# Patient Record
Sex: Female | Born: 1975 | Race: Black or African American | Hispanic: No | Marital: Married | Smoking: Former smoker
Health system: Southern US, Community
[De-identification: ages and names within clinical notes are randomized; demographics above are authoritative.]

## PROBLEM LIST (undated history)

## (undated) DIAGNOSIS — F419 Anxiety disorder, unspecified: Secondary | ICD-10-CM

## (undated) DIAGNOSIS — C50411 Malignant neoplasm of upper-outer quadrant of right female breast: Secondary | ICD-10-CM

## (undated) DIAGNOSIS — Z973 Presence of spectacles and contact lenses: Secondary | ICD-10-CM

## (undated) DIAGNOSIS — I10 Essential (primary) hypertension: Secondary | ICD-10-CM

## (undated) DIAGNOSIS — D649 Anemia, unspecified: Secondary | ICD-10-CM

## (undated) HISTORY — PX: UMBILICAL HERNIA REPAIR: SUR1181

## (undated) HISTORY — DX: Anxiety disorder, unspecified: F41.9

## (undated) HISTORY — PX: WISDOM TOOTH EXTRACTION: SHX21

## (undated) HISTORY — DX: Malignant neoplasm of upper-outer quadrant of right female breast: C50.411

---

## 2011-01-22 ENCOUNTER — Emergency Department (HOSPITAL_COMMUNITY)
Admission: EM | Admit: 2011-01-22 | Discharge: 2011-01-23 | Disposition: A | Discharge status: Home / Self Care | Payer: 59 | Attending: Emergency Medicine | Admitting: Emergency Medicine

## 2011-01-22 ENCOUNTER — Emergency Department (HOSPITAL_COMMUNITY): Payer: 59

## 2011-01-22 DIAGNOSIS — S3981XA Other specified injuries of abdomen, initial encounter: Secondary | ICD-10-CM | POA: Insufficient documentation

## 2011-01-22 DIAGNOSIS — R109 Unspecified abdominal pain: Secondary | ICD-10-CM | POA: Insufficient documentation

## 2011-01-22 DIAGNOSIS — X58XXXA Exposure to other specified factors, initial encounter: Secondary | ICD-10-CM | POA: Insufficient documentation

## 2011-01-22 DIAGNOSIS — Y9375 Activity, martial arts: Secondary | ICD-10-CM | POA: Insufficient documentation

## 2011-01-22 LAB — DIFFERENTIAL
Basophils Absolute: 0 10*3/uL (ref 0.0–0.1)
Eosinophils Relative: 2 % (ref 0–5)
Lymphocytes Relative: 27 % (ref 12–46)
Lymphs Abs: 3.1 10*3/uL (ref 0.7–4.0)
Monocytes Absolute: 0.5 10*3/uL (ref 0.1–1.0)
Monocytes Relative: 4 % (ref 3–12)
Neutro Abs: 7.6 10*3/uL (ref 1.7–7.7)

## 2011-01-22 LAB — CBC
HCT: 33.6 % — ABNORMAL LOW (ref 36.0–46.0)
Hemoglobin: 11.2 g/dL — ABNORMAL LOW (ref 12.0–15.0)
MCHC: 33.3 g/dL (ref 30.0–36.0)
MCV: 88.7 fL (ref 78.0–100.0)
RDW: 12.8 % (ref 11.5–15.5)

## 2011-01-23 ENCOUNTER — Encounter (HOSPITAL_COMMUNITY): Payer: Self-pay

## 2011-01-23 LAB — LIPASE, BLOOD: Lipase: 31 U/L (ref 11–59)

## 2011-01-23 LAB — COMPREHENSIVE METABOLIC PANEL
Alkaline Phosphatase: 55 U/L (ref 39–117)
BUN: 9 mg/dL (ref 6–23)
Creatinine, Ser: 0.72 mg/dL (ref 0.4–1.2)
GFR calc Af Amer: >60 mL/min (ref >60)
Glucose, Bld: 105 mg/dL — ABNORMAL HIGH (ref 70–99)
Potassium: 3.6 mEq/L (ref 3.5–5.1)
Total Bilirubin: 0.2 mg/dL — ABNORMAL LOW (ref 0.3–1.2)
Total Protein: 7 g/dL (ref 6.0–8.3)

## 2011-01-23 LAB — URINALYSIS, ROUTINE W REFLEX MICROSCOPIC
Bilirubin Urine: NEGATIVE
Glucose, UA: NEGATIVE mg/dL
Specific Gravity, Urine: 1.014 (ref 1.005–1.030)

## 2011-01-23 LAB — GC/CHLAMYDIA PROBE AMP, GENITAL
Chlamydia, DNA Probe: NEGATIVE
GC Probe Amp, Genital: NEGATIVE

## 2011-01-23 LAB — URINE MICROSCOPIC-ADD ON

## 2011-01-23 LAB — WET PREP, GENITAL: Yeast Wet Prep HPF POC: NONE SEEN

## 2011-01-23 MED ORDER — IOHEXOL 300 MG/ML  SOLN
100.0000 mL | Freq: Once | INTRAMUSCULAR | Status: AC | PRN
  Administered 2011-01-23: 100 mL via INTRAVENOUS

## 2011-01-24 LAB — URINE CULTURE

## 2012-07-02 IMAGING — CT CT ABD-PELV W/ CM
2 of 4 series · 17 of 46 positions shown, 19 images · IV contrast (agent unspecified)
Comparison: None.

CLINICAL DATA: Pelvic pain.  No injury.

CT ABDOMEN AND PELVIS WITH CONTRAST
TECHNIQUE: Multidetector CT imaging of the abdomen and pelvis was
performed following the standard protocol during bolus
administration of intravenous contrast.
Contrast: 100 ml 2mnipaque-GNN.

[Series 2: rtn ap with st · axial · 0.64mm/px · z∈[-456,-90]mm · 14 of 81 slices shown, 16 images]
[im 4/81  soft-tissue]
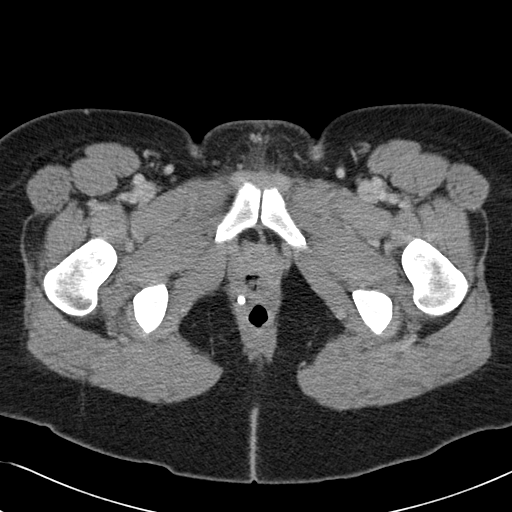
[im 4/81  bone]
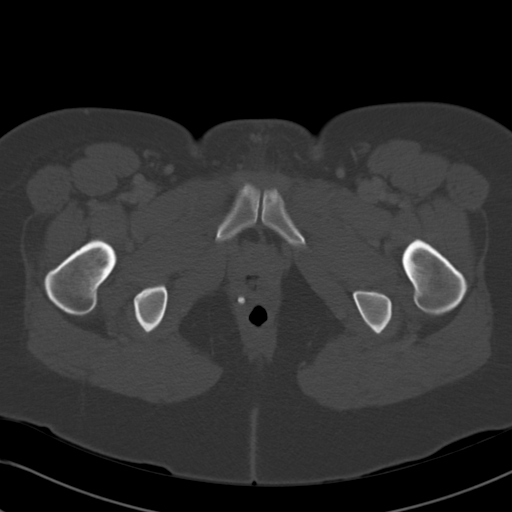
[im 10/81  soft-tissue]
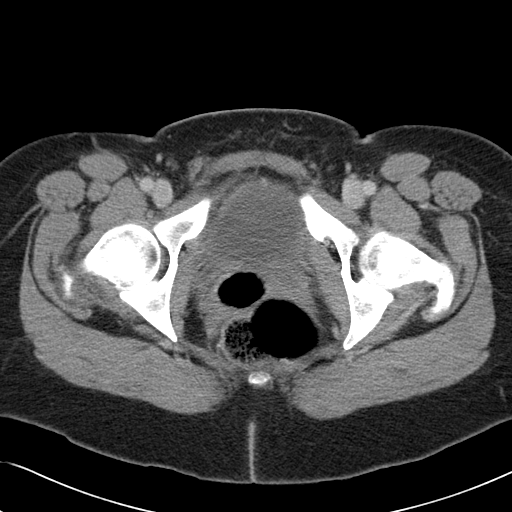
[im 17/81  soft-tissue]
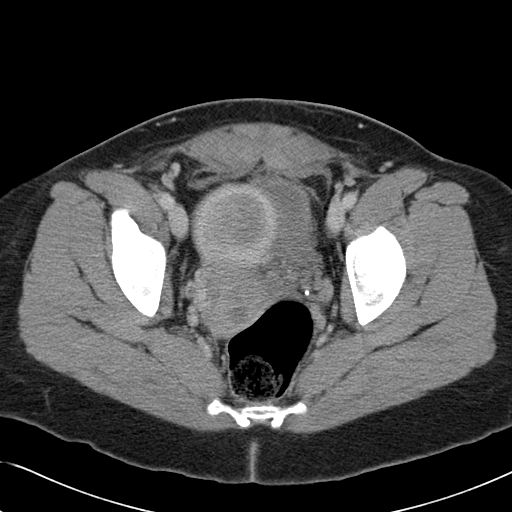
[im 23/81  soft-tissue]
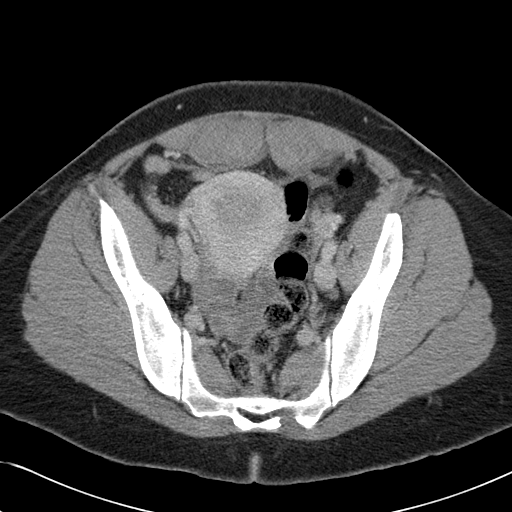
[im 26/81  soft-tissue]
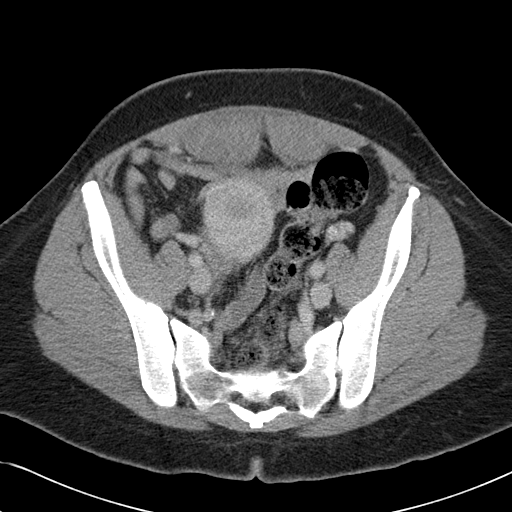
[im 33/81  soft-tissue]
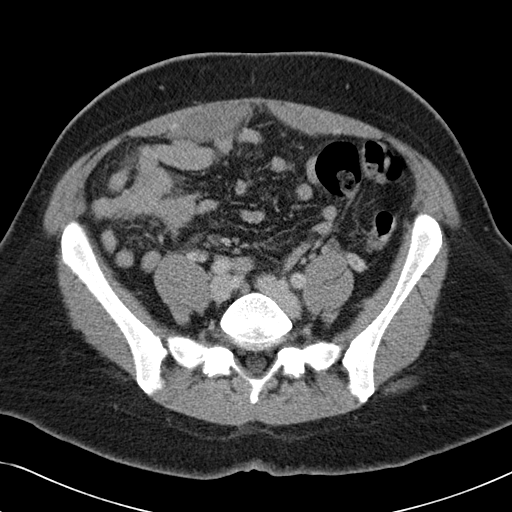
[im 39/81  soft-tissue]
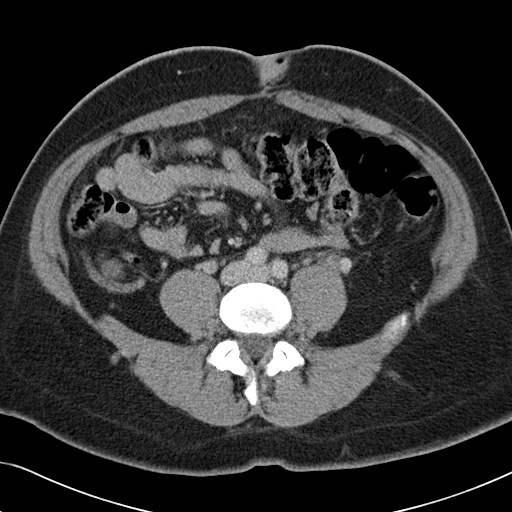
[im 42/81  soft-tissue]
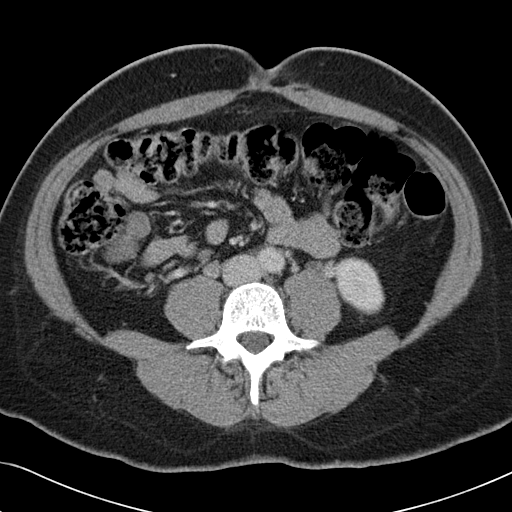
[im 49/81  soft-tissue]
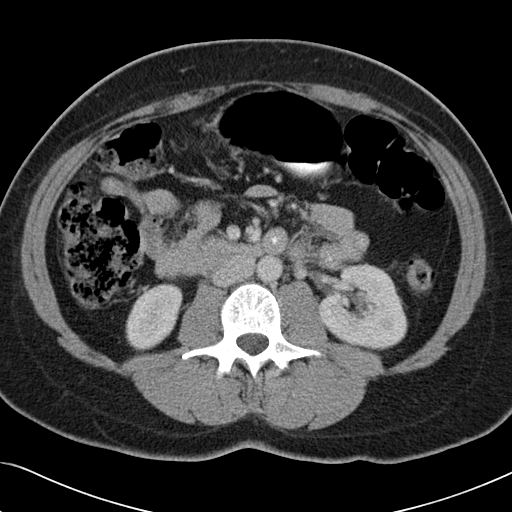
[im 49/81  bone]
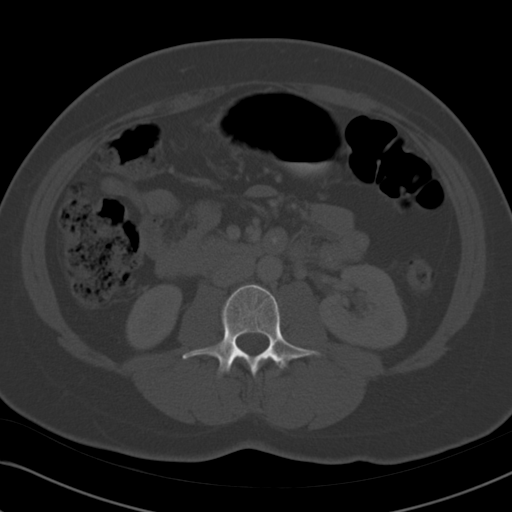
[im 55/81  soft-tissue]
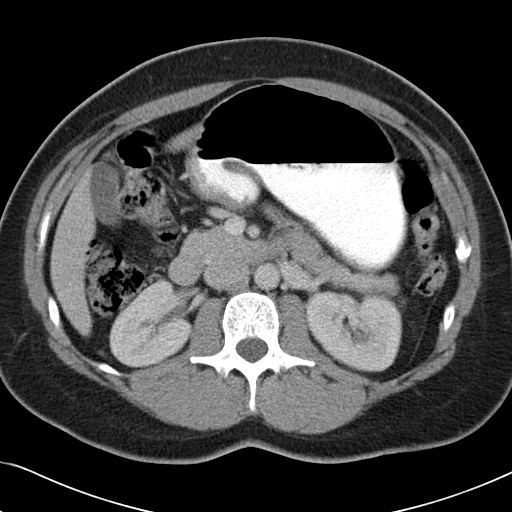
[im 61/81  soft-tissue]
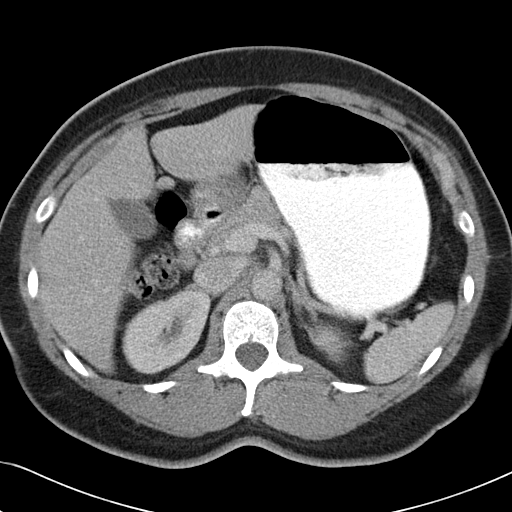
[im 65/81  soft-tissue]
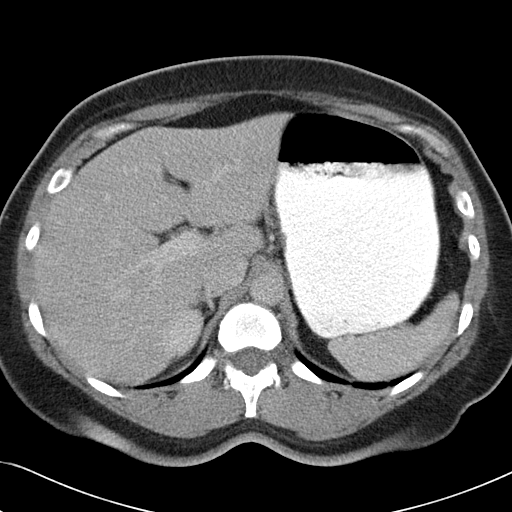
[im 71/81  soft-tissue]
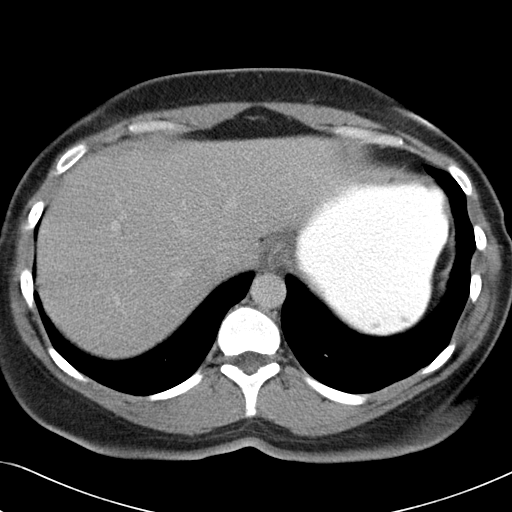
[im 77/81  soft-tissue]
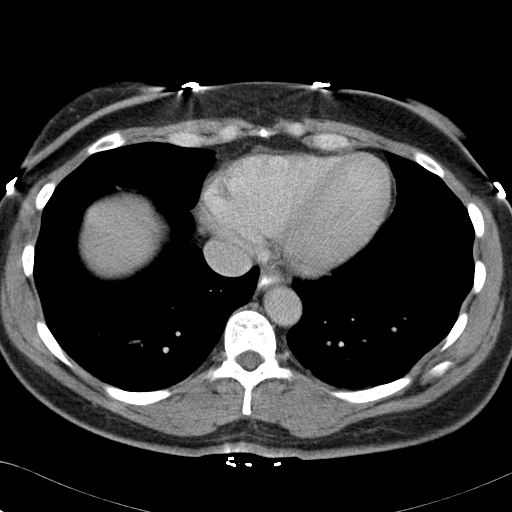

[Series 602: <mpr thick range> · coronal · 0.82mm/px · 3 of 81 slices shown]
[im 27/81  soft-tissue]
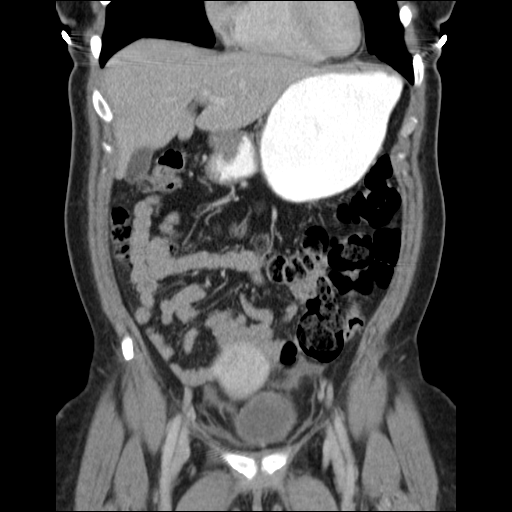
[im 36/81  soft-tissue]
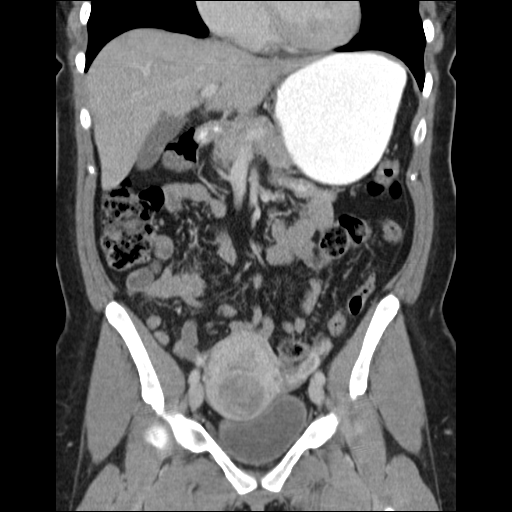
[im 45/81  soft-tissue]
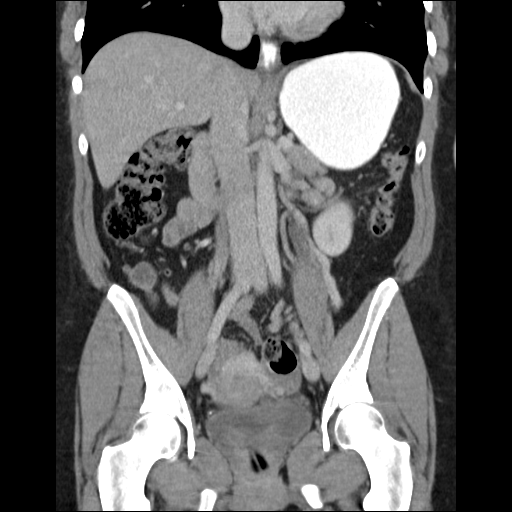

[17 of 46 positions shown; findings below may reference images not displayed]

FINDINGS: Dependent atelectasis at the lung bases.  Liver within
normal limits.  Gallbladder normal.  No calcified gallstones.
Pancreas and spleen unremarkable.  Normal adrenal glands.  Normal
renal enhancement.  The abdominal vasculature appears normal.
Proximal small bowel is normal.  Normal appendix in the right lower
quadrant.  Mild periumbilical fat stranding is present, possibly
representing scar.  Fibroid uterus.  Small amount of free fluid is
present in the anatomic pelvis, likely physiologic.  Sigmoid colon
is within normal limits.  Tampon is present within the vagina.

There is enlargement of the inferior rectus muscles bilaterally,
with stranding extending into the prevesical space.  Findings are
compatible with intramuscular hematoma with adjacent inflammatory
changes.  Bones appear within normal limits. Urinary bladder
normal.
IMPRESSION: 1.  Bilateral inferior rectus abdominus intramuscular hematoma is
with mild fat stranding extending into the prevesical space.
2.  Fibroid uterus.
3.  Thickening of the soft tissues in the region of the umbilicus,
likely representing scarring.

## 2014-03-27 ENCOUNTER — Encounter: Payer: Self-pay | Admitting: Internal Medicine

## 2014-03-27 ENCOUNTER — Ambulatory Visit (INDEPENDENT_AMBULATORY_CARE_PROVIDER_SITE_OTHER): Payer: Self-pay | Admitting: Internal Medicine

## 2014-03-27 VITALS — BP 152/90 | HR 95 | Temp 98.0°F | Resp 18 | Ht 65.0 in | Wt 199.0 lb

## 2014-03-27 DIAGNOSIS — R1013 Epigastric pain: Secondary | ICD-10-CM

## 2014-03-27 MED ORDER — RANITIDINE HCL 300 MG PO TABS
300.0000 mg | ORAL_TABLET | Freq: Every day | ORAL | Status: DC
Start: 2014-03-27 — End: 2015-02-06

## 2014-03-27 NOTE — Progress Notes (Signed)
   Subjective:    Patient ID: Barbara Myers, female    DOB: 10/24/1975, 38 y.o.   MRN: [id number]  HPI 38 year old female complaining of intermittent lower chest and upper abdominal pain for the past 48 hours. This woke her from sleep on Saturday morning and was a cramping discomfort that was not associated with shortness of breath, palpitations, diaphoresis, nausea, vomiting, diarrhea. She does not describe dyspepsia or reflux of acid. No history of heart disease or lung disease Family history of MI and mother She has been in good health recently although she works a stressful job and has been going to class III days a week in addition from 6 to 10 PM. It has been her habit recently to eating tending to directly to bed.   No illnesses/no medications    Review of Systems No fever chills or night sweats No weight loss No difficulty sleeping or except for not enough hours No change in activity level No dyspnea on exertion No GI or GU complaints No peripheral edema    Objective:   Physical Exam BP 152/90  Pulse 95  Temp(Src) 98 F (36.7 C) (Oral)  Resp 18  Ht 5\' 5"  (1.651 m)  Wt 199 lb (90.266 kg)  BMI 33.12 kg/m2  SpO2 99% She is anxious HEENT clear No thyromegaly or lymphadenopathy Chest is clear to auscultation Heart is regular with a 1/6 systolic murmur that disappears with deep inhalation The abdomen is soft nontender nondistended with no organomegaly or masses No peripheral edema Neurological intact Mood is good       Assessment & Plan:  Problem #1 epigastric cramping pain likely related to reflux because of eating at bedtime Problem #2 BMI 33 Problem #3 elevated blood pressure without diagnosis of hypertension  Plan-reassured No eating within 2-3 hours of bedtime Zantac 300 at bedtime for 3 weeks Recheck blood pressure when not anxious- outside world Long-term plan for weight loss needed

## 2014-07-18 ENCOUNTER — Encounter (HOSPITAL_COMMUNITY): Payer: Self-pay | Admitting: *Deleted

## 2014-07-19 ENCOUNTER — Ambulatory Visit (HOSPITAL_COMMUNITY)
Admission: RE | Admit: 2014-07-19 | Discharge: 2014-07-19 | Disposition: A | Discharge status: Home / Self Care | Payer: Medicaid Other | Source: Ambulatory Visit | Attending: Obstetrics and Gynecology | Admitting: Obstetrics and Gynecology

## 2014-07-19 ENCOUNTER — Encounter (HOSPITAL_COMMUNITY): Payer: Self-pay

## 2014-07-19 VITALS — BP 168/94 | Temp 98.9°F | Ht 64.0 in | Wt 198.0 lb

## 2014-07-19 DIAGNOSIS — Z1239 Encounter for other screening for malignant neoplasm of breast: Secondary | ICD-10-CM

## 2014-07-19 HISTORY — DX: Anemia, unspecified: D64.9

## 2014-07-19 NOTE — Progress Notes (Signed)
Patient referred to B[facility]CP by [facility] due to recommending a right breast biopsy. Diagnostic mammogram was completed 07/13/2014 at [facility]. Patient stated she noticed a right breast lump with dimpling around a month ago.  Pap Smear: Pap smear not completed today. Last Pap smear was 07/11/2014 at [facility] and normal with negative HPV. Per patient has no history of an abnormal Pap smear. No Pap smear results in EPIC.  Physical exam: Breasts Breasts symmetrical. No skin abnormalities left breast. Dimpling of right upper outer breast that is more noticeable when sitting up.. No nipple retraction bilateral breasts. No nipple discharge bilateral breasts. No lymphadenopathy. No lumps palpated left breast. Palpated a lump within the right breast at 11 o'clock 5 cm from the nipple. No complaints of pain or tenderness on exam. Referred patient to [facility] for a right breat biopsy per recommendation. Appointment scheduled for Thursday, July 20, 2014 at 1445.     Pelvic/Bimanual No Pap smear completed today since last Pap smear was 07/11/2014. Pap smear not indicated per BCCCP guidelines.

## 2014-07-19 NOTE — Patient Instructions (Signed)
Explained to Barbara Myers that she did not need a Pap smear today due to last Pap smear was 07/11/2014 per patient. Let her know that BCCCP will cover Pap smears and co-testing every 5 years unless has a history of abnormal Pap smears. Referred patient to [facility] for a right breat biopsy per recommendation. Appointment scheduled for Thursday, July 20, 2014 at 1445. Patient aware of appointment and will be there. Barbara Myers verbalized understanding.  Barbara Myers, Arvil Chaco, RN 3:43 PM

## 2014-07-20 ENCOUNTER — Other Ambulatory Visit: Payer: Self-pay | Admitting: Radiology

## 2014-07-21 ENCOUNTER — Other Ambulatory Visit: Payer: Self-pay | Admitting: Radiology

## 2014-07-21 DIAGNOSIS — C50911 Malignant neoplasm of unspecified site of right female breast: Secondary | ICD-10-CM

## 2014-07-24 ENCOUNTER — Telehealth: Payer: Self-pay | Admitting: *Deleted

## 2014-07-24 ENCOUNTER — Encounter: Payer: Self-pay | Admitting: *Deleted

## 2014-07-24 ENCOUNTER — Other Ambulatory Visit: Payer: Self-pay | Admitting: *Deleted

## 2014-07-24 DIAGNOSIS — C50411 Malignant neoplasm of upper-outer quadrant of right female breast: Secondary | ICD-10-CM | POA: Insufficient documentation

## 2014-07-24 HISTORY — DX: Malignant neoplasm of upper-outer quadrant of right female breast: C50.411

## 2014-07-24 NOTE — Telephone Encounter (Signed)
Left vm for pt to return call to discuss [facility] on 07/26/14. Contact information given.

## 2014-07-25 ENCOUNTER — Telehealth: Payer: Self-pay | Admitting: *Deleted

## 2014-07-25 NOTE — Telephone Encounter (Signed)
Barbara Myers from [place] confirmed with pt that [facility] is scheduled for 07/26/14 at 0830. I have left several messages for pt to return call to give further information concerning visit. Contact information given.

## 2014-07-25 NOTE — Telephone Encounter (Signed)
Confirmed [facility] for 07/26/14 at 0830.  Instructions and contact information given.

## 2014-07-26 ENCOUNTER — Ambulatory Visit (HOSPITAL_BASED_OUTPATIENT_CLINIC_OR_DEPARTMENT_OTHER): Payer: Medicaid Other | Admitting: Hematology and Oncology

## 2014-07-26 ENCOUNTER — Encounter (INDEPENDENT_AMBULATORY_CARE_PROVIDER_SITE_OTHER): Payer: Self-pay

## 2014-07-26 ENCOUNTER — Encounter: Payer: Self-pay | Admitting: Hematology and Oncology

## 2014-07-26 ENCOUNTER — Other Ambulatory Visit (INDEPENDENT_AMBULATORY_CARE_PROVIDER_SITE_OTHER): Payer: Self-pay | Admitting: General Surgery

## 2014-07-26 ENCOUNTER — Telehealth: Payer: Self-pay | Admitting: Hematology and Oncology

## 2014-07-26 ENCOUNTER — Encounter: Payer: Self-pay | Admitting: *Deleted

## 2014-07-26 ENCOUNTER — Other Ambulatory Visit (HOSPITAL_BASED_OUTPATIENT_CLINIC_OR_DEPARTMENT_OTHER): Payer: Medicaid Other

## 2014-07-26 ENCOUNTER — Ambulatory Visit
Admission: RE | Admit: 2014-07-26 | Discharge: 2014-07-26 | Disposition: A | Discharge status: Home / Self Care | Payer: Medicaid Other | Source: Ambulatory Visit | Attending: Radiation Oncology | Admitting: Radiation Oncology

## 2014-07-26 ENCOUNTER — Ambulatory Visit: Payer: 59

## 2014-07-26 VITALS — BP 164/91 | HR 59 | Temp 97.8°F | Resp 20 | Ht 65.0 in | Wt 192.7 lb

## 2014-07-26 DIAGNOSIS — C773 Secondary and unspecified malignant neoplasm of axilla and upper limb lymph nodes: Secondary | ICD-10-CM

## 2014-07-26 DIAGNOSIS — Z170 Estrogen receptor positive status [ER+]: Secondary | ICD-10-CM

## 2014-07-26 DIAGNOSIS — D500 Iron deficiency anemia secondary to blood loss (chronic): Secondary | ICD-10-CM

## 2014-07-26 DIAGNOSIS — C50411 Malignant neoplasm of upper-outer quadrant of right female breast: Secondary | ICD-10-CM

## 2014-07-26 DIAGNOSIS — N920 Excessive and frequent menstruation with regular cycle: Secondary | ICD-10-CM

## 2014-07-26 LAB — COMPREHENSIVE METABOLIC PANEL (CC13)
ALBUMIN: 3.9 g/dL (ref 3.5–5.0)
ALK PHOS: 46 U/L (ref 40–150)
ALT: 14 U/L (ref 0–55)
AST: 14 U/L (ref 5–34)
Anion Gap: 9 mEq/L (ref 3–11)
BUN: 7.2 mg/dL (ref 7.0–26.0)
CO2: 22 mEq/L (ref 22–29)
Calcium: 8.5 mg/dL (ref 8.4–10.4)
Chloride: 108 mEq/L (ref 98–109)
Creatinine: 0.8 mg/dL (ref 0.6–1.1)
GLUCOSE: 88 mg/dL (ref 70–140)
POTASSIUM: 3.8 meq/L (ref 3.5–5.1)
Sodium: 139 mEq/L (ref 136–145)
Total Bilirubin: 0.42 mg/dL (ref 0.20–1.20)
Total Protein: 6.8 g/dL (ref 6.4–8.3)

## 2014-07-26 LAB — CBC WITH DIFFERENTIAL/PLATELET
BASO%: 0.9 % (ref 0.0–2.0)
BASOS ABS: 0 10*3/uL (ref 0.0–0.1)
EOS ABS: 0.1 10*3/uL (ref 0.0–0.5)
EOS%: 1.3 % (ref 0.0–7.0)
HEMATOCRIT: 24.3 % — AB (ref 34.8–46.6)
HEMOGLOBIN: 6.9 g/dL — AB (ref 11.6–15.9)
LYMPH%: 28 % (ref 14.0–49.7)
MCH: 21.4 pg — ABNORMAL LOW (ref 25.1–34.0)
MCHC: 28.6 g/dL — ABNORMAL LOW (ref 31.5–36.0)
MCV: 74.9 fL — AB (ref 79.5–101.0)
MONO#: 0.3 10*3/uL (ref 0.1–0.9)
MONO%: 4.7 % (ref 0.0–14.0)
NEUT%: 65.1 % (ref 38.4–76.8)
NEUTROS ABS: 3.5 10*3/uL (ref 1.5–6.5)
PLATELETS: 598 10*3/uL — AB (ref 145–400)
RBC: 3.24 10*6/uL — ABNORMAL LOW (ref 3.70–5.45)
RDW: 33.4 % — AB (ref 11.2–14.5)
WBC: 5.4 10*3/uL (ref 3.9–10.3)
lymph#: 1.5 10*3/uL (ref 0.9–3.3)

## 2014-07-26 NOTE — Assessment & Plan Note (Signed)
Secondary to heavy menstrual bleeding. Patient received DepoProgesterone injection. I recommended doing ovarian suppression with Zoladex. Her hemoglobin today is 6.9 and hence I recommended giving IV iron so that her hemoglobin could improve by the time we get started with chemotherapy. She will stop oral iron treatments.

## 2014-07-26 NOTE — Progress Notes (Signed)
Barbara Myers is a very pleasant 38 y.o.Marland Kitchen female from [place], [place] with newly diagnosed grade 3 invasive ductal carcinoma of the right breast. Biopsy results revealed the tumor's hormone status as ER positive, PR positive, and HER2/neu equivocal. Ki67 is 40%.  A biopsy of a right axillary lymph node also revealed metastatic breast cancer.   She presents today with her husband to the [facility]) for treatment consideration and recommendations from the breast surgeon, radiation oncologist, and medical oncologist.     I briefly met with Barbara Myers and her husband during her [facility] visit today. We discussed the purpose of the Survivorship Clinic, which will include monitoring for recurrence, coordinating completion of age and gender-appropriate cancer screenings, promotion of overall wellness, as well as managing potential late/long-term side effects of anti-cancer treatments.     As of today, the treatment plan for Barbara Myers will likely include surgery, chemotherapy, and radiation therapy.  She will also meet with the Genetics Counselor due to her family history of breast cancer. The intent of treatment for Barbara Myers is cure, therefore she will be eligible for the Survivorship Clinic upon her completion of treatment.  Her survivorship care plan (SCP) document will be drafted and updated throughout the course of her treatment trajectory. She will receive the SCP in an office visit with myself in the Survivorship Clinic once she has completed treatment.   Barbara Myers was encouraged to ask questions and all questions were answered to her satisfaction.  She was given my business card and encouraged to contact me with any concerns regarding survivorship.  I look forward to  participating in her care.   Mike Craze, NP

## 2014-07-26 NOTE — Progress Notes (Signed)
Radiation Oncology         [phone]) [phone] ________________________________  Initial outpatient Consultation - Date: 07/26/2014   Name: Barbara Myers MRN: [id number]   DOB: 02/10/1976  REFERRING PHYSICIAN: Emelia Loron, MD  DIAGNOSIS:    ICD-9-CM ICD-10-CM   1. Breast cancer of upper-outer quadrant of right female breast 174.4 C50.411     STAGE: Breast cancer of upper-outer quadrant of right female breast   Staging form: Breast, AJCC 7th Edition     Clinical stage from 07/26/2014: Stage IIB (T2, N1, M0) - Unsigned       Staging comments: Staged at breast conference on 12.16.15  HISTORY OF PRESENT ILLNESS::Barbara Myers is a 38 y.o. female who felt a right breast mass. A 2.0 cm lesion was seen on ultrasound. A right axillary lymph node showed cortical thickening. MRI is pending. Biopsy showed Grade 3 invasive ductal cancer which was ER+PR+Her2 equivocal. Ki67 was 40%.  The right axillary lymph node was positive. She is doing well from her biopsy. She is accompanied by her husband. 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 She is GxP2 with her first birth at 80. She is still menstruating.   PREVIOUS RADIATION THERAPY: No  FAMILY HISTORY:  Family History  Problem Relation Age of Onset  . Hypertension Mother   . Breast cancer Maternal Grandmother   . Lung cancer Maternal Aunt     SOCIAL HISTORY:  History  Substance Use Topics  . Smoking status: Never Smoker   . Smokeless tobacco: Never Used  . Alcohol Use: Yes     Comment: ocassionally    REVIEW OF SYSTEMS:  A 15 point review of systems is documented in the electronic medical record. This was obtained by the nursing staff. However, I reviewed this with the patient to discuss relevant findings and make appropriate changes.  Pertinent positives are included in the chart.   PHYSICAL EXAM: There were no vitals filed for this visit.. . Pleasant female in no distress. No palpable cervical,  supraclavicular or axillary adenopathy. Palpable mass/biopsy change in the right breast (upper outer quadrant). No palpable masses in the left breast. She is alert and oriented.   IMPRESSION: T2N1 IDC of the right breast.  PLAN: I spoke to the patient today regarding her diagnosis and options for treatment. We discussed the equivalence in terms of survival and local failure between mastectomy and breast conservation. We discussed the role of radiation in decreasing local failures in patients who undergo lumpectomy. We discussed the process of simulation and the placement tattoos. We discussed 6 weeks of treatment as an outpatient. We discussed the possibility of asymptomatic lung damage. We discussed the low likelihood of secondary malignancies. We discussed the possible side effects including but not limited to skin redness, fatigue, permanent skin darkening, and breast swelling.    She is undergoing neoadjuvant treatment for breast cancer and will be a candidate for the lymph node studies looking at radiation for positive/negative sentinel nodes. I discussed this with her briefly and introduced the concept.   We are waiting on her HER2 results to be re-run on the breast specimen.  She met with our survivorship navigator, breast cancer navigators and patient/family support team.   I will see her back at the end of chemotherapy/pre surgery for discussion of clinical trials.   I spent 60 minutes  face to face with the patient and more than 50% of that time was spent in counseling and/or coordination of  care.   ------------------------------------------------  Thea Silversmith, MD

## 2014-07-26 NOTE — Progress Notes (Signed)
[facility] CONSULT NOTE  Patient Care Team: No Pcp Per Patient as PCP - General (General Practice) Rolm Bookbinder, MD as Consulting Physician (General Surgery) Rulon Eisenmenger, MD as Consulting Physician (Hematology and Oncology) Thea Silversmith, MD as Consulting Physician (Radiation Oncology) Trinda Pascal, NP as Nurse Practitioner (Nurse Practitioner)  CHIEF COMPLAINTS/PURPOSE OF CONSULTATION:  Newly diagnosed breast cancer  HISTORY OF PRESENTING ILLNESS:  Barbara Myers 38 y.o. female is here because of recent diagnosis of right breast cancer. Patient felt a palpable right breast mass in the upper outer quadrant which led to a mammogram and ultrasound that revealed her to Sinemet or mass effect 10:00 position. She also had a right enlarged axillary lymph node. Both a breast mass in the axilla were biopsied and both showed grade 3 invasive ductal carcinoma. The tumor was ER/PR positive HER-2 was equivocal with a Ki-67 of 40%. She has continued to underwent MRI on Friday. She was presented this morning in the multidisciplinary tumor board and she is here today to discuss the treatment options.  I reviewed her records extensively and collaborated the history with the patient.  SUMMARY OF ONCOLOGIC HISTORY:   Breast cancer of upper-outer quadrant of right female breast   07/13/2014 Mammogram indeterminate mass in the right breast by ultrasound measured 2 cm at 10:00 position   07/20/2014 Initial Diagnosis right breast biopsy: Invasive ductal carcinoma, 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, grade 3,ER/PR positive, HER-2 equivocal ratio 1.55, gene copy #4.35 (being repeated), Ki-67 40%    In terms of breast cancer risk profile:  She menarched at early age of 18  She had 2 pregnancy, her first child was born at age 27  She has received birth control pills for approximately few years.  She was never exposed to fertility medications  or hormone replacement therapy.  She has family history of Breast/GYN/GI cancer Maternal grandmother 76s with breast cancer in maternal aunt 65s with lung cancer  MEDICAL HISTORY:  Past Medical History  Diagnosis Date  . Anemia   . Breast cancer of upper-outer quadrant of right female breast 07/24/2014  . Anxiety     SURGICAL HISTORY: History reviewed. No pertinent past surgical history.  SOCIAL HISTORY: History   Social History  . Marital Status: Married    Spouse Name: N/A    Number of Children: N/A  . Years of Education: N/A   Occupational History  . Not on file.   Social History Main Topics  . Smoking status: Never Smoker   . Smokeless tobacco: Never Used  . Alcohol Use: Yes     Comment: ocassionally  . Drug Use: No  . Sexual Activity: Yes    Birth Control/ Protection: Injection   Other Topics Concern  . Not on file   Social History Narrative    FAMILY HISTORY: Family History  Problem Relation Age of Onset  . Hypertension Mother   . Breast cancer Maternal Grandmother   . Lung cancer Maternal Aunt     ALLERGIES:  has No Known Allergies.  MEDICATIONS:  Current Outpatient Prescriptions  Medication Sig Dispense Refill  . ferrous fumarate (HEMOCYTE - 106 MG FE) 325 (106 FE) MG TABS tablet Take 2 tablets by mouth daily.    . medroxyPROGESTERone (DEPO-PROVERA) 150 MG/ML injection Inject 150 mg into the muscle every 3 (three) months.    . ranitidine (ZANTAC) 300 MG tablet Take 1 tablet (300 mg total) by mouth at bedtime. 30 tablet 0  No current facility-administered medications for this visit.    REVIEW OF SYSTEMS:   Constitutional: Denies fevers, chills or abnormal night sweats Eyes: Denies blurriness of vision, double vision or watery eyes Ears, nose, mouth, throat, and face: Denies mucositis or sore throat Respiratory: Denies cough, dyspnea or wheezes Cardiovascular: Denies palpitation, chest discomfort or lower extremity swelling Gastrointestinal:   Denies nausea, heartburn or change in bowel habits Skin: Denies abnormal skin rashes Lymphatics: Denies new lymphadenopathy or easy bruising Neurological:Denies numbness, tingling or new weaknesses Behavioral/Psych: Mood is stable, no new changes  Breast: palpable lump All other systems were reviewed with the patient and are negative.  PHYSICAL EXAMINATION: ECOG PERFORMANCE STATUS: 0 - Asymptomatic  Filed Vitals:   07/26/14 0910  BP: 164/91  Pulse: 59  Temp: 97.8 F (36.6 C)  Resp: 20   Filed Weights   07/26/14 0910  Weight: 192 lb 11.2 oz (87.408 kg)    GENERAL:alert, no distress and comfortable SKIN: skin color, texture, turgor are normal, no rashes or significant lesions EYES: normal, conjunctiva are pink and non-injected, sclera clear OROPHARYNX:no exudate, no erythema and lips, buccal mucosa, and tongue normal  NECK: supple, thyroid normal size, non-tender, without nodularity LYMPH:  no palpable lymphadenopathy in the cervical, axillary or inguinal LUNGS: clear to auscultation and percussion with normal breathing effort HEART: regular rate & rhythm and no murmurs and no lower extremity edema ABDOMEN:abdomen soft, non-tender and normal bowel sounds Musculoskeletal:no cyanosis of digits and no clubbing  PSYCH: alert & oriented x 3 with fluent speech NEURO: no focal motor/sensory deficits BREAST:palpable lump in the right breast. No palpable axillary or supraclavicular lymphadenopathy  LABORATORY DATA:  I have reviewed the data as listed Lab Results  Component Value Date   WBC 5.4 07/26/2014   HGB 6.9* 07/26/2014   HCT 24.3* 07/26/2014   MCV 74.9* 07/26/2014   PLT 598* 07/26/2014   Lab Results  Component Value Date   NA 139 07/26/2014   K 3.8 07/26/2014   CL 104 01/22/2011   CO2 22 07/26/2014    RADIOGRAPHIC STUDIES: I have personally reviewed the radiological reports and agreed with the findings in the report. Results summarized as above  ASSESSMENT AND  PLAN:  Breast cancer of upper-outer quadrant of right female breast Right breast invasive ductal carcinoma 2 cm by ultrasound one axillary lymph node biopsy positive, ER positive, PR positive, HER-2 equivocal, grade 3, Ki-67 40%, T2 N1 M0 stage IIB clinical stage  Pathology and radiology counseling:Discussed with the patient, the details of pathology including the type of breast cancer,the clinical staging, the significance of ER, PR and HER-2/neu receptors and the implications for treatment. After reviewing the pathology in detail, we proceeded to discuss the different treatment options between surgery, radiation, chemotherapy, antiestrogen therapies.  Recommendation: 1. Neoadjuvant chemotherapy with dose dense Adriamycin and Cytoxan every 2 weeks for 4 treatments followed by Taxol weekly 12assuming that HER-2 is negative. 2. Patient will need echocardiogram, chemotherapy class, port placement ( to be done on 08/08/2014) 3. Plan to start chemotherapy 08/09/2014 4. Genetics counseling  Chemotherapy counseling:I have discussed the risks and benefits of chemotherapy including the risks of nausea/ vomiting, risk of infection from low WBC count, fatigue due to chemo or anemia, bruising or bleeding due to low platelets, mouth sores, loss/ change in taste and decreased appetite. Liver and kidney function will be monitored through out chemotherapy as abnormalities in liver and kidney function may be a side effect of treatment. Cardiac dysfunction due to Adriamycin  was discussed in detail. Risk of permanent bone marrow dysfunction and leukemia   I also offered her participation in PREVENT clinical trial PREVENT trial: [facility] [phone]  Newly diagnosed breast cancer Stages 1-3 scheduled to receive anthracycline randomized to atorvastatin 40 mg or placebo once daily for 24 months along with 3 cardiac MRIs or 2 years paid for by the trial. I discussed the risks and benefits of atorvastatin including the risk of  myopathy and elevation of liver function tests. I explained the randomization process and patient is interested in the trial. I provided her with literature to read and provided her with the contact with the clinical trials nurse to ask any questions regarding the trial.    Iron deficiency anemia due to chronic blood loss Secondary to heavy menstrual bleeding. Patient received DepoProgesterone injection. I recommended doing ovarian suppression with Zoladex. Her hemoglobin today is 6.9 and hence I recommended giving IV iron so that her hemoglobin could improve by the time we get started with chemotherapy. She will stop oral iron treatments.    Rulon Eisenmenger, MD 07/26/2014 12:19 PM

## 2014-07-26 NOTE — Assessment & Plan Note (Signed)
Right breast invasive ductal carcinoma 2 cm by ultrasound one axillary lymph node biopsy positive, ER positive, PR positive, HER-2 equivocal, grade 3, Ki-67 40%, T2 N1 M0 stage IIB clinical stage  Pathology and radiology counseling:Discussed with the patient, the details of pathology including the type of breast cancer,the clinical staging, the significance of ER, PR and HER-2/neu receptors and the implications for treatment. After reviewing the pathology in detail, we proceeded to discuss the different treatment options between surgery, radiation, chemotherapy, antiestrogen therapies.  Recommendation: 1. Neoadjuvant chemotherapy with dose dense Adriamycin and Cytoxan every 2 weeks for 4 treatments followed by Taxol weekly 12assuming that HER-2 is negative. 2. Patient will need echocardiogram, chemotherapy class, port placement ( to be done on 08/08/2014) 3. Plan to start chemotherapy 08/09/2014 4. Genetics counseling  Chemotherapy counseling:I have discussed the risks and benefits of chemotherapy including the risks of nausea/ vomiting, risk of infection from low WBC count, fatigue due to chemo or anemia, bruising or bleeding due to low platelets, mouth sores, loss/ change in taste and decreased appetite. Liver and kidney function will be monitored through out chemotherapy as abnormalities in liver and kidney function may be a side effect of treatment. Cardiac dysfunction due to Adriamycin was discussed in detail. Risk of permanent bone marrow dysfunction and leukemia   I also offered her participation in PREVENT clinical trial PREVENT trial: [id number]  Newly diagnosed breast cancer Stages 1-3 scheduled to receive anthracycline randomized to atorvastatin 40 mg or placebo once daily for 24 months along with 3 cardiac MRIs or 2 years paid for by the trial. I discussed the risks and benefits of atorvastatin including the risk of myopathy and elevation of liver function tests. I explained the  randomization process and patient is interested in the trial. I provided her with literature to read and provided her with the contact with the clinical trials nurse to ask any questions regarding the trial.   

## 2014-07-26 NOTE — Progress Notes (Signed)
Checked in new patient with no issues prior to seeing the dr. She will see Gabriel Cirri for BCCCP. She is also applying for comm insurance. I advised her of reg medicaid also. She will call Len[facility] if asst is needed. She has not traveled and has her appt card.

## 2014-07-26 NOTE — Telephone Encounter (Signed)
, °

## 2014-07-26 NOTE — Progress Notes (Signed)
[place] Psychosocial Distress Screening Clinical Social Work  Patient completed distress screening protocol and scored a 8 on the Psychosocial Distress Thermometer which indicates moderate distress. Clinical Social Worker met with patient and patients husband to assess for distress and other psychosocial needs.  Patient stated her distress level had decreased after meeting with the treatment team and getting more information on her treatment plan.  Patient identified financial stress and her children as her biggest concern.  Patient has 2 teenage children who are Autistic and patient has concerns for how they will respond to her diagnosis.  Patient expressed experiencing financial stress prior to her diagnosis and concerns for how treatment expenses will effect her.  CSW and patient discussed financial resources and the [employer].  Patient plans to contact the financial advocate to initiate the application process.  CSW encouraged patient to call once she receives her chemo class appointment to schedule a time to review additional resources with CSW.  CSW informed patient of the support team and support services at [facility], and patient was agreeable to an alight guide.  CSW encouraged patient to call with any additional questions or concerns.      ONCBCN DISTRESS SCREENING 07/26/2014  Screening Type Initial Screening  Distress experienced in past week (1-10) 8  Practical problem type Insurance;Work/school;Childcare  Family Problem type Children  Emotional problem type Nervousness/Anxiety;Adjusting to illness  Spiritual/Religous concerns type Loss of sense of purpose  Information Concerns Type Lack of info about diagnosis;Lack of info about treatment;Lack of info about complementary therapy choices  Physical Problem type Sleep/insomnia;Loss of appetitie  Physician notified of physical symptoms Yes  Referral to clinical social work Yes  Referral to financial advocate Yes  Referral to support  programs Yes

## 2014-07-27 ENCOUNTER — Telehealth: Payer: Self-pay | Admitting: *Deleted

## 2014-07-27 ENCOUNTER — Ambulatory Visit: Payer: Self-pay

## 2014-07-27 ENCOUNTER — Ambulatory Visit (HOSPITAL_BASED_OUTPATIENT_CLINIC_OR_DEPARTMENT_OTHER): Payer: Medicaid Other

## 2014-07-27 ENCOUNTER — Encounter: Payer: Self-pay | Admitting: *Deleted

## 2014-07-27 ENCOUNTER — Encounter: Payer: Self-pay | Admitting: Hematology and Oncology

## 2014-07-27 DIAGNOSIS — C773 Secondary and unspecified malignant neoplasm of axilla and upper limb lymph nodes: Secondary | ICD-10-CM

## 2014-07-27 DIAGNOSIS — C50411 Malignant neoplasm of upper-outer quadrant of right female breast: Secondary | ICD-10-CM

## 2014-07-27 DIAGNOSIS — D500 Iron deficiency anemia secondary to blood loss (chronic): Secondary | ICD-10-CM

## 2014-07-27 DIAGNOSIS — Z5111 Encounter for antineoplastic chemotherapy: Secondary | ICD-10-CM

## 2014-07-27 DIAGNOSIS — N920 Excessive and frequent menstruation with regular cycle: Secondary | ICD-10-CM

## 2014-07-27 MED ORDER — GOSERELIN ACETATE 3.6 MG ~~LOC~~ IMPL
3.6000 mg | DRUG_IMPLANT | Freq: Once | SUBCUTANEOUS | Status: AC
  Administered 2014-07-27: 3.6 mg via SUBCUTANEOUS
  Filled 2014-07-27: qty 3.6

## 2014-07-27 MED ORDER — FERUMOXYTOL INJECTION 510 MG/17 ML
510.0000 mg | Freq: Once | INTRAVENOUS | Status: AC
  Administered 2014-07-27: 510 mg via INTRAVENOUS
  Filled 2014-07-27: qty 17

## 2014-07-27 NOTE — Progress Notes (Signed)
Pt is approved for the $1000 Alight grant.  

## 2014-07-27 NOTE — Progress Notes (Signed)
Monitored patient 30 minutes post Feraheme infusion with no complications.

## 2014-07-27 NOTE — Telephone Encounter (Signed)
Spoke to pt concerning [place] from 07/26/14. Informed pt of negative her2neu result on breast specimen from core bx. Pt denies questions or concerns regarding dx or treatment care plan. Confirmed port and future appt. Encourage pt to call with needs. Received verbal understanding. Contact information given.

## 2014-07-27 NOTE — Patient Instructions (Addendum)
Ferumoxytol injection What is this medicine? FERUMOXYTOL is an iron complex. Iron is used to make healthy red blood cells, which carry oxygen and nutrients throughout the body. This medicine is used to treat iron deficiency anemia in people with chronic kidney disease. This medicine may be used for other purposes; ask your health care provider or pharmacist if you have questions. COMMON BRAND NAME(S): Feraheme What should I tell my health care provider before I take this medicine? They need to know if you have any of these conditions: -anemia not caused by low iron levels -high levels of iron in the blood -magnetic resonance imaging (MRI) test scheduled -an unusual or allergic reaction to iron, other medicines, foods, dyes, or preservatives -pregnant or trying to get pregnant -breast-feeding How should I use this medicine? This medicine is for injection into a vein. It is given by a health care professional in a hospital or clinic setting. Talk to your pediatrician regarding the use of this medicine in children. Special care may be needed. Overdosage: If you think you've taken too much of this medicine contact a poison control center or emergency room at once. Overdosage: If you think you have taken too much of this medicine contact a poison control center or emergency room at once. NOTE: This medicine is only for you. Do not share this medicine with others. What if I miss a dose? It is important not to miss your dose. Call your doctor or health care professional if you are unable to keep an appointment. What may interact with this medicine? This medicine may interact with the following medications: -other iron products This list may not describe all possible interactions. Give your health care provider a list of all the medicines, herbs, non-prescription drugs, or dietary supplements you use. Also tell them if you smoke, drink alcohol, or use illegal drugs. Some items may interact with your  medicine. What should I watch for while using this medicine? Visit your doctor or healthcare professional regularly. Tell your doctor or healthcare professional if your symptoms do not start to get better or if they get worse. You may need blood work done while you are taking this medicine. You may need to follow a special diet. Talk to your doctor. Foods that contain iron include: whole grains/cereals, dried fruits, beans, or peas, leafy green vegetables, and organ meats (liver, kidney). What side effects may I notice from receiving this medicine? Side effects that you should report to your doctor or health care professional as soon as possible: -allergic reactions like skin rash, itching or hives, swelling of the face, lips, or tongue -breathing problems -changes in blood pressure -feeling faint or lightheaded, falls -fever or chills -flushing, sweating, or hot feelings -swelling of the ankles or feet Side effects that usually do not require medical attention (Report these to your doctor or health care professional if they continue or are bothersome.): -diarrhea -headache -nausea, vomiting -stomach pain This list may not describe all possible side effects. Call your doctor for medical advice about side effects. You may report side effects to FDA at 1-800-FDA-1088. Where should I keep my medicine? This drug is given in a hospital or clinic and will not be stored at home. NOTE: This sheet is a summary. It may not cover all possible information. If you have questions about this medicine, talk to your doctor, pharmacist, or health care provider.  2015, Elsevier/Gold Standard. (2012-03-12 15:23:36) Goserelin injection What is this medicine? GOSERELIN (GOE se rel in) is similar to  a hormone found in the body. It lowers the amount of sex hormones that the body makes. Men will have lower testosterone levels and women will have lower estrogen levels while taking this medicine. In men, this  medicine is used to treat prostate cancer; the injection is either given once per month or once every 12 weeks. A once per month injection (only) is used to treat women with endometriosis, dysfunctional uterine bleeding, or advanced breast cancer. This medicine may be used for other purposes; ask your health care provider or pharmacist if you have questions. COMMON BRAND NAME(S): Zoladex What should I tell my health care provider before I take this medicine? They need to know if you have any of these conditions (some only apply to women): -diabetes -heart disease or previous heart attack -high blood pressure -high cholesterol -kidney disease -osteoporosis or low bone density -problems passing urine -spinal cord injury -stroke -tobacco smoker -an unusual or allergic reaction to goserelin, hormone therapy, other medicines, foods, dyes, or preservatives -pregnant or trying to get pregnant -breast-feeding How should I use this medicine? This medicine is for injection under the skin. It is given by a health care professional in a hospital or clinic setting. Men receive this injection once every 4 weeks or once every 12 weeks. Women will only receive the once every 4 weeks injection. Talk to your pediatrician regarding the use of this medicine in children. Special care may be needed. Overdosage: If you think you have taken too much of this medicine contact a poison control center or emergency room at once. NOTE: This medicine is only for you. Do not share this medicine with others. What if I miss a dose? It is important not to miss your dose. Call your doctor or health care professional if you are unable to keep an appointment. What may interact with this medicine? -female hormones like estrogen -herbal or dietary supplements like black cohosh, chasteberry, or DHEA -female hormones like testosterone -prasterone This list may not describe all possible interactions. Give your health care provider  a list of all the medicines, herbs, non-prescription drugs, or dietary supplements you use. Also tell them if you smoke, drink alcohol, or use illegal drugs. Some items may interact with your medicine. What should I watch for while using this medicine? Visit your doctor or health care professional for regular checks on your progress. Your symptoms may appear to get worse during the first weeks of this therapy. Tell your doctor or healthcare professional if your symptoms do not start to get better or if they get worse after this time. Your bones may get weaker if you take this medicine for a long time. If you smoke or frequently drink alcohol you may increase your risk of bone loss. A family history of osteoporosis, chronic use of drugs for seizures (convulsions), or corticosteroids can also increase your risk of bone loss. Talk to your doctor about how to keep your bones strong. This medicine should stop regular monthly menstration in women. Tell your doctor if you continue to [facility]. Women should not become pregnant while taking this medicine or for 12 weeks after stopping this medicine. Women should inform their doctor if they wish to become pregnant or think they might be pregnant. There is a potential for serious side effects to an unborn child. Talk to your health care professional or pharmacist for more information. Do not breast-feed an infant while taking this medicine. Men should inform their doctors if they wish to father a  child. This medicine may lower sperm counts. Talk to your health care professional or pharmacist for more information. What side effects may I notice from receiving this medicine? Side effects that you should report to your doctor or health care professional as soon as possible: -allergic reactions like skin rash, itching or hives, swelling of the face, lips, or tongue -bone pain -breathing problems -changes in vision -chest pain -feeling faint or lightheaded,  falls -fever, chills -pain, swelling, warmth in the leg -pain, tingling, numbness in the hands or feet -signs and symptoms of low blood pressure like dizziness; feeling faint or lightheaded, falls; unusually weak or tired -stomach pain -swelling of the ankles, feet, hands -trouble passing urine or change in the amount of urine -unusually high or low blood pressure -unusually weak or tired Side effects that usually do not require medical attention (report to your doctor or health care professional if they continue or are bothersome): -change in sex drive or performance -changes in breast size in both males and females -changes in emotions or moods -headache -hot flashes -irritation at site where injected -loss of appetite -skin problems like acne, dry skin -vaginal dryness This list may not describe all possible side effects. Call your doctor for medical advice about side effects. You may report side effects to FDA at 1-800-FDA-1088. Where should I keep my medicine? This drug is given in a hospital or clinic and will not be stored at home. NOTE: This sheet is a summary. It may not cover all possible information. If you have questions about this medicine, talk to your doctor, pharmacist, or health care provider.  2015, Elsevier/Gold Standard. (2013-10-04 11:10:35)

## 2014-07-27 NOTE — Progress Notes (Signed)
MD note create during office visit - copy to pt original to scan.

## 2014-07-28 ENCOUNTER — Other Ambulatory Visit: Payer: No Typology Code available for payment source

## 2014-07-28 ENCOUNTER — Other Ambulatory Visit: Payer: Self-pay | Admitting: *Deleted

## 2014-07-28 ENCOUNTER — Other Ambulatory Visit: Payer: Self-pay | Admitting: Hematology and Oncology

## 2014-07-28 ENCOUNTER — Ambulatory Visit (HOSPITAL_COMMUNITY)
Admission: RE | Admit: 2014-07-28 | Discharge: 2014-07-28 | Disposition: A | Discharge status: Home / Self Care | Payer: Medicaid Other | Source: Ambulatory Visit | Attending: Hematology and Oncology | Admitting: Hematology and Oncology

## 2014-07-28 ENCOUNTER — Other Ambulatory Visit: Payer: Self-pay | Admitting: Radiology

## 2014-07-28 ENCOUNTER — Encounter: Payer: No Typology Code available for payment source | Admitting: *Deleted

## 2014-07-28 ENCOUNTER — Ambulatory Visit
Admission: RE | Admit: 2014-07-28 | Discharge: 2014-07-28 | Disposition: A | Discharge status: Home / Self Care | Payer: No Typology Code available for payment source | Source: Ambulatory Visit | Attending: Radiology | Admitting: Radiology

## 2014-07-28 ENCOUNTER — Telehealth: Payer: Self-pay

## 2014-07-28 DIAGNOSIS — C50411 Malignant neoplasm of upper-outer quadrant of right female breast: Secondary | ICD-10-CM

## 2014-07-28 DIAGNOSIS — C50911 Malignant neoplasm of unspecified site of right female breast: Secondary | ICD-10-CM

## 2014-07-28 DIAGNOSIS — I517 Cardiomegaly: Secondary | ICD-10-CM

## 2014-07-28 DIAGNOSIS — C50919 Malignant neoplasm of unspecified site of unspecified female breast: Secondary | ICD-10-CM | POA: Diagnosis not present

## 2014-07-28 LAB — RESEARCH LABS

## 2014-07-28 MED ORDER — GADOBENATE DIMEGLUMINE 529 MG/ML IV SOLN: 18.0000 mL | Freq: Once | INTRAVENOUS | Status: AC | PRN

## 2014-07-28 NOTE — Telephone Encounter (Signed)
Office note dtd 07/26/14 rcd from [place].  Copy to Dr Lindi Adie.  Original to scan.  -

## 2014-07-28 NOTE — Telephone Encounter (Signed)
Office notes dtd 07/11/14 rcvd from [employer].  Reviewed by Dr Lindi Adie.  Sent to scan.

## 2014-07-28 NOTE — Progress Notes (Signed)
  Echocardiogram 2D Echocardiogram has been performed.  Barbara Myers FRANCES 07/28/2014, 12:51 PM

## 2014-07-31 ENCOUNTER — Encounter: Payer: Self-pay | Admitting: Genetic Counselor

## 2014-07-31 ENCOUNTER — Ambulatory Visit
Admission: RE | Admit: 2014-07-31 | Discharge: 2014-07-31 | Disposition: A | Discharge status: Home / Self Care | Payer: No Typology Code available for payment source | Source: Ambulatory Visit | Attending: Radiology | Admitting: Radiology

## 2014-07-31 ENCOUNTER — Encounter: Payer: Self-pay | Admitting: Hematology and Oncology

## 2014-07-31 ENCOUNTER — Other Ambulatory Visit: Payer: Self-pay

## 2014-07-31 ENCOUNTER — Encounter: Payer: No Typology Code available for payment source | Admitting: *Deleted

## 2014-07-31 ENCOUNTER — Ambulatory Visit (HOSPITAL_BASED_OUTPATIENT_CLINIC_OR_DEPARTMENT_OTHER): Payer: Medicaid Other | Admitting: Genetic Counselor

## 2014-07-31 DIAGNOSIS — C50411 Malignant neoplasm of upper-outer quadrant of right female breast: Secondary | ICD-10-CM

## 2014-07-31 DIAGNOSIS — Z803 Family history of malignant neoplasm of breast: Secondary | ICD-10-CM

## 2014-07-31 DIAGNOSIS — C50911 Malignant neoplasm of unspecified site of right female breast: Secondary | ICD-10-CM

## 2014-07-31 NOTE — Progress Notes (Signed)
REFERRING PROVIDER: Nicholas Lose, MD  PRIMARY PROVIDER:  No PCP Per Patient  PRIMARY REASON FOR VISIT:  1. Breast cancer of upper-outer quadrant of right female breast   2. Family history of breast cancer      HISTORY OF PRESENT ILLNESS:   Barbara Myers, a 38 y.o. female, was seen for a [place] cancer genetics consultation at the request of Dr. Nicholas Lose due to a personal and family history of cancer.  Barbara Myers presents to clinic today to discuss the possibility of a hereditary predisposition to cancer, genetic testing, and to further clarify her future cancer risks, as well as potential cancer risks for family members.   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 This will treated with chemotherapy, surgery and radiaiton.  She is unsure if she will be put on tamoxifen.   HORMONAL RISK FACTORS:  Menarche was at age 89.  First live birth at age 54.  OCP use for approximately 3 years.  Ovaries intact: yes.  Hysterectomy: no.  Menopausal status: premenopausal.  HRT use: 0 years. Colonoscopy: no; not examined. Mammogram within the last year: yes. Number of breast biopsies: 1. Up to date with pelvic exams:  yes. Any excessive radiation exposure in the past:  no  Past Medical History  Diagnosis Date  . Anemia   . Breast cancer of upper-outer quadrant of right female breast 07/24/2014  . Anxiety     History reviewed. No pertinent past  surgical history.  History   Social History  . Marital Status: Married    Spouse Name: N/A    Number of Children: N/A  . Years of Education: N/A   Social History Main Topics  . Smoking status: Former Smoker -- 0.01 packs/day    Types: Cigarettes    Quit date: 07/31/1994  . Smokeless tobacco: Never Used  . Alcohol Use: Yes     Comment: ocassionally  . Drug Use: No  . Sexual Activity: Yes    Birth Control/ Protection: Injection   Other Topics Concern  . None   Social History Narrative     FAMILY HISTORY:  We obtained a detailed, 4-generation family history.  Significant diagnoses are listed below: Family History  Problem Relation Age of Onset  . Hypertension Mother   . Breast cancer Maternal Grandmother     dx in her 48s  . Lung cancer Maternal Aunt     dx in her 15s; smoker  . Prostate cancer Paternal Grandfather     Patient's maternal ancestors are of African American descent, and paternal ancestors are of African American descent. There is no reported Ashkenazi Jewish ancestry. There is no known consanguinity.  GENETIC COUNSELING ASSESSMENT: Barbara Myers is a 38 y.o. female with a personal and family history of breast cancer which somewhat suggestive of a hereditary cancer syndrome and predisposition to cancer. We, therefore, discussed and recommended the following at today's visit.   DISCUSSION: We reviewed the characteristics, features and inheritance patterns of  hereditary cancer syndromes. We reviewed hereditary cancer syndromes caused by BRCA mutations as well as other hereditary breast cancer genes.  We also discussed genetic testing, including the appropriate family members to test, the process of testing, insurance coverage and turn-around-time for results. We discussed the implications of a negative, positive and/or variant of uncertain significant result. We recommended Barbara Myers pursue genetic testing for the OvaNExt gene panel. The genes on this panel  include: ATM, BARD1, BRCA1, BRCA2, BRIP1, CDH1, CHEK2, EPCAM, MLH1, MRE11A, MSH2, MSH6, MUTYH, NBN, NF1, PALB2, PMS2, PTEN, RAD50, RAD51C, RAD51D, SMARCA4, STK11, and TP53.  Barbara Myers is currently on the [facility] program but has applied for [facility] Medicaid.  We have put a hold on her testing until she receives her Medicaid card.  PLAN: After considering the risks, benefits, and limitations,Barbara Myers  provided informed consent to pursue genetic testing and the blood sample was sent to [facility] for analysis of the [place]. Results should be available within approximately 4 weeks' time, at which point they will be disclosed by telephone to Barbara Myers, as will any additional recommendations warranted by these results. Barbara Myers will receive a summary of her genetic counseling visit and a copy of her results once available. This information will also be available in Epic. We encouraged Barbara Myers to remain in contact with cancer genetics annually so that we can continuously update the family history and inform her of any changes in cancer genetics and testing that may be of benefit for her family. Barbara Myers questions were answered to her satisfaction today. Our contact information was provided should additional questions or concerns arise.  Lastly, we encouraged Barbara Myers to remain in contact with cancer genetics annually so that we can continuously update the family history and inform her of any changes in cancer genetics and testing that may be of benefit for this family.   [name] questions were answered to her satisfaction today. Our contact information was provided should additional questions or concerns arise. Thank you for the referral and allowing [place] to share in the care of your patient.   Karen P. Florene Glen, [place], [facility] Certified Genetic Counselor [email or web address] phone: [phone]  The patient was seen for a total of 60 minutes in face-to-face genetic counseling.   This patient was discussed with Drs. Magrinat, Lindi Adie and/or Burr Medico who agrees with the above.    _______________________________________________________________________ For Office Staff:  Number of people involved in session: 1 Was an Intern/ student involved with case: no

## 2014-08-01 ENCOUNTER — Encounter: Payer: Self-pay | Admitting: *Deleted

## 2014-08-01 ENCOUNTER — Encounter: Payer: No Typology Code available for payment source | Admitting: *Deleted

## 2014-08-01 ENCOUNTER — Other Ambulatory Visit: Payer: No Typology Code available for payment source

## 2014-08-01 NOTE — Progress Notes (Signed)
08/01/2014  1045 Patient was successfully registered onto the [place] PREVENT study. Met with patient after chemo education class.  Completed the waist measurement and TAS form.  Patient stated she is currently not taking any medication.  She denies taking any supplements, vitamins, herbs, or grapefruit juice.  She confirmed that she understands risk of treatment on unborn fetus and confirms she is going to be compliant with birth control method to include either abstinence or barrier method.   QOL questionnaires were given to patient per [facility].  Reviewed current schedule with patient and she will return on 08/03/14.  Study drug to be dispensed at that time by this RN.  MRI reimbursement form given to V. Armed forces training and education officer, Therapist, sports.  Spoke with V. Lovena Le at [facility] to confirm patient will not be billed for the reading of MRI.  MRI patient handout was given to the patient.  She verbalized understanding and was without questions.  Patient understands her cardiac MRI will be performed 08/07/14 after the baseline study labs, including Hct, are obtained. She denies any metal in her body, except breast metal clip, or having h/o claustrophobia.  08/02/2014  1415 Received 2 bottles of study drug from [employer].  Study drug was delivered to [facility], Pharmacist.  Treatment orders in [facility], [place] and pending MD signature.  Patient will come in on 08/03/14 and will be dispensed study drug and medication diaries to begin on 08/07/14.

## 2014-08-02 ENCOUNTER — Encounter: Payer: Self-pay | Admitting: *Deleted

## 2014-08-02 ENCOUNTER — Other Ambulatory Visit: Payer: Self-pay | Admitting: *Deleted

## 2014-08-02 ENCOUNTER — Other Ambulatory Visit: Payer: Self-pay | Admitting: Hematology and Oncology

## 2014-08-02 ENCOUNTER — Encounter (HOSPITAL_BASED_OUTPATIENT_CLINIC_OR_DEPARTMENT_OTHER): Payer: Self-pay | Admitting: *Deleted

## 2014-08-02 NOTE — Progress Notes (Signed)
Had labs done

## 2014-08-03 ENCOUNTER — Ambulatory Visit (HOSPITAL_BASED_OUTPATIENT_CLINIC_OR_DEPARTMENT_OTHER): Payer: Medicaid Other

## 2014-08-03 ENCOUNTER — Encounter: Payer: Self-pay | Admitting: *Deleted

## 2014-08-03 DIAGNOSIS — D500 Iron deficiency anemia secondary to blood loss (chronic): Secondary | ICD-10-CM

## 2014-08-03 DIAGNOSIS — C50411 Malignant neoplasm of upper-outer quadrant of right female breast: Secondary | ICD-10-CM

## 2014-08-03 DIAGNOSIS — N920 Excessive and frequent menstruation with regular cycle: Secondary | ICD-10-CM

## 2014-08-03 MED ORDER — SODIUM CHLORIDE 0.9 % IV SOLN
510.0000 mg | Freq: Once | INTRAVENOUS | Status: AC
  Administered 2014-08-03: 510 mg via INTRAVENOUS
  Filled 2014-08-03: qty 17

## 2014-08-03 MED ORDER — INV-ATORVASTATIN/PLACEBO 40 MG TABS WAKE FOREST WF 98213: 1.0000 | ORAL_TABLET | Freq: Every day | ORAL | Status: DC

## 2014-08-03 MED ORDER — SODIUM CHLORIDE 0.9 % IV SOLN
Freq: Once | INTRAVENOUS | Status: AC
  Administered 2014-08-03: 09:00:00 via INTRAVENOUS

## 2014-08-03 NOTE — Patient Instructions (Signed)

## 2014-08-03 NOTE — Progress Notes (Signed)
08/03/2014 0930  [facility] PREVENT Pharmacist reviewed, checked in, and assigned study pills to patient.  The pt was given her study drug bottle (6 months supply) for self administration by this RN. The pt was instructed to start taking 1 pill a day with or without food on Monday December 28th. The pt knows that she is only to take 1 pill from the bottle a day. The research nurse reviewed with the pt the most common adverse events related to atorvastatin, including risks to unborn fetus and the need to continue birth control regimen discussed with Dr. Lindi Adie. The pt knows that this is a blinded study, and we do not know if she is on atorvastatin or placebo. The pt was encouraged to call Dr. Lindi Adie  if she develops any new side effects after starting study drug. She verbalized understanding on how to take study drug and when to start it. She was given a December medication diary, and  was instructed to complete it daily starting on Monday, December 28th. The pt is aware that she needs to take 2 doses of the study drug before starting chemotherapy on Wednesday, December 30th. The pt stated that she would take 1 pill on Monday December 28th and another on Tuesday December 29th. The pt understands she should  take the medication at the same time each day. She was told that if she misses a day of study drug then she needs to mark her medication diary with a "0" for that date. The pt was told to keep all medication diaries until the research nurse requests them so that they can be forwarded to the study base. The pt had no questions about the study drug, side effects, or how to take it.Marlon Pel, Research  RN, aware of above and will meet with patient on 08/09/14 to confirm patient started taking the study drug.

## 2014-08-07 ENCOUNTER — Other Ambulatory Visit (HOSPITAL_COMMUNITY): Payer: Self-pay | Admitting: Radiology

## 2014-08-07 ENCOUNTER — Other Ambulatory Visit (HOSPITAL_COMMUNITY): Payer: Self-pay | Admitting: Hematology and Oncology

## 2014-08-07 ENCOUNTER — Ambulatory Visit: Payer: Self-pay

## 2014-08-07 ENCOUNTER — Other Ambulatory Visit: Payer: Self-pay

## 2014-08-07 ENCOUNTER — Ambulatory Visit (HOSPITAL_COMMUNITY)
Admission: RE | Admit: 2014-08-07 | Discharge: 2014-08-07 | Disposition: A | Discharge status: Home / Self Care | Payer: Medicaid Other | Source: Ambulatory Visit | Attending: Hematology and Oncology | Admitting: Hematology and Oncology

## 2014-08-07 DIAGNOSIS — Z87891 Personal history of nicotine dependence: Secondary | ICD-10-CM | POA: Diagnosis not present

## 2014-08-07 DIAGNOSIS — C50911 Malignant neoplasm of unspecified site of right female breast: Secondary | ICD-10-CM | POA: Diagnosis present

## 2014-08-07 DIAGNOSIS — C50411 Malignant neoplasm of upper-outer quadrant of right female breast: Secondary | ICD-10-CM | POA: Diagnosis not present

## 2014-08-08 ENCOUNTER — Ambulatory Visit (HOSPITAL_BASED_OUTPATIENT_CLINIC_OR_DEPARTMENT_OTHER): Payer: Medicaid Other | Admitting: Anesthesiology

## 2014-08-08 ENCOUNTER — Ambulatory Visit (HOSPITAL_COMMUNITY): Payer: Medicaid Other

## 2014-08-08 ENCOUNTER — Other Ambulatory Visit: Payer: Self-pay | Admitting: Hematology and Oncology

## 2014-08-08 ENCOUNTER — Ambulatory Visit (HOSPITAL_BASED_OUTPATIENT_CLINIC_OR_DEPARTMENT_OTHER)
Admission: RE | Admit: 2014-08-08 | Discharge: 2014-08-08 | Disposition: A | Discharge status: Home / Self Care | Payer: Medicaid Other | Source: Ambulatory Visit | Attending: General Surgery | Admitting: General Surgery

## 2014-08-08 ENCOUNTER — Encounter (HOSPITAL_BASED_OUTPATIENT_CLINIC_OR_DEPARTMENT_OTHER)
Admission: RE | Disposition: A | Discharge status: Home / Self Care | Payer: Self-pay | Source: Ambulatory Visit | Attending: General Surgery

## 2014-08-08 ENCOUNTER — Encounter (HOSPITAL_BASED_OUTPATIENT_CLINIC_OR_DEPARTMENT_OTHER): Payer: Self-pay | Admitting: *Deleted

## 2014-08-08 ENCOUNTER — Other Ambulatory Visit: Payer: Self-pay

## 2014-08-08 ENCOUNTER — Telehealth: Payer: Self-pay

## 2014-08-08 DIAGNOSIS — Z87891 Personal history of nicotine dependence: Secondary | ICD-10-CM | POA: Insufficient documentation

## 2014-08-08 DIAGNOSIS — C50411 Malignant neoplasm of upper-outer quadrant of right female breast: Secondary | ICD-10-CM | POA: Insufficient documentation

## 2014-08-08 DIAGNOSIS — Z95828 Presence of other vascular implants and grafts: Secondary | ICD-10-CM

## 2014-08-08 HISTORY — PX: PORTACATH PLACEMENT: SHX2246

## 2014-08-08 HISTORY — DX: Presence of spectacles and contact lenses: Z97.3

## 2014-08-08 LAB — POCT HEMOGLOBIN-HEMACUE: HEMOGLOBIN: 9.3 g/dL — AB (ref 12.0–15.0)

## 2014-08-08 SURGERY — INSERTION, TUNNELED CENTRAL VENOUS DEVICE, WITH PORT
Anesthesia: General | Site: Chest

## 2014-08-08 MED ORDER — BUPIVACAINE HCL (PF) 0.25 % IJ SOLN
INTRAMUSCULAR | Status: AC
  Filled 2014-08-08: qty 30

## 2014-08-08 MED ORDER — MIDAZOLAM HCL 2 MG/2ML IJ SOLN: 1.0000 mg | INTRAMUSCULAR | Status: DC | PRN

## 2014-08-08 MED ORDER — ONDANSETRON HCL 4 MG/2ML IJ SOLN: 4.0000 mg | Freq: Once | INTRAMUSCULAR | Status: DC | PRN

## 2014-08-08 MED ORDER — HYDROMORPHONE HCL 1 MG/ML IJ SOLN
INTRAMUSCULAR | Status: AC
  Filled 2014-08-08: qty 1

## 2014-08-08 MED ORDER — DEXAMETHASONE SODIUM PHOSPHATE 4 MG/ML IJ SOLN
INTRAMUSCULAR | Status: DC | PRN
  Administered 2014-08-08: 10 mg via INTRAVENOUS

## 2014-08-08 MED ORDER — HEPARIN (PORCINE) IN NACL 2-0.9 UNIT/ML-% IJ SOLN
INTRAMUSCULAR | Status: AC
  Filled 2014-08-08: qty 500

## 2014-08-08 MED ORDER — PROCHLORPERAZINE MALEATE 10 MG PO TABS: 10.0000 mg | ORAL_TABLET | Freq: Four times a day (QID) | ORAL | Status: DC | PRN

## 2014-08-08 MED ORDER — LACTATED RINGERS IV SOLN
INTRAVENOUS | Status: DC
  Administered 2014-08-08: 12:00:00 via INTRAVENOUS

## 2014-08-08 MED ORDER — FENTANYL CITRATE 0.05 MG/ML IJ SOLN
INTRAMUSCULAR | Status: DC | PRN
  Administered 2014-08-08: 50 ug via INTRAVENOUS
  Administered 2014-08-08: 100 ug via INTRAVENOUS

## 2014-08-08 MED ORDER — HYDROMORPHONE HCL 1 MG/ML IJ SOLN
0.2500 mg | INTRAMUSCULAR | Status: DC | PRN
  Administered 2014-08-08 (×2): 0.5 mg via INTRAVENOUS

## 2014-08-08 MED ORDER — DEXAMETHASONE 4 MG PO TABS: ORAL_TABLET | ORAL | Status: DC

## 2014-08-08 MED ORDER — ONDANSETRON HCL 8 MG PO TABS: 8.0000 mg | ORAL_TABLET | Freq: Two times a day (BID) | ORAL | Status: DC | PRN

## 2014-08-08 MED ORDER — HEPARIN SOD (PORK) LOCK FLUSH 100 UNIT/ML IV SOLN
INTRAVENOUS | Status: AC
  Filled 2014-08-08: qty 5

## 2014-08-08 MED ORDER — BUPIVACAINE HCL (PF) 0.25 % IJ SOLN
INTRAMUSCULAR | Status: DC | PRN
  Administered 2014-08-08: 10 mL

## 2014-08-08 MED ORDER — CEFAZOLIN SODIUM-DEXTROSE 2-3 GM-% IV SOLR
2.0000 g | INTRAVENOUS | Status: AC
  Administered 2014-08-08: 2 g via INTRAVENOUS

## 2014-08-08 MED ORDER — CEFAZOLIN SODIUM-DEXTROSE 2-3 GM-% IV SOLR
INTRAVENOUS | Status: AC
  Filled 2014-08-08: qty 50

## 2014-08-08 MED ORDER — FENTANYL CITRATE 0.05 MG/ML IJ SOLN: 50.0000 ug | INTRAMUSCULAR | Status: DC | PRN

## 2014-08-08 MED ORDER — HEPARIN (PORCINE) IN NACL 2-0.9 UNIT/ML-% IJ SOLN
INTRAMUSCULAR | Status: DC | PRN
  Administered 2014-08-08: 1 via INTRAVENOUS

## 2014-08-08 MED ORDER — HEPARIN SOD (PORK) LOCK FLUSH 100 UNIT/ML IV SOLN
INTRAVENOUS | Status: DC | PRN
  Administered 2014-08-08: 500 [IU] via INTRAVENOUS

## 2014-08-08 MED ORDER — OXYCODONE-ACETAMINOPHEN 10-325 MG PO TABS: 1.0000 | ORAL_TABLET | Freq: Four times a day (QID) | ORAL | Status: DC | PRN

## 2014-08-08 MED ORDER — LIDOCAINE-PRILOCAINE 2.5-2.5 % EX CREA: TOPICAL_CREAM | CUTANEOUS | Status: DC

## 2014-08-08 MED ORDER — LIDOCAINE HCL (CARDIAC) 20 MG/ML IV SOLN
INTRAVENOUS | Status: DC | PRN
  Administered 2014-08-08: 70 mg via INTRAVENOUS

## 2014-08-08 MED ORDER — FENTANYL CITRATE 0.05 MG/ML IJ SOLN
INTRAMUSCULAR | Status: AC
  Filled 2014-08-08: qty 4

## 2014-08-08 MED ORDER — LORAZEPAM 0.5 MG PO TABS: 0.5000 mg | ORAL_TABLET | Freq: Four times a day (QID) | ORAL | Status: DC | PRN

## 2014-08-08 MED ORDER — LIDOCAINE 4 % EX CREA
TOPICAL_CREAM | CUTANEOUS | Status: AC
  Filled 2014-08-08: qty 5

## 2014-08-08 MED ORDER — ONDANSETRON HCL 4 MG/2ML IJ SOLN
INTRAMUSCULAR | Status: DC | PRN
  Administered 2014-08-08: 4 mg via INTRAVENOUS

## 2014-08-08 MED ORDER — PROPOFOL 10 MG/ML IV BOLUS
INTRAVENOUS | Status: DC | PRN
  Administered 2014-08-08: 160 mg via INTRAVENOUS

## 2014-08-08 MED ORDER — MIDAZOLAM HCL 5 MG/5ML IJ SOLN
INTRAMUSCULAR | Status: DC | PRN
Start: 2014-08-08 — End: 2014-08-08
  Administered 2014-08-08: 2 mg via INTRAVENOUS

## 2014-08-08 MED ORDER — MIDAZOLAM HCL 2 MG/2ML IJ SOLN
INTRAMUSCULAR | Status: AC
  Filled 2014-08-08: qty 2

## 2014-08-08 SURGICAL SUPPLY — 49 items
BAG DECANTER FOR FLEXI CONT (MISCELLANEOUS) ×3 IMPLANT
BENZOIN TINCTURE PRP APPL 2/3 (GAUZE/BANDAGES/DRESSINGS) ×3 IMPLANT
BLADE SURG 11 STRL SS (BLADE) ×3 IMPLANT
BLADE SURG 15 STRL LF DISP TIS (BLADE) ×1 IMPLANT
BLADE SURG 15 STRL SS (BLADE) ×2
CANISTER SUCT 1200ML W/VALVE (MISCELLANEOUS) IMPLANT
CHLORAPREP W/TINT 26ML (MISCELLANEOUS) ×3 IMPLANT
CLOSURE WOUND 1/2 X4 (GAUZE/BANDAGES/DRESSINGS) ×1
COVER BACK TABLE 60X90IN (DRAPES) ×3 IMPLANT
COVER MAYO STAND STRL (DRAPES) ×3 IMPLANT
DECANTER SPIKE VIAL GLASS SM (MISCELLANEOUS) ×3 IMPLANT
DRAPE C-ARM 42X72 X-RAY (DRAPES) ×3 IMPLANT
DRAPE LAPAROSCOPIC ABDOMINAL (DRAPES) ×3 IMPLANT
DRSG TEGADERM 4X4.75 (GAUZE/BANDAGES/DRESSINGS) ×9 IMPLANT
ELECT COATED BLADE 2.86 ST (ELECTRODE) ×3 IMPLANT
ELECT REM PT RETURN 9FT ADLT (ELECTROSURGICAL) ×3
ELECTRODE REM PT RTRN 9FT ADLT (ELECTROSURGICAL) ×1 IMPLANT
GAUZE SPONGE 4X4 12PLY STRL (GAUZE/BANDAGES/DRESSINGS) ×3 IMPLANT
GLOVE BIO SURGEON STRL SZ7 (GLOVE) ×3 IMPLANT
GLOVE BIOGEL PI IND STRL 7.5 (GLOVE) ×1 IMPLANT
GLOVE BIOGEL PI INDICATOR 7.5 (GLOVE) ×2
GOWN STRL REUS W/ TWL LRG LVL3 (GOWN DISPOSABLE) ×4 IMPLANT
GOWN STRL REUS W/TWL LRG LVL3 (GOWN DISPOSABLE) ×8
IV KIT MINILOC 20X1 SAFETY (NEEDLE) IMPLANT
KIT PORT POWER 8FR ISP CVUE (Catheter) ×3 IMPLANT
LIQUID BAND (GAUZE/BANDAGES/DRESSINGS) ×3 IMPLANT
MARKER SKIN DUAL TIP RULER LAB (MISCELLANEOUS) ×3 IMPLANT
NDL SAFETY ECLIPSE 18X1.5 (NEEDLE) IMPLANT
NEEDLE HYPO 18GX1.5 SHARP (NEEDLE)
NEEDLE HYPO 25X1 1.5 SAFETY (NEEDLE) ×3 IMPLANT
PACK BASIN DAY SURGERY FS (CUSTOM PROCEDURE TRAY) ×3 IMPLANT
PENCIL BUTTON HOLSTER BLD 10FT (ELECTRODE) ×3 IMPLANT
SLEEVE SCD COMPRESS KNEE MED (MISCELLANEOUS) ×3 IMPLANT
SPONGE GAUZE 4X4 12PLY STER LF (GAUZE/BANDAGES/DRESSINGS) ×3 IMPLANT
STAPLER VISISTAT 35W (STAPLE) ×3 IMPLANT
STRIP CLOSURE SKIN 1/2X4 (GAUZE/BANDAGES/DRESSINGS) ×2 IMPLANT
SUT MON AB 4-0 PC3 18 (SUTURE) ×3 IMPLANT
SUT PROLENE 2 0 SH DA (SUTURE) ×3 IMPLANT
SUT SILK 2 0 TIES 17X18 (SUTURE)
SUT SILK 2-0 18XBRD TIE BLK (SUTURE) IMPLANT
SUT VIC AB 3-0 SH 27 (SUTURE) ×2
SUT VIC AB 3-0 SH 27X BRD (SUTURE) ×1 IMPLANT
SYR 5ML LUER SLIP (SYRINGE) ×3 IMPLANT
SYR CONTROL 10ML LL (SYRINGE) ×3 IMPLANT
TOWEL OR 17X24 6PK STRL BLUE (TOWEL DISPOSABLE) ×3 IMPLANT
TOWEL OR NON WOVEN STRL DISP B (DISPOSABLE) ×3 IMPLANT
TUBE CONNECTING 20'X1/4 (TUBING)
TUBE CONNECTING 20X1/4 (TUBING) IMPLANT
YANKAUER SUCT BULB TIP NO VENT (SUCTIONS) IMPLANT

## 2014-08-08 NOTE — Op Note (Signed)
Preoperative diagnosis: Stage II breast cancer Postoperative diagnosis: Same as above Procedure: Left subclavian port placement Surgeon: Dr. Serita Grammes Anesthesia: Gen. 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 She was seen in our multidisciplinary clinic and we have elected to begin her on primary chemotherapy. We discussed port placement prior to beginning.  Procedure: After informed consent was obtained the patient was taken the operating room. She was given cefazolin. Sequential compression devices were on her legs. She was placed under general anesthesia without complication. A roll was placed between her shoulder blades. She was then prepped and draped in the standard sterile surgical fashion. A surgical timeout was then performed.  I infiltrated her left chest with lidocaine. I then accessed her subclavian vein on the third pass. The wire went into the correct position. This was confirmed by fluoroscopy. I then proceeded to make an incision below this and a pocket overlying the pectoralis fascia. I then tunneled the line between the 2 sites. I dilated the tract and then placed the peel-away sheath. The peel-away sheath due to the fact that I was lateral just barely went into what appeared to be the superior vena cava. I passed the line and it appeared to travel to the other subclavian vein. I removed the peel-away sheath. I then placed the wire back in through the line and guided the line down into the superior vena cava. Once I done this I removed the wire. The end appeared to be in the superior vena cava. I then attached the port. This flushed easily and aspirated blood. I then sutured this in position with 2-0 Prolene suture. I then closed this  with 3-0 Vicryl and 4-0 Monocryl. Dermabond was placed over this. I then accessed the port. It aspirated blood and flushed easily. Heparin was placed in the port. I then placed a dressing and left this accessed as she is going to begin chemotherapy tomorrow. She was then extubated and transferred to recovery stable.

## 2014-08-08 NOTE — H&P (Signed)
55 yof who is otherwise healthy and presents after noting a right breast mas about a month ago. Has not noted change since then. No nipple discharge. She has a family history in a maternal grandmother. She works as Personal assistant. She lives with her husband and her two special needs sons ages 55 and 61. She underwent a mm with density of C. There is by [place] and mm a 2 cm irregular mass in the right upper outer quadrant. There was also an irregular node in the right axilla that has been biopsied. 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 The node was positive for carcinoma also. the her2 was done on the node. She has no other complaints today.   History Rolm Bookbinder, MD; 07/26/2014 2:34 PM) Note:PMH: none PSH: uh as child Meds iron NKDA SH: nonsmoker occ etoh  Review of Systems Rolm Bookbinder MD; 07/26/2014 2:23 PM) General Not Present- Appetite Loss, Chills, Fatigue, Fever, Night Sweats, Weight Gain and Weight Loss. Skin Not Present- Change in Wart/Mole, Dryness, Hives, Jaundice, New Lesions, Non-Healing Wounds, Rash and Ulcer. HEENT Not Present- Earache, Hearing Loss, Hoarseness, Nose Bleed, Oral Ulcers, Ringing in the Ears, Seasonal Allergies, Sinus Pain, Sore Throat, Visual Disturbances, Wears glasses/contact lenses and Yellow Eyes. Respiratory Not Present- Bloody sputum, Chronic Cough, Difficulty Breathing, Snoring and Wheezing. Breast Present- Breast Mass. Not Present- Breast Pain, Nipple Discharge and Skin Changes. Cardiovascular Not Present- Chest Pain, Difficulty Breathing Lying Down, Leg Cramps, Palpitations, Rapid Heart Rate, Shortness of Breath and Swelling of Extremities. Gastrointestinal Not Present- Abdominal Pain, Bloating, Bloody Stool, Change in Bowel Habits, Chronic diarrhea, Constipation, Difficulty Swallowing, Excessive gas, Gets full quickly at meals, Hemorrhoids, Indigestion,  Nausea, Rectal Pain and Vomiting. Female Genitourinary Not Present- Frequency, Nocturia, Painful Urination, Pelvic Pain and Urgency. Musculoskeletal Not Present- Back Pain, Joint Pain, Joint Stiffness, Muscle Pain, Muscle Weakness and Swelling of Extremities. Neurological Not Present- Decreased Memory, Fainting, Headaches, Numbness, Seizures, Tingling, Tremor, Trouble walking and Weakness. Psychiatric Not Present- Anxiety, Bipolar, Change in Sleep Pattern, Depression, Fearful and Frequent crying. Endocrine Not Present- Cold Intolerance, Excessive Hunger, Hair Changes, Heat Intolerance, Hot flashes and New Diabetes. Hematology Not Present- Easy Bruising, Excessive bleeding, Gland problems, HIV and Persistent Infections.   Physical Exam Rolm Bookbinder MD; 07/26/2014 2:26 PM) General Mental Status-Alert. Orientation-Oriented X3.  Chest and Lung Exam Chest and lung exam reveals -on auscultation, normal breath sounds, no adventitious sounds and normal vocal resonance.  Breast Nipples-No Discharge. Breast - Left-Normal. Breast - Right-Dimpling. Note: 2 cm upper outer quadrant breast mass, mobile nontender with some dimpling overyling this    Cardiovascular Cardiovascular examination reveals -normal heart sounds, regular rate and rhythm with no murmurs.  Lymphatic Head & Neck  General Head & Neck Lymphatics: Bilateral - Description - Normal. Axillary  General Axillary Region: Bilateral - Description - Normal. Note: no [place] adenopathy     Assessment & Plan Rolm Bookbinder MD; 07/26/2014 2:32 PM) CANCER OF UPPER-OUTER QUADRANT OF FEMALE BREAST (174.4  C50.419) Impression: Will await MRI and genetic testing results Plan for primary chemotherapy  We discussed at length staging and pathophysiology of breast cancer. She will get chemotherapy and I think doing this first has several advantages. It will likely reduce the size of tumor although she could get  lumpectomy now. We will also likely be able to enroll her in the alliance trial for sn biopsy after chemotherapy first  hopefully avoiding her an axillary dissection. This will also give time for genetic testing. I think she will have option of lumpectomy vs mastectomy at the time of surgery which will occur several weeks after chemotherapy. We discussed port placement for chemotherapy. risks and benefits were discussed. Will proceed with port placement soon. Would like to see her back halfway through chemotherapy. will do her 2 testing on the tumor and await that result. I will call with mri results when available also

## 2014-08-08 NOTE — Discharge Instructions (Signed)
PORT-A-CATH: POST OP INSTRUCTIONS  Always review your discharge instruction sheet given to you by the facility where your surgery was performed.  DO not remove dressing until you see the nurses at cancer center tomorrow. They will remove prior to chemotherapy.  1. A prescription for pain medication may be given to you upon discharge. Take your pain medication as prescribed, if needed. If narcotic pain medicine is not needed, then you make take acetaminophen (Tylenol) or ibuprofen (Advil) as needed.  2. Take your usually prescribed medications unless otherwise directed. 3. If you need a refill on your pain medication, please contact our office. All narcotic pain medicine now requires a paper prescription.  Phoned in and fax refills are no longer allowed by law.  Prescriptions will not be filled after 5 pm or on weekends.  4. You should follow a light diet for the remainder of the day after your procedure. 5. Most patients will experience some mild swelling and/or bruising in the area of the incision. It may take several days to resolve. 6. It is common to experience some constipation if taking pain medication after surgery. Increasing fluid intake and taking a stool softener (such as Colace) will usually help or prevent this problem from occurring. A mild laxative (Milk of Magnesia or Miralax) should be taken according to package directions if there are no bowel movements after 48 hours.  7. Unless discharge instructions indicate otherwise, you may remove your bandages 48 hours after surgery, and you may shower at that time. You may have steri-strips (small white skin tapes) in place directly over the incision.  These strips should be left on the skin for 7-10 days.  If your surgeon used Dermabond (skin glue) on the incision, you may shower in 24 hours.  The glue will flake off over the next 2-3 weeks.  8. If your port is left accessed at the end of surgery (needle left in port), the dressing cannot  get wet and should only by changed by a healthcare professional. When the port is no longer accessed (when the needle has been removed), follow step 7.   9. ACTIVITIES:  Limit activity involving your arms for the next 72 hours. Do no strenuous exercise or activity for 1 week. You may drive when you are no longer taking prescription pain medication, you can comfortably wear a seatbelt, and you can maneuver your car. 10.You may need to see your doctor in the office for a follow-up appointment.  Please       check with your doctor.  11.When you receive a new Port-a-Cath, you will get a product guide and        ID card.  Please keep them in case you need them.  WHEN TO CALL YOUR DOCTOR [phone]): 1. Fever over 101.0 2. Chills 3. Continued bleeding from incision 4. Increased redness and tenderness at the site 5. Shortness of breath, difficulty breathing   The clinic staff is available to answer your questions during regular business hours. Please dont hesitate to call and ask to speak to one of the nurses or medical assistants for clinical concerns. If you have a medical emergency, go to the nearest emergency room or call 911.  A surgeon from [facility] is always on call at the hospital.     For further information, please visit [email or web address]   Post Anesthesia Home Care Instructions  Activity: Get plenty of rest for the remainder of the day. A responsible adult should stay  with you for 24 hours following the procedure.  For the next 24 hours, DO NOT: -Drive a car -Paediatric nurse -Drink alcoholic beverages -Take any medication unless instructed by your physician -Make any legal decisions or sign important papers.  Meals: Start with liquid foods such as gelatin or soup. Progress to regular foods as tolerated. Avoid greasy, spicy, heavy foods. If nausea and/or vomiting occur, drink only clear liquids until the nausea and/or vomiting subsides. Call  your physician if vomiting continues.  Special Instructions/Symptoms: Your throat may feel dry or sore from the anesthesia or the breathing tube placed in your throat during surgery. If this causes discomfort, gargle with warm salt water. The discomfort should disappear within 24 hours.

## 2014-08-08 NOTE — Transfer of Care (Signed)
Immediate Anesthesia Transfer of Care Note  Patient: Barbara Myers  Procedure(s) Performed: Procedure(s): INSERTION PORT-A-CATH (N/A)  Patient Location: PACU  Anesthesia Type:General  Level of Consciousness: sedated  Airway & Oxygen Therapy: Patient Spontanous Breathing and Patient connected to face mask oxygen  Post-op Assessment: Report given to PACU RN and Post -op Vital signs reviewed and stable  Post vital signs: Reviewed and stable  Complications: No apparent anesthesia complications

## 2014-08-08 NOTE — Anesthesia Procedure Notes (Signed)
Procedure Name: LMA Insertion Date/Time: 08/08/2014 12:32 PM Performed by: Maryella Shivers Pre-anesthesia Checklist: Patient identified, Emergency Drugs available, Suction available and Patient being monitored Patient Re-evaluated:Patient Re-evaluated prior to inductionOxygen Delivery Method: Circle System Utilized Preoxygenation: Pre-oxygenation with 100% oxygen Intubation Type: IV induction Ventilation: Mask ventilation without difficulty LMA: LMA inserted LMA Size: 4.0 Number of attempts: 1 Airway Equipment and Method: bite block Placement Confirmation: positive ETCO2 Tube secured with: Tape Dental Injury: Teeth and Oropharynx as per pre-operative assessment

## 2014-08-08 NOTE — Anesthesia Preprocedure Evaluation (Signed)
Anesthesia Evaluation  Patient identified by MRN, date of birth, ID band Patient awake    Reviewed: Allergy & Precautions, H&P , NPO status , Patient's Chart, lab work & pertinent test results  Airway        Dental   Pulmonary former smoker,          Cardiovascular     Neuro/Psych    GI/Hepatic   Endo/Other    Renal/GU      Musculoskeletal   Abdominal   Peds  Hematology   Anesthesia Other Findings Breast CA  Reproductive/Obstetrics                             Anesthesia Physical Anesthesia Plan  ASA: I  Anesthesia Plan: General   Post-op Pain Management:    Induction: Intravenous  Airway Management Planned: LMA  Additional Equipment:   Intra-op Plan:   Post-operative Plan: Extubation in OR  Informed Consent: I have reviewed the patients History and Physical, chart, labs and discussed the procedure including the risks, benefits and alternatives for the proposed anesthesia with the patient or authorized representative who has indicated his/her understanding and acceptance.     Plan Discussed with: CRNA, Anesthesiologist and Surgeon  Anesthesia Plan Comments:         Anesthesia Quick Evaluation

## 2014-08-08 NOTE — Telephone Encounter (Signed)
LMOVM - meds called in to [facility].  Pt to call clinic with questions.

## 2014-08-08 NOTE — Anesthesia Postprocedure Evaluation (Signed)
  Anesthesia Post-op Note  Patient: Barbara Myers  Procedure(s) Performed: Procedure(s): INSERTION PORT-A-CATH left subclavian (N/A)  Patient Location: PACU  Anesthesia Type: General   Level of Consciousness: awake, alert  and oriented  Airway and Oxygen Therapy: Patient Spontanous Breathing  Post-op Pain: mild  Post-op Assessment: Post-op Vital signs reviewed  Post-op Vital Signs: Reviewed  Last Vitals:  Filed Vitals:   08/08/14 1459  BP: 162/92  Pulse: 60  Temp: 36.7 C  Resp:     Complications: No apparent anesthesia complications

## 2014-08-08 NOTE — Interval H&P Note (Signed)
History and Physical Interval Note:  08/08/2014 12:12 PM  Barbara Myers  has presented today for surgery, with the diagnosis of BREAST CANCER  The various methods of treatment have been discussed with the patient and family. After consideration of risks, benefits and other options for treatment, the patient has consented to  Procedure(s): INSERTION PORT-A-CATH (N/A) as a surgical intervention .  The patient's history has been reviewed, patient examined, no change in status, stable for surgery.  I have reviewed the patient's chart and labs.  Questions were answered to the patient's satisfaction.     Giorgia Wahler

## 2014-08-09 ENCOUNTER — Telehealth: Payer: Self-pay

## 2014-08-09 ENCOUNTER — Encounter: Payer: Self-pay | Admitting: *Deleted

## 2014-08-09 ENCOUNTER — Ambulatory Visit (HOSPITAL_BASED_OUTPATIENT_CLINIC_OR_DEPARTMENT_OTHER): Payer: Medicaid Other | Admitting: Hematology and Oncology

## 2014-08-09 ENCOUNTER — Telehealth: Payer: Self-pay | Admitting: *Deleted

## 2014-08-09 ENCOUNTER — Ambulatory Visit (HOSPITAL_BASED_OUTPATIENT_CLINIC_OR_DEPARTMENT_OTHER): Payer: Medicaid Other

## 2014-08-09 ENCOUNTER — Telehealth: Payer: Self-pay | Admitting: Hematology and Oncology

## 2014-08-09 ENCOUNTER — Ambulatory Visit: Payer: Medicaid Other

## 2014-08-09 ENCOUNTER — Encounter (HOSPITAL_BASED_OUTPATIENT_CLINIC_OR_DEPARTMENT_OTHER): Payer: Self-pay | Admitting: General Surgery

## 2014-08-09 ENCOUNTER — Other Ambulatory Visit (HOSPITAL_BASED_OUTPATIENT_CLINIC_OR_DEPARTMENT_OTHER): Payer: Medicaid Other

## 2014-08-09 VITALS — BP 121/70 | HR 62 | Temp 98.0°F | Resp 18 | Ht 65.0 in | Wt 192.2 lb

## 2014-08-09 DIAGNOSIS — C50411 Malignant neoplasm of upper-outer quadrant of right female breast: Secondary | ICD-10-CM

## 2014-08-09 DIAGNOSIS — Z5111 Encounter for antineoplastic chemotherapy: Secondary | ICD-10-CM

## 2014-08-09 DIAGNOSIS — Z95828 Presence of other vascular implants and grafts: Secondary | ICD-10-CM

## 2014-08-09 DIAGNOSIS — D500 Iron deficiency anemia secondary to blood loss (chronic): Secondary | ICD-10-CM

## 2014-08-09 DIAGNOSIS — C773 Secondary and unspecified malignant neoplasm of axilla and upper limb lymph nodes: Secondary | ICD-10-CM

## 2014-08-09 DIAGNOSIS — Z170 Estrogen receptor positive status [ER+]: Secondary | ICD-10-CM

## 2014-08-09 LAB — COMPREHENSIVE METABOLIC PANEL (CC13)
ALT: 13 U/L (ref 0–55)
AST: 14 U/L (ref 5–34)
Albumin: 3.7 g/dL (ref 3.5–5.0)
Alkaline Phosphatase: 44 U/L (ref 40–150)
Anion Gap: 6 mEq/L (ref 3–11)
BUN: 9.6 mg/dL (ref 7.0–26.0)
CO2: 23 meq/L (ref 22–29)
Calcium: 8.7 mg/dL (ref 8.4–10.4)
Chloride: 111 mEq/L — ABNORMAL HIGH (ref 98–109)
Creatinine: 0.8 mg/dL (ref 0.6–1.1)
EGFR: >90 mL/min/{1.73_m2} (ref >90)
GLUCOSE: 135 mg/dL (ref 70–140)
Potassium: 3.3 mEq/L — ABNORMAL LOW (ref 3.5–5.1)
SODIUM: 140 meq/L (ref 136–145)
TOTAL PROTEIN: 6.3 g/dL — AB (ref 6.4–8.3)
Total Bilirubin: 0.3 mg/dL (ref 0.20–1.20)

## 2014-08-09 LAB — CBC WITH DIFFERENTIAL/PLATELET
BASO%: 0.1 % (ref 0.0–2.0)
Basophils Absolute: 0 10*3/uL (ref 0.0–0.1)
EOS%: 0 % (ref 0.0–7.0)
Eosinophils Absolute: 0 10*3/uL (ref 0.0–0.5)
HCT: 27.1 % — ABNORMAL LOW (ref 34.8–46.6)
HGB: 8.1 g/dL — ABNORMAL LOW (ref 11.6–15.9)
LYMPH#: 1.5 10*3/uL (ref 0.9–3.3)
LYMPH%: 13.9 % — AB (ref 14.0–49.7)
MCH: 24.8 pg — AB (ref 25.1–34.0)
MCHC: 29.9 g/dL — ABNORMAL LOW (ref 31.5–36.0)
MCV: 82.9 fL (ref 79.5–101.0)
MONO#: 0.4 10*3/uL (ref 0.1–0.9)
MONO%: 3.9 % (ref 0.0–14.0)
NEUT%: 82.1 % — ABNORMAL HIGH (ref 38.4–76.8)
NEUTROS ABS: 9 10*3/uL — AB (ref 1.5–6.5)
NRBC: 0 % (ref 0–0)
Platelets: 151 10*3/uL (ref 145–400)
RBC: 3.27 10*6/uL — AB (ref 3.70–5.45)
RDW: 29.9 % — AB (ref 11.2–14.5)
WBC: 10.9 10*3/uL — ABNORMAL HIGH (ref 3.9–10.3)

## 2014-08-09 LAB — TECHNOLOGIST REVIEW

## 2014-08-09 MED ORDER — DEXAMETHASONE SODIUM PHOSPHATE 20 MG/5ML IJ SOLN
12.0000 mg | Freq: Once | INTRAMUSCULAR | Status: AC
  Administered 2014-08-09: 12 mg via INTRAVENOUS

## 2014-08-09 MED ORDER — HEPARIN SOD (PORK) LOCK FLUSH 100 UNIT/ML IV SOLN
500.0000 [IU] | Freq: Once | INTRAVENOUS | Status: AC | PRN
  Administered 2014-08-09: 500 [IU]
  Filled 2014-08-09: qty 5

## 2014-08-09 MED ORDER — PALONOSETRON HCL INJECTION 0.25 MG/5ML
INTRAVENOUS | Status: AC
  Filled 2014-08-09: qty 5

## 2014-08-09 MED ORDER — SODIUM CHLORIDE 0.9 % IJ SOLN
10.0000 mL | INTRAMUSCULAR | Status: DC | PRN
  Administered 2014-08-09: 10 mL via INTRAVENOUS
  Filled 2014-08-09: qty 10

## 2014-08-09 MED ORDER — SODIUM CHLORIDE 0.9 % IV SOLN
600.0000 mg/m2 | Freq: Once | INTRAVENOUS | Status: AC
  Administered 2014-08-09: 1200 mg via INTRAVENOUS
  Filled 2014-08-09: qty 60

## 2014-08-09 MED ORDER — FOSAPREPITANT DIMEGLUMINE INJECTION 150 MG
150.0000 mg | Freq: Once | INTRAVENOUS | Status: AC
  Administered 2014-08-09: 150 mg via INTRAVENOUS
  Filled 2014-08-09: qty 5

## 2014-08-09 MED ORDER — SODIUM CHLORIDE 0.9 % IV SOLN
Freq: Once | INTRAVENOUS | Status: AC
  Administered 2014-08-09: 10:00:00 via INTRAVENOUS

## 2014-08-09 MED ORDER — DOXORUBICIN HCL CHEMO IV INJECTION 2 MG/ML
60.0000 mg/m2 | Freq: Once | INTRAVENOUS | Status: AC
  Administered 2014-08-09: 120 mg via INTRAVENOUS
  Filled 2014-08-09: qty 60

## 2014-08-09 MED ORDER — SODIUM CHLORIDE 0.9 % IJ SOLN
10.0000 mL | INTRAMUSCULAR | Status: DC | PRN
  Administered 2014-08-09: 10 mL
  Filled 2014-08-09: qty 10

## 2014-08-09 MED ORDER — DEXAMETHASONE SODIUM PHOSPHATE 20 MG/5ML IJ SOLN
INTRAMUSCULAR | Status: AC
  Filled 2014-08-09: qty 5

## 2014-08-09 MED ORDER — PALONOSETRON HCL INJECTION 0.25 MG/5ML
0.2500 mg | Freq: Once | INTRAVENOUS | Status: AC
  Administered 2014-08-09: 0.25 mg via INTRAVENOUS

## 2014-08-09 NOTE — Progress Notes (Signed)
Patient Care Team: No Pcp Per Patient as PCP - General (General Practice) Rolm Bookbinder, MD as Consulting Physician (General Surgery) Rulon Eisenmenger, MD as Consulting Physician (Hematology and Oncology) Thea Silversmith, MD as Consulting Physician (Radiation Oncology) Trinda Pascal, NP as Nurse Practitioner (Nurse Practitioner)  DIAGNOSIS: Breast cancer of upper-outer quadrant of right female breast   Staging form: Breast, AJCC 7th Edition     Clinical stage from 07/26/2014: Stage IIB (T2, N1, M0) - Unsigned       Staging comments: Staged at breast conference on 12.16.15    SUMMARY OF ONCOLOGIC HISTORY:   Breast cancer of upper-outer quadrant of right female breast   07/13/2014 Mammogram indeterminate mass in the right breast by ultrasound measured 2 cm at 10:00 position   07/20/2014 Initial Diagnosis right breast biopsy: Invasive ductal carcinoma, 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 She underwent a port placement chemotherapy education class as well as echocardiogram. She is here today to sign consent and start chemotherapy.  REVIEW OF SYSTEMS:   Constitutional: Denies fevers, chills or abnormal weight loss Eyes: Denies blurriness of vision Ears, nose, mouth, throat, and face: Denies mucositis or sore throat Respiratory: Denies cough, dyspnea or wheezes Cardiovascular: Denies palpitation, chest discomfort or lower extremity swelling Gastrointestinal:  Denies nausea, heartburn or change in bowel habits Skin:  Denies abnormal skin rashes Lymphatics: Denies new lymphadenopathy or easy bruising Neurological:Denies numbness, tingling or new weaknesses Behavioral/Psych: Mood is stable, no new changes  All other systems were reviewed with the patient and are negative.  I have reviewed the past medical history, past surgical history, social history and family history with the patient and they are unchanged from previous note.  ALLERGIES:  has No Known Allergies.  MEDICATIONS:  Current Outpatient Prescriptions  Medication Sig Dispense Refill  . Atorvastatin Calcium (INVESTIGATIONAL ATORVASTATIN/PLACEBO) 40 MG tablet [facility] [address] Take 1 tablet by mouth daily. Take 2 doses (these doses must be 12 hours apart) prior to first chemotherapy treatment. Then take 1 tablet daily by mouth with or without food. 180 tablet 0  . dexamethasone (DECADRON) 4 MG tablet Take 2 tablets by mouth once a day on the day after chemotherapy and then take 2 tablets two times a day for 2 days. Take with food. 30 tablet 1  . lidocaine-prilocaine (EMLA) cream Apply to affected area once 30 g 3  . LORazepam (ATIVAN) 0.5 MG tablet Take 1 tablet (0.5 mg total) by mouth every 6 (six) hours as needed (Nausea or vomiting). 30 tablet 0  . medroxyPROGESTERone (DEPO-PROVERA) 150 MG/ML injection   0  . ondansetron (ZOFRAN) 8 MG tablet Take 1 tablet (8 mg total) by mouth 2 (two) times daily as needed. Start on the third day after chemotherapy. 30 tablet 1  . oxyCODONE-acetaminophen (PERCOCET) 10-325 MG per tablet Take 1 tablet by mouth every 6 (six) hours as needed for pain. 20 tablet 0  . prochlorperazine (COMPAZINE) 10 MG tablet Take 1 tablet (10 mg total) by mouth every 6 (six) hours as needed (Nausea or vomiting). 30 tablet 1  . ranitidine (  ZANTAC) 300 MG tablet Take 1 tablet (300 mg total) by mouth at bedtime. 30 tablet 0   No current facility-administered medications for this visit.   Facility-Administered Medications Ordered in  Other Visits  Medication Dose Route Frequency Provider Last Rate Last Dose  . sodium chloride 0.9 % injection 10 mL  10 mL Intravenous PRN Rulon Eisenmenger, MD   10 mL at 08/09/14 0912    PHYSICAL EXAMINATION: ECOG PERFORMANCE STATUS: 0 - Asymptomatic  Filed Vitals:   08/09/14 0933  BP: 121/70  Pulse: 62  Temp: 98 F (36.7 C)  Resp: 18   Filed Weights   08/09/14 0933  Weight: 192 lb 3.2 oz (87.181 kg)    GENERAL:alert, no distress and comfortable SKIN: skin color, texture, turgor are normal, no rashes or significant lesions EYES: normal, Conjunctiva are pink and non-injected, sclera clear OROPHARYNX:no exudate, no erythema and lips, buccal mucosa, and tongue normal  NECK: supple, thyroid normal size, non-tender, without nodularity LYMPH:  no palpable lymphadenopathy in the cervical, axillary or inguinal LUNGS: clear to auscultation and percussion with normal breathing effort HEART: regular rate & rhythm and no murmurs and no lower extremity edema ABDOMEN:abdomen soft, non-tender and normal bowel sounds Musculoskeletal:no cyanosis of digits and no clubbing  NEURO: alert & oriented x 3 with fluent speech, no focal motor/sensory deficits  LABORATORY DATA:  I have reviewed the data as listed   Chemistry      Component Value Date/Time   NA 140 08/09/2014 0906   NA 138 01/22/2011 2330   K 3.3* 08/09/2014 0906   K 3.6 01/22/2011 2330   CL 104 01/22/2011 2330   CO2 23 08/09/2014 0906   CO2 28 01/22/2011 2330   BUN 9.6 08/09/2014 0906   BUN 9 01/22/2011 2330   CREATININE 0.8 08/09/2014 0906   CREATININE 0.72 01/22/2011 2330      Component Value Date/Time   CALCIUM 8.7 08/09/2014 0906   CALCIUM 9.2 01/22/2011 2330   ALKPHOS 44 08/09/2014 0906   ALKPHOS 55 01/22/2011 2330   AST 14 08/09/2014 0906   AST 21 01/22/2011 2330   ALT 13 08/09/2014 0906   ALT 15 01/22/2011 2330   BILITOT 0.30 08/09/2014 0906   BILITOT 0.2* 01/22/2011 2330       Lab Results  Component  Value Date   WBC 5.4 07/26/2014   HGB 9.3* 08/08/2014   HCT 24.3* 07/26/2014   MCV 74.9* 07/26/2014   PLT 598* 07/26/2014   NEUTROABS 3.5 07/26/2014     RADIOGRAPHIC STUDIES: I have personally reviewed the radiology reports and agreed with their findings. Dg Chest Port 1 View  08/08/2014   CLINICAL DATA:  Port-A-Cath placement  EXAM: PORTABLE CHEST - 1 VIEW  COMPARISON:  None.  FINDINGS: Port-A-Cath tip is in the superior vena cava. No pneumothorax. Lungs are clear. Heart is borderline enlarged with pulmonary vascularity within normal limits. No adenopathy. No bone lesions.  IMPRESSION: Port-A-Cath tip in superior vena cava. No edema or consolidation. Heart borderline enlarged.   Electronically Signed   By: Lowella Grip M.D.   On: 08/08/2014 13:58   Dg Fluoro Guide Cv Line-no Report  08/08/2014   CLINICAL DATA:    FLOURO GUIDE CV LINE  Fluoroscopy was utilized by the requesting physician.  No radiographic  interpretation.      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 Return to clinic in 1 week for toxicity check.  Iron deficiency anemia due to chronic blood loss Secondary to heavy menstrual bleeding. Patient was given Zoladex injections for ovarian suppression. IV iron was given for a hemoglobin of 6.9. Today's hemoglobin is 9.2. She is currently on menstrual cycle. She has another infusion of IV iron set up. Patient should stop having  periods after chemotherapy get started.   Orders Placed This Encounter  Procedures  . CBC with Differential    Standing Status: Future     Number of Occurrences:      Standing Expiration Date: 08/09/2015  . Comprehensive metabolic panel (Cmet) - [facility]    Standing Status: Future     Number of Occurrences:      Standing Expiration Date: 08/09/2015   The patient has a good understanding of the overall plan. she agrees with it. She will call with any problems that may develop before her next visit here.   Rulon Eisenmenger, MD 08/09/2014 10:00 AM

## 2014-08-09 NOTE — Telephone Encounter (Signed)
, °

## 2014-08-09 NOTE — Assessment & Plan Note (Signed)
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 Return to clinic in 1 week for toxicity check.

## 2014-08-09 NOTE — Patient Instructions (Signed)
[facility] Discharge Instructions for Patients Receiving Chemotherapy  Today you received the following chemotherapy agents adriamycin and cytoxan  To help prevent nausea and vomiting after your treatment, we encourage you to take your nausea medication as directed by MD   If you develop nausea and vomiting that is not controlled by your nausea medication, call the clinic.   BELOW ARE SYMPTOMS THAT SHOULD BE REPORTED IMMEDIATELY:  *FEVER GREATER THAN 100.5 F  *CHILLS WITH OR WITHOUT FEVER  NAUSEA AND VOMITING THAT IS NOT CONTROLLED WITH YOUR NAUSEA MEDICATION  *UNUSUAL SHORTNESS OF BREATH  *UNUSUAL BRUISING OR BLEEDING  TENDERNESS IN MOUTH AND THROAT WITH OR WITHOUT PRESENCE OF ULCERS  *URINARY PROBLEMS  *BOWEL PROBLEMS  UNUSUAL RASH Items with * indicate a potential emergency and should be followed up as soon as possible.  Feel free to call the clinic you have any questions or concerns. The clinic phone number is [phone].

## 2014-08-09 NOTE — Telephone Encounter (Signed)
Per staff message and POF I have scheduled appts. Advised scheduler of appts. JMW  

## 2014-08-09 NOTE — Telephone Encounter (Signed)
Returned pt call re: Neulasta appt.  Made appt for injection with Flush at 230 pm.  Pt voiced understanding.

## 2014-08-09 NOTE — Assessment & Plan Note (Signed)
Secondary to heavy menstrual bleeding. Patient was given Zoladex injections for ovarian suppression. IV iron was given for a hemoglobin of 6.9. Patient should stop having periods after chemotherapy get started.

## 2014-08-09 NOTE — Progress Notes (Signed)
08/09/14 at 10:44am - Research follow up- The pt was into the cancer center this am for labs, MD visit, and for her cycle 1, day 1 treatment.  The research nurse met with the pt in the infusion room.  The pt said that she was ready to begin her chemotherapy today.  The pt confirmed that she began her study drug on 08/07/14.  She stated that she took her 2nd dose on 08/08/14.  The pt was instructed to continue taking her study drug daily preferably at the same time with or without food.  The pt confirmed that she is recording her daily doses on her medication calendar provided to her by Marcellus Scott, RN.  The pt had no questions or concerns about her study participation.  The pt was thanked for her continued support of this trial.

## 2014-08-09 NOTE — Patient Instructions (Signed)

## 2014-08-10 ENCOUNTER — Telehealth: Payer: Self-pay | Admitting: *Deleted

## 2014-08-10 ENCOUNTER — Ambulatory Visit (HOSPITAL_BASED_OUTPATIENT_CLINIC_OR_DEPARTMENT_OTHER): Payer: Medicaid Other

## 2014-08-10 DIAGNOSIS — Z5189 Encounter for other specified aftercare: Secondary | ICD-10-CM

## 2014-08-10 DIAGNOSIS — C50411 Malignant neoplasm of upper-outer quadrant of right female breast: Secondary | ICD-10-CM

## 2014-08-10 MED ORDER — PEGFILGRASTIM INJECTION 6 MG/0.6ML ~~LOC~~
6.0000 mg | PREFILLED_SYRINGE | Freq: Once | SUBCUTANEOUS | Status: AC
  Administered 2014-08-10: 6 mg via SUBCUTANEOUS
  Filled 2014-08-10: qty 0.6

## 2014-08-10 NOTE — Telephone Encounter (Signed)
Patient in for Neulasta injection today. Patient had her first chemotherapy treatment on 08/09/2014. She denies any pain or discomfort today. States, "I feel good. I'm not having any issues or complications." Patient also denies any nausea or vomiting, or tingling or numbness in fingers or toes. Neulasta information given to Patient with After Visit Summary. Encouraged Patient to call the [facility] if she has any questions or concerns or if condition changes. Patient verbalized understanding.

## 2014-08-10 NOTE — Patient Instructions (Signed)
Pegfilgrastim injection What is this medicine? PEGFILGRASTIM (peg fil GRA stim) is a long-acting granulocyte colony-stimulating factor that stimulates the growth of neutrophils, a type of white blood cell important in the body's fight against infection. It is used to reduce the incidence of fever and infection in patients with certain types of cancer who are receiving chemotherapy that affects the bone marrow. This medicine may be used for other purposes; ask your health care provider or pharmacist if you have questions. COMMON BRAND NAME(S): Neulasta What should I tell my health care provider before I take this medicine? They need to know if you have any of these conditions: -latex allergy -ongoing radiation therapy -sickle cell disease -skin reactions to acrylic adhesives (On-Body Injector only) -an unusual or allergic reaction to pegfilgrastim, filgrastim, other medicines, foods, dyes, or preservatives -pregnant or trying to get pregnant -breast-feeding How should I use this medicine? This medicine is for injection under the skin. If you get this medicine at home, you will be taught how to prepare and give the pre-filled syringe or how to use the On-body Injector. Refer to the patient Instructions for Use for detailed instructions. Use exactly as directed. Take your medicine at regular intervals. Do not take your medicine more often than directed. It is important that you put your used needles and syringes in a special sharps container. Do not put them in a trash can. If you do not have a sharps container, call your pharmacist or healthcare provider to get one. Talk to your pediatrician regarding the use of this medicine in children. Special care may be needed. Overdosage: If you think you have taken too much of this medicine contact a poison control center or emergency room at once. NOTE: This medicine is only for you. Do not share this medicine with others. What if I miss a dose? It is  important not to miss your dose. Call your doctor or health care professional if you miss your dose. If you miss a dose due to an On-body Injector failure or leakage, a new dose should be administered as soon as possible using a single prefilled syringe for manual use. What may interact with this medicine? Interactions have not been studied. Give your health care provider a list of all the medicines, herbs, non-prescription drugs, or dietary supplements you use. Also tell them if you smoke, drink alcohol, or use illegal drugs. Some items may interact with your medicine. This list may not describe all possible interactions. Give your health care provider a list of all the medicines, herbs, non-prescription drugs, or dietary supplements you use. Also tell them if you smoke, drink alcohol, or use illegal drugs. Some items may interact with your medicine. What should I watch for while using this medicine? You may need blood work done while you are taking this medicine. If you are going to need a MRI, CT scan, or other procedure, tell your doctor that you are using this medicine (On-Body Injector only). What side effects may I notice from receiving this medicine? Side effects that you should report to your doctor or health care professional as soon as possible: -allergic reactions like skin rash, itching or hives, swelling of the face, lips, or tongue -dizziness -fever -pain, redness, or irritation at site where injected -pinpoint red spots on the skin -shortness of breath or breathing problems -stomach or side pain, or pain at the shoulder -swelling -tiredness -trouble passing urine Side effects that usually do not require medical attention (report to your doctor   or health care professional if they continue or are bothersome): -bone pain -muscle pain This list may not describe all possible side effects. Call your doctor for medical advice about side effects. You may report side effects to FDA at  1-800-FDA-1088. Where should I keep my medicine? Keep out of the reach of children. Store pre-filled syringes in a refrigerator between 2 and 8 degrees C (36 and 46 degrees F). Do not freeze. Keep in carton to protect from light. Throw away this medicine if it is left out of the refrigerator for more than 48 hours. Throw away any unused medicine after the expiration date. NOTE: This sheet is a summary. It may not cover all possible information. If you have questions about this medicine, talk to your doctor, pharmacist, or health care provider.  2015, [employer]vier/Gold Standard. (2013-10-27 16:14:05)  

## 2014-08-17 ENCOUNTER — Ambulatory Visit (HOSPITAL_BASED_OUTPATIENT_CLINIC_OR_DEPARTMENT_OTHER): Payer: Medicaid Other | Admitting: Hematology and Oncology

## 2014-08-17 ENCOUNTER — Other Ambulatory Visit (HOSPITAL_BASED_OUTPATIENT_CLINIC_OR_DEPARTMENT_OTHER): Payer: Medicaid Other

## 2014-08-17 DIAGNOSIS — K219 Gastro-esophageal reflux disease without esophagitis: Secondary | ICD-10-CM

## 2014-08-17 DIAGNOSIS — D509 Iron deficiency anemia, unspecified: Secondary | ICD-10-CM

## 2014-08-17 DIAGNOSIS — C50411 Malignant neoplasm of upper-outer quadrant of right female breast: Secondary | ICD-10-CM

## 2014-08-17 LAB — COMPREHENSIVE METABOLIC PANEL (CC13)
ALT: 9 U/L (ref 0–55)
AST: 12 U/L (ref 5–34)
Albumin: 3.7 g/dL (ref 3.5–5.0)
Alkaline Phosphatase: 77 U/L (ref 40–150)
Anion Gap: 7 mEq/L (ref 3–11)
BILIRUBIN TOTAL: 0.24 mg/dL (ref 0.20–1.20)
BUN: 7.5 mg/dL (ref 7.0–26.0)
CHLORIDE: 108 meq/L (ref 98–109)
CO2: 26 mEq/L (ref 22–29)
CREATININE: 0.7 mg/dL (ref 0.6–1.1)
Calcium: 8.9 mg/dL (ref 8.4–10.4)
EGFR: >90 mL/min/{1.73_m2} (ref >90)
GLUCOSE: 89 mg/dL (ref 70–140)
POTASSIUM: 4 meq/L (ref 3.5–5.1)
Sodium: 142 mEq/L (ref 136–145)
TOTAL PROTEIN: 6.2 g/dL — AB (ref 6.4–8.3)

## 2014-08-17 LAB — CBC WITH DIFFERENTIAL/PLATELET
BASO%: 1.5 % (ref 0.0–2.0)
Basophils Absolute: 0.1 10*3/uL (ref 0.0–0.1)
EOS ABS: 0.2 10*3/uL (ref 0.0–0.5)
EOS%: 4.8 % (ref 0.0–7.0)
HCT: 27.1 % — ABNORMAL LOW (ref 34.8–46.6)
HGB: 8.2 g/dL — ABNORMAL LOW (ref 11.6–15.9)
LYMPH%: 34 % (ref 14.0–49.7)
MCH: 25.9 pg (ref 25.1–34.0)
MCHC: 30.3 g/dL — ABNORMAL LOW (ref 31.5–36.0)
MCV: 85.5 fL (ref 79.5–101.0)
MONO#: 0.3 10*3/uL (ref 0.1–0.9)
MONO%: 8.1 % (ref 0.0–14.0)
NEUT#: 2 10*3/uL (ref 1.5–6.5)
NEUT%: 51.6 % (ref 38.4–76.8)
PLATELETS: 208 10*3/uL (ref 145–400)
RBC: 3.17 10*6/uL — ABNORMAL LOW (ref 3.70–5.45)
RDW: 27.6 % — ABNORMAL HIGH (ref 11.2–14.5)
WBC: 3.9 10*3/uL (ref 3.9–10.3)
lymph#: 1.3 10*3/uL (ref 0.9–3.3)

## 2014-08-17 NOTE — Progress Notes (Signed)
Patient Care Team: No Pcp Per Patient as PCP - General (General Practice) Rolm Bookbinder, MD as Consulting Physician (General Surgery) Rulon Eisenmenger, MD as Consulting Physician (Hematology and Oncology) Thea Silversmith, MD as Consulting Physician (Radiation Oncology) Trinda Pascal, NP as Nurse Practitioner (Nurse Practitioner)  DIAGNOSIS: Breast cancer of upper-outer quadrant of right female breast   Staging form: Breast, AJCC 7th Edition     Clinical stage from 07/26/2014: Stage IIB (T2, N1, M0) - Unsigned       Staging comments: Staged at breast conference on 12.16.15    SUMMARY OF ONCOLOGIC HISTORY:   Breast cancer of upper-outer quadrant of right female breast   07/13/2014 Mammogram indeterminate mass in the right breast by ultrasound measured 2 cm at 10:00 position   07/20/2014 Initial Diagnosis right breast biopsy: Invasive ductal carcinoma, 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 She received first cycle of chemotherapy week ago and is here today for toxicity check. She reports that she tolerated chemotherapy extremely well without any major problems. She did not have any nausea vomiting. She did feel a lump in the throat like sensation and slight chest pressure. I suspect this may be related to acid reflux related to steroids.  REVIEW OF SYSTEMS:   Constitutional: Denies fevers, chills or abnormal weight loss Eyes: Denies blurriness of vision Ears, nose, mouth, throat, and face: Denies  mucositis or sore throat Respiratory: Denies cough, dyspnea or wheezes; slight chest pressure and lump in the throat sensation Cardiovascular: Denies palpitation, chest discomfort or lower extremity swelling Gastrointestinal:  Denies nausea, heartburn or change in bowel habits Skin: Denies abnormal skin rashes Lymphatics: Denies new lymphadenopathy or easy bruising Neurological:Denies numbness, tingling or new weaknesses Behavioral/Psych: Mood is stable, no new changes  All other systems were reviewed with the patient and are negative.  I have reviewed the past medical history, past surgical history, social history and family history with the patient and they are unchanged from previous note.  ALLERGIES:  has No Known Allergies.  MEDICATIONS:  Current Outpatient Prescriptions  Medication Sig Dispense Refill  . Atorvastatin Calcium (INVESTIGATIONAL ATORVASTATIN/PLACEBO) 40 MG tablet [facility] [id number] Take 1 tablet by mouth daily. Take 2 doses (these doses must be 12 hours apart) prior to first chemotherapy treatment. Then take 1 tablet daily by mouth with or without food. 180 tablet 0  . dexamethasone (DECADRON) 4 MG tablet Take 2 tablets by mouth once a day on the day after chemotherapy and then take 2 tablets two times a day for 2 days. Take with food. 30 tablet 1  . lidocaine-prilocaine (EMLA) cream Apply to affected area once 30 g 3  . LORazepam (ATIVAN) 0.5 MG tablet Take 1 tablet (0.5 mg total) by mouth every 6 (six) hours as needed (Nausea or vomiting). 30 tablet 0  . medroxyPROGESTERone (DEPO-PROVERA) 150 MG/ML injection   0  . ondansetron (ZOFRAN) 8 MG tablet Take 1 tablet (8 mg total) by mouth 2 (two) times daily as needed. Start on the third day after chemotherapy. 30 tablet 1  . prochlorperazine (  COMPAZINE) 10 MG tablet Take 1 tablet (10 mg total) by mouth every 6 (six) hours as needed (Nausea or vomiting). 30 tablet 1  . ranitidine (ZANTAC) 300 MG tablet Take 1 tablet (300 mg  total) by mouth at bedtime. 30 tablet 0   No current facility-administered medications for this visit.    PHYSICAL EXAMINATION: ECOG PERFORMANCE STATUS: 1 - Symptomatic but completely ambulatory  Filed Vitals:   08/17/14 1131  BP: 138/85  Pulse: 71  Temp: 98.7 F (37.1 C)  Resp: 18   Filed Weights   08/17/14 1131  Weight: 195 lb 3.2 oz (88.542 kg)    GENERAL:alert, no distress and comfortable SKIN: skin color, texture, turgor are normal, no rashes or significant lesions EYES: normal, Conjunctiva are pink and non-injected, sclera clear OROPHARYNX:no exudate, no erythema and lips, buccal mucosa, and tongue normal  NECK: supple, thyroid normal size, non-tender, without nodularity LYMPH:  no palpable lymphadenopathy in the cervical, axillary or inguinal LUNGS: clear to auscultation and percussion with normal breathing effort HEART: regular rate & rhythm and no murmurs and no lower extremity edema ABDOMEN:abdomen soft, non-tender and normal bowel sounds Musculoskeletal:no cyanosis of digits and no clubbing  NEURO: alert & oriented x 3 with fluent speech, no focal motor/sensory deficits   LABORATORY DATA:  I have reviewed the data as listed   Chemistry      Component Value Date/Time   NA 142 08/17/2014 1051   NA 138 01/22/2011 2330   K 4.0 08/17/2014 1051   K 3.6 01/22/2011 2330   CL 104 01/22/2011 2330   CO2 26 08/17/2014 1051   CO2 28 01/22/2011 2330   BUN 7.5 08/17/2014 1051   BUN 9 01/22/2011 2330   CREATININE 0.7 08/17/2014 1051   CREATININE 0.72 01/22/2011 2330      Component Value Date/Time   CALCIUM 8.9 08/17/2014 1051   CALCIUM 9.2 01/22/2011 2330   ALKPHOS 77 08/17/2014 1051   ALKPHOS 55 01/22/2011 2330   AST 12 08/17/2014 1051   AST 21 01/22/2011 2330   ALT 9 08/17/2014 1051   ALT 15 01/22/2011 2330   BILITOT 0.24 08/17/2014 1051   BILITOT 0.2* 01/22/2011 2330       Lab Results  Component Value Date   WBC 3.9 08/17/2014   HGB 8.2* 08/17/2014    HCT 27.1* 08/17/2014   MCV 85.5 08/17/2014   PLT 208 08/17/2014   NEUTROABS 2.0 08/17/2014   ASSESSMENT & PLAN:  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 Acid reflux: Patient has Zantac and she will start taking it. I instructed her to decrease her Decadron to 1 pill twice a day for the next cycle. 2. Chest pressure: Probably related to #1 Her blood counts were reviewed and she has adequate white count and hence we will remain on the same dosage level for her chemotherapy.  Iron deficiency anemia: We will recheck her hemoglobin along with iron studies with the next chemotherapy treatment. I instructed her to start oral iron therapy once a day. It might have increase oral iron supplementation. She is currently on Zoladex injections to stop  her menstrual cycles which have been very heavy previously.  Return to clinic in 1 week for cycle 2   Orders Placed This Encounter  Procedures  . CBC with Differential    Standing Status: Future     Number of Occurrences:      Standing Expiration Date: 08/17/2015  . Comprehensive metabolic panel (Cmet) - [facility]    Standing Status: Future     Number of Occurrences:      Standing Expiration Date: 08/17/2015  . Iron and TIBC [facility]    Standing Status: Future     Number of Occurrences:      Standing Expiration Date: 08/17/2015  . Ferritin    Standing Status: Future     Number of Occurrences:      Standing Expiration Date: 08/17/2015  . Hemoglobinopathy evaluation    Standing Status: Future     Number of Occurrences:      Standing Expiration Date:  08/17/2015   The patient has a good understanding of the overall plan. she agrees with it. She will call with any problems that may develop before her next visit here.   Rulon Eisenmenger, MD 08/17/2014 12:49 PM

## 2014-08-17 NOTE — Assessment & Plan Note (Signed)
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 Acid reflux 2. Chest pressure Her blood counts were reviewed and she has adequate white count and hence we will remain on the same dosage level for her chemotherapy.  Iron deficiency anemia: We will recheck her hemoglobin along with iron studies with the next chemotherapy treatment. I instructed her to start oral iron therapy once a day. It might have increase oral iron supplementation. She is currently on Zoladex injections to stop her menstrual cycles which have been very heavy previously.  Return to clinic in 1 week for cycle 2

## 2014-08-21 ENCOUNTER — Encounter: Payer: Self-pay | Admitting: Hematology and Oncology

## 2014-08-21 NOTE — Progress Notes (Signed)
Cisplatin and cytoxan are not replaceable drugs; called patient and lmovm about neulasta assistance asked that she returns my call if interested.

## 2014-08-22 ENCOUNTER — Encounter: Payer: Self-pay | Admitting: Hematology and Oncology

## 2014-08-22 NOTE — Progress Notes (Signed)
Returned pt's call.  Her phone is not accepting incoming calls.

## 2014-08-23 ENCOUNTER — Ambulatory Visit (HOSPITAL_BASED_OUTPATIENT_CLINIC_OR_DEPARTMENT_OTHER): Payer: Medicaid Other

## 2014-08-23 ENCOUNTER — Ambulatory Visit (HOSPITAL_BASED_OUTPATIENT_CLINIC_OR_DEPARTMENT_OTHER): Payer: Medicaid Other | Admitting: Hematology and Oncology

## 2014-08-23 ENCOUNTER — Telehealth: Payer: Self-pay | Admitting: Hematology and Oncology

## 2014-08-23 ENCOUNTER — Other Ambulatory Visit (HOSPITAL_BASED_OUTPATIENT_CLINIC_OR_DEPARTMENT_OTHER): Payer: Medicaid Other

## 2014-08-23 ENCOUNTER — Other Ambulatory Visit: Payer: No Typology Code available for payment source

## 2014-08-23 VITALS — BP 154/93 | HR 68 | Temp 98.3°F | Resp 18

## 2014-08-23 DIAGNOSIS — C50411 Malignant neoplasm of upper-outer quadrant of right female breast: Secondary | ICD-10-CM

## 2014-08-23 DIAGNOSIS — C773 Secondary and unspecified malignant neoplasm of axilla and upper limb lymph nodes: Secondary | ICD-10-CM

## 2014-08-23 DIAGNOSIS — Z5111 Encounter for antineoplastic chemotherapy: Secondary | ICD-10-CM

## 2014-08-23 DIAGNOSIS — D509 Iron deficiency anemia, unspecified: Secondary | ICD-10-CM

## 2014-08-23 DIAGNOSIS — Z170 Estrogen receptor positive status [ER+]: Secondary | ICD-10-CM

## 2014-08-23 LAB — COMPREHENSIVE METABOLIC PANEL (CC13)
ALBUMIN: 3.8 g/dL (ref 3.5–5.0)
ALT: 15 U/L (ref 0–55)
AST: 17 U/L (ref 5–34)
Alkaline Phosphatase: 84 U/L (ref 40–150)
Anion Gap: 8 mEq/L (ref 3–11)
BUN: 9 mg/dL (ref 7.0–26.0)
CALCIUM: 8.6 mg/dL (ref 8.4–10.4)
CHLORIDE: 107 meq/L (ref 98–109)
CO2: 27 mEq/L (ref 22–29)
Creatinine: 0.8 mg/dL (ref 0.6–1.1)
EGFR: >90 mL/min/{1.73_m2} (ref >90)
Glucose: 92 mg/dl (ref 70–140)
POTASSIUM: 3.7 meq/L (ref 3.5–5.1)
Sodium: 143 mEq/L (ref 136–145)
Total Bilirubin: <0.2 mg/dL (ref 0.20–1.20)
Total Protein: 6.4 g/dL (ref 6.4–8.3)

## 2014-08-23 LAB — CBC WITH DIFFERENTIAL/PLATELET
BASO%: 0.3 % (ref 0.0–2.0)
BASOS ABS: 0 10*3/uL (ref 0.0–0.1)
EOS%: 0.7 % (ref 0.0–7.0)
Eosinophils Absolute: 0.1 10*3/uL (ref 0.0–0.5)
HEMATOCRIT: 29.8 % — AB (ref 34.8–46.6)
HGB: 8.8 g/dL — ABNORMAL LOW (ref 11.6–15.9)
LYMPH#: 1.7 10*3/uL (ref 0.9–3.3)
LYMPH%: 18.4 % (ref 14.0–49.7)
MCH: 25.9 pg (ref 25.1–34.0)
MCHC: 29.5 g/dL — ABNORMAL LOW (ref 31.5–36.0)
MCV: 87.6 fL (ref 79.5–101.0)
MONO#: 0.5 10*3/uL (ref 0.1–0.9)
MONO%: 5.6 % (ref 0.0–14.0)
NEUT#: 6.9 10*3/uL — ABNORMAL HIGH (ref 1.5–6.5)
NEUT%: 75 % (ref 38.4–76.8)
Platelets: 339 10*3/uL (ref 145–400)
RBC: 3.4 10*6/uL — ABNORMAL LOW (ref 3.70–5.45)
RDW: 27.2 % — ABNORMAL HIGH (ref 11.2–14.5)
WBC: 9.1 10*3/uL (ref 3.9–10.3)

## 2014-08-23 LAB — FERRITIN CHCC: FERRITIN: 115 ng/mL (ref 9–269)

## 2014-08-23 LAB — IRON AND TIBC CHCC
%SAT: 27 % (ref 21–57)
Iron: 74 ug/dL (ref 41–142)
TIBC: 280 ug/dL (ref 236–444)
UIBC: 206 ug/dL (ref 120–384)

## 2014-08-23 MED ORDER — SODIUM CHLORIDE 0.9 % IV SOLN
Freq: Once | INTRAVENOUS | Status: AC
  Administered 2014-08-23: 10:00:00 via INTRAVENOUS

## 2014-08-23 MED ORDER — PALONOSETRON HCL INJECTION 0.25 MG/5ML
0.2500 mg | Freq: Once | INTRAVENOUS | Status: AC
  Administered 2014-08-23: 0.25 mg via INTRAVENOUS

## 2014-08-23 MED ORDER — PALONOSETRON HCL INJECTION 0.25 MG/5ML
INTRAVENOUS | Status: AC
  Filled 2014-08-23: qty 5

## 2014-08-23 MED ORDER — HEPARIN SOD (PORK) LOCK FLUSH 100 UNIT/ML IV SOLN
500.0000 [IU] | Freq: Once | INTRAVENOUS | Status: AC | PRN
  Administered 2014-08-23: 500 [IU]
  Filled 2014-08-23: qty 5

## 2014-08-23 MED ORDER — DOXORUBICIN HCL CHEMO IV INJECTION 2 MG/ML
60.0000 mg/m2 | Freq: Once | INTRAVENOUS | Status: AC
  Administered 2014-08-23: 120 mg via INTRAVENOUS
  Filled 2014-08-23: qty 60

## 2014-08-23 MED ORDER — SODIUM CHLORIDE 0.9 % IV SOLN
600.0000 mg/m2 | Freq: Once | INTRAVENOUS | Status: AC
  Administered 2014-08-23: 1200 mg via INTRAVENOUS
  Filled 2014-08-23: qty 60

## 2014-08-23 MED ORDER — DEXAMETHASONE SODIUM PHOSPHATE 20 MG/5ML IJ SOLN
INTRAMUSCULAR | Status: AC
  Filled 2014-08-23: qty 5

## 2014-08-23 MED ORDER — SODIUM CHLORIDE 0.9 % IV SOLN
150.0000 mg | Freq: Once | INTRAVENOUS | Status: AC
  Administered 2014-08-23: 150 mg via INTRAVENOUS
  Filled 2014-08-23: qty 5

## 2014-08-23 MED ORDER — SODIUM CHLORIDE 0.9 % IJ SOLN
10.0000 mL | INTRAMUSCULAR | Status: DC | PRN
  Administered 2014-08-23: 10 mL
  Filled 2014-08-23: qty 10

## 2014-08-23 MED ORDER — DEXAMETHASONE SODIUM PHOSPHATE 20 MG/5ML IJ SOLN
12.0000 mg | Freq: Once | INTRAMUSCULAR | Status: AC
  Administered 2014-08-23: 12 mg via INTRAVENOUS

## 2014-08-23 NOTE — Progress Notes (Signed)
Patient Care Team: No Pcp Per Patient as PCP - General (General Practice) Rolm Bookbinder, MD as Consulting Physician (General Surgery) Rulon Eisenmenger, MD as Consulting Physician (Hematology and Oncology) Thea Silversmith, MD as Consulting Physician (Radiation Oncology) Trinda Pascal, NP as Nurse Practitioner (Nurse Practitioner)  DIAGNOSIS: Breast cancer of upper-outer quadrant of right female breast   Staging form: Breast, AJCC 7th Edition     Clinical stage from 07/26/2014: Stage IIB (T2, N1, M0) - Unsigned       Staging comments: Staged at breast conference on 12.16.15    SUMMARY OF ONCOLOGIC HISTORY:   Breast cancer of upper-outer quadrant of right female breast   07/13/2014 Mammogram indeterminate mass in the right breast by ultrasound measured 2 cm at 10:00 position   07/20/2014 Initial Diagnosis right breast biopsy: Invasive ductal carcinoma, 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 Overall she tolerated cycle one extremely well. She had some chest discomfort which is unclear etiology. She denies any other new problems or concerns. She denies fevers or chills. Denies any nausea or vomiting.  REVIEW OF SYSTEMS:   Constitutional: Denies fevers, chills or abnormal weight loss Eyes: Denies blurriness of vision Ears, nose, mouth, throat, and face: Denies mucositis or sore throat Respiratory: Denies cough, dyspnea or wheezes Cardiovascular: Denies palpitation, chest discomfort or lower extremity  swelling Gastrointestinal:  Denies nausea, heartburn or change in bowel habits Skin: Denies abnormal skin rashes Lymphatics: Denies new lymphadenopathy or easy bruising Neurological:Denies numbness, tingling or new weaknesses Behavioral/Psych: Mood is stable, no new changes  All other systems were reviewed with the patient and are negative.  I have reviewed the past medical history, past surgical history, social history and family history with the patient and they are unchanged from previous note.  ALLERGIES:  has No Known Allergies.  MEDICATIONS:  Current Outpatient Prescriptions  Medication Sig Dispense Refill  . Atorvastatin Calcium (INVESTIGATIONAL ATORVASTATIN/PLACEBO) 40 MG tablet [facility] [id number] Take 1 tablet by mouth daily. Take 2 doses (these doses must be 12 hours apart) prior to first chemotherapy treatment. Then take 1 tablet daily by mouth with or without food. 180 tablet 0  . dexamethasone (DECADRON) 4 MG tablet Take 2 tablets by mouth once a day on the day after chemotherapy and then take 2 tablets two times a day for 2 days. Take with food. 30 tablet 1  . lidocaine-prilocaine (EMLA) cream Apply to affected area once 30 g 3  . LORazepam (ATIVAN) 0.5 MG tablet Take 1 tablet (0.5 mg total) by mouth every 6 (six) hours as needed (Nausea or vomiting). 30 tablet 0  . medroxyPROGESTERone (DEPO-PROVERA) 150 MG/ML injection   0  . ondansetron (ZOFRAN) 8 MG tablet Take 1 tablet (8 mg total) by mouth 2 (two) times daily as needed. Start on the third day after chemotherapy. 30 tablet 1  . prochlorperazine (COMPAZINE) 10 MG tablet Take 1 tablet (10 mg total) by mouth every 6 (six) hours as needed (Nausea or vomiting). 30 tablet 1  . ranitidine (ZANTAC) 300 MG tablet Take 1 tablet (300 mg total)  by mouth at bedtime. 30 tablet 0   No current facility-administered medications for this visit.   Facility-Administered Medications Ordered in Other Visits  Medication Dose Route Frequency  Provider Last Rate Last Dose  . cyclophosphamide (CYTOXAN) 1,200 mg in sodium chloride 0.9 % 250 mL chemo infusion  600 mg/m2 (Treatment Plan Actual) Intravenous Once Rulon Eisenmenger, MD      . DOXOrubicin (ADRIAMYCIN) chemo injection 120 mg  60 mg/m2 (Treatment Plan Actual) Intravenous Once Rulon Eisenmenger, MD      . fosaprepitant (EMEND) 150 mg in sodium chloride 0.9 % 145 mL IVPB  150 mg Intravenous Once Rulon Eisenmenger, MD 300 mL/hr at 08/23/14 1000 150 mg at 08/23/14 1000  . heparin lock flush 100 unit/mL  500 Units Intracatheter Once PRN Rulon Eisenmenger, MD      . palonosetron (ALOXI) injection 0.25 mg  0.25 mg Intravenous Once Rulon Eisenmenger, MD      . sodium chloride 0.9 % injection 10 mL  10 mL Intracatheter PRN Rulon Eisenmenger, MD        PHYSICAL EXAMINATION: ECOG PERFORMANCE STATUS: 0 - Asymptomatic  Filed Vitals:   08/23/14 0854  BP: 154/93  Pulse: 68  Temp: 98.3 F (36.8 C)  Resp: 18   There were no vitals filed for this visit.  GENERAL:alert, no distress and comfortable SKIN: skin color, texture, turgor are normal, no rashes or significant lesions EYES: normal, Conjunctiva are pink and non-injected, sclera clear OROPHARYNX:no exudate, no erythema and lips, buccal mucosa, and tongue normal  NECK: supple, thyroid normal size, non-tender, without nodularity LYMPH:  no palpable lymphadenopathy in the cervical, axillary or inguinal LUNGS: clear to auscultation and percussion with normal breathing effort HEART: regular rate & rhythm and no murmurs and no lower extremity edema ABDOMEN:abdomen soft, non-tender and normal bowel sounds Musculoskeletal:no cyanosis of digits and no clubbing  NEURO: alert & oriented x 3 with fluent speech, no focal motor/sensory deficits  LABORATORY DATA:  I have reviewed the data as listed   Chemistry      Component Value Date/Time   NA 143 08/23/2014 0822   NA 138 01/22/2011 2330   K 3.7 08/23/2014 0822   K 3.6 01/22/2011 2330   CL 104  01/22/2011 2330   CO2 27 08/23/2014 0822   CO2 28 01/22/2011 2330   BUN 9.0 08/23/2014 0822   BUN 9 01/22/2011 2330   CREATININE 0.8 08/23/2014 0822   CREATININE 0.72 01/22/2011 2330      Component Value Date/Time   CALCIUM 8.6 08/23/2014 0822   CALCIUM 9.2 01/22/2011 2330   ALKPHOS 84 08/23/2014 0822   ALKPHOS 55 01/22/2011 2330   AST 17 08/23/2014 0822   AST 21 01/22/2011 2330   ALT 15 08/23/2014 0822   ALT 15 01/22/2011 2330   BILITOT <0.20 08/23/2014 0822   BILITOT 0.2* 01/22/2011 2330       Lab Results  Component Value Date   WBC 9.1 08/23/2014   HGB 8.8* 08/23/2014   HCT 29.8* 08/23/2014   MCV 87.6 08/23/2014   PLT 339 08/23/2014   NEUTROABS 6.9* 08/23/2014   ASSESSMENT & PLAN:  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  Toxicities to chemotherapy 1. Acid reflux: Patient is taking Zantac and we decreased the dosage of Decadron. Patient is trying not to sleep after she eats food at dinner. 2. Chest pressure: Probably related to #1  Monitoring her closely for toxicities to chemotherapy Her blood counts were reviewed and she has adequate for her chemotherapy.  Iron deficiency anemia: Patient resumed oral iron therapy once a day.She is currently on Zoladex injections to stop her menstrual cycles which have been very heavy previously. We are awaiting today's results of iron studies. Hemoglobin today is 8.8.  Return to clinic in 2 weeks for cycle 3.     Orders Placed This Encounter  Procedures  . CBC with Differential    Standing Status: Future     Number of Occurrences:      Standing  Expiration Date: 08/23/2015  . Comprehensive metabolic panel (Cmet) - [facility]    Standing Status: Future     Number of Occurrences:      Standing Expiration Date: 08/23/2015   The patient has a good understanding of the overall plan. she agrees with it. She will call with any problems that may develop before her next visit here.   Rulon Eisenmenger, MD _0 (<PARAMETER> error)@ 10:28 AM

## 2014-08-23 NOTE — Assessment & Plan Note (Addendum)
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  Toxicities to chemotherapy 1. Acid reflux: Patient is taking Zantac and we decreased the dosage of Decadron. Patient is trying not to sleep after she eats food at dinner. 2. Chest pressure: Probably related to #1  Monitoring her closely for toxicities to chemotherapy Her blood counts were reviewed and she has adequate for her chemotherapy.  Iron deficiency anemia: Patient resumed oral iron therapy once a day.She is currently on Zoladex injections to stop her menstrual cycles which have been very heavy previously. We are awaiting today's results of iron studies. Hemoglobin today is 8.8.  Return to clinic in 2 weeks for cycle 3.

## 2014-08-23 NOTE — Patient Instructions (Signed)
[place] Discharge Instructions for Patients Receiving Chemotherapy  Today you received the following chemotherapy agents Adriamycin and Cytoxan.  To help prevent nausea and vomiting after your treatment, we encourage you to take your nausea medication.   If you develop nausea and vomiting that is not controlled by your nausea medication, call the clinic.   BELOW ARE SYMPTOMS THAT SHOULD BE REPORTED IMMEDIATELY:  *FEVER GREATER THAN 100.5 F  *CHILLS WITH OR WITHOUT FEVER  NAUSEA AND VOMITING THAT IS NOT CONTROLLED WITH YOUR NAUSEA MEDICATION  *UNUSUAL SHORTNESS OF BREATH  *UNUSUAL BRUISING OR BLEEDING  TENDERNESS IN MOUTH AND THROAT WITH OR WITHOUT PRESENCE OF ULCERS  *URINARY PROBLEMS  *BOWEL PROBLEMS  UNUSUAL RASH Items with * indicate a potential emergency and should be followed up as soon as possible.  Feel free to call the clinic you have any questions or concerns. The clinic phone number is [phone].

## 2014-08-23 NOTE — Telephone Encounter (Signed)
per pof to sch pt appt-gave pt copy of sch °

## 2014-08-24 ENCOUNTER — Ambulatory Visit (HOSPITAL_BASED_OUTPATIENT_CLINIC_OR_DEPARTMENT_OTHER): Payer: Medicaid Other

## 2014-08-24 ENCOUNTER — Ambulatory Visit: Payer: No Typology Code available for payment source | Admitting: Hematology and Oncology

## 2014-08-24 ENCOUNTER — Other Ambulatory Visit: Payer: No Typology Code available for payment source

## 2014-08-24 DIAGNOSIS — Z5189 Encounter for other specified aftercare: Secondary | ICD-10-CM

## 2014-08-24 DIAGNOSIS — C50411 Malignant neoplasm of upper-outer quadrant of right female breast: Secondary | ICD-10-CM

## 2014-08-24 DIAGNOSIS — C773 Secondary and unspecified malignant neoplasm of axilla and upper limb lymph nodes: Secondary | ICD-10-CM

## 2014-08-24 MED ORDER — PEGFILGRASTIM INJECTION 6 MG/0.6ML ~~LOC~~
6.0000 mg | PREFILLED_SYRINGE | Freq: Once | SUBCUTANEOUS | Status: AC
  Administered 2014-08-24: 6 mg via SUBCUTANEOUS
  Filled 2014-08-24: qty 0.6

## 2014-08-24 NOTE — Patient Instructions (Signed)
Pegfilgrastim injection What is this medicine? PEGFILGRASTIM (peg fil GRA stim) is a long-acting granulocyte colony-stimulating factor that stimulates the growth of neutrophils, a type of white blood cell important in the body's fight against infection. It is used to reduce the incidence of fever and infection in patients with certain types of cancer who are receiving chemotherapy that affects the bone marrow. This medicine may be used for other purposes; ask your health care provider or pharmacist if you have questions. COMMON BRAND NAME(S): Neulasta What should I tell my health care provider before I take this medicine? They need to know if you have any of these conditions: -latex allergy -ongoing radiation therapy -sickle cell disease -skin reactions to acrylic adhesives (On-Body Injector only) -an unusual or allergic reaction to pegfilgrastim, filgrastim, other medicines, foods, dyes, or preservatives -pregnant or trying to get pregnant -breast-feeding How should I use this medicine? This medicine is for injection under the skin. If you get this medicine at home, you will be taught how to prepare and give the pre-filled syringe or how to use the On-body Injector. Refer to the patient Instructions for Use for detailed instructions. Use exactly as directed. Take your medicine at regular intervals. Do not take your medicine more often than directed. It is important that you put your used needles and syringes in a special sharps container. Do not put them in a trash can. If you do not have a sharps container, call your pharmacist or healthcare provider to get one. Talk to your pediatrician regarding the use of this medicine in children. Special care may be needed. Overdosage: If you think you have taken too much of this medicine contact a poison control center or emergency room at once. NOTE: This medicine is only for you. Do not share this medicine with others. What if I miss a dose? It is  important not to miss your dose. Call your doctor or health care professional if you miss your dose. If you miss a dose due to an On-body Injector failure or leakage, a new dose should be administered as soon as possible using a single prefilled syringe for manual use. What may interact with this medicine? Interactions have not been studied. Give your health care provider a list of all the medicines, herbs, non-prescription drugs, or dietary supplements you use. Also tell them if you smoke, drink alcohol, or use illegal drugs. Some items may interact with your medicine. This list may not describe all possible interactions. Give your health care provider a list of all the medicines, herbs, non-prescription drugs, or dietary supplements you use. Also tell them if you smoke, drink alcohol, or use illegal drugs. Some items may interact with your medicine. What should I watch for while using this medicine? You may need blood work done while you are taking this medicine. If you are going to need a MRI, CT scan, or other procedure, tell your doctor that you are using this medicine (On-Body Injector only). What side effects may I notice from receiving this medicine? Side effects that you should report to your doctor or health care professional as soon as possible: -allergic reactions like skin rash, itching or hives, swelling of the face, lips, or tongue -dizziness -fever -pain, redness, or irritation at site where injected -pinpoint red spots on the skin -shortness of breath or breathing problems -stomach or side pain, or pain at the shoulder -swelling -tiredness -trouble passing urine Side effects that usually do not require medical attention (report to your doctor   or health care professional if they continue or are bothersome): -bone pain -muscle pain This list may not describe all possible side effects. Call your doctor for medical advice about side effects. You may report side effects to FDA at  1-800-FDA-1088. Where should I keep my medicine? Keep out of the reach of children. Store pre-filled syringes in a refrigerator between 2 and 8 degrees C (36 and 46 degrees F). Do not freeze. Keep in carton to protect from light. Throw away this medicine if it is left out of the refrigerator for more than 48 hours. Throw away any unused medicine after the expiration date. NOTE: This sheet is a summary. It may not cover all possible information. If you have questions about this medicine, talk to your doctor, pharmacist, or health care provider.  2015, [employer]vier/Gold Standard. (2013-10-27 16:14:05)  

## 2014-08-25 LAB — HEMOGLOBINOPATHY EVALUATION
HEMOGLOBIN OTHER: 0 %
HGB F QUANT: 3.9 % — AB (ref 0.0–2.0)
Hgb A2 Quant: 2.3 % (ref 2.2–3.2)
Hgb A: 93.8 % — ABNORMAL LOW (ref 96.8–97.8)
Hgb S Quant: 0 %

## 2014-09-05 ENCOUNTER — Telehealth: Payer: Self-pay | Admitting: Hematology and Oncology

## 2014-09-05 ENCOUNTER — Other Ambulatory Visit (HOSPITAL_BASED_OUTPATIENT_CLINIC_OR_DEPARTMENT_OTHER): Payer: Medicaid Other

## 2014-09-05 ENCOUNTER — Telehealth: Payer: Self-pay | Admitting: *Deleted

## 2014-09-05 ENCOUNTER — Ambulatory Visit (HOSPITAL_BASED_OUTPATIENT_CLINIC_OR_DEPARTMENT_OTHER): Payer: Medicaid Other | Admitting: Hematology and Oncology

## 2014-09-05 VITALS — BP 157/95 | HR 65 | Temp 98.1°F | Resp 18 | Ht 65.0 in | Wt 191.9 lb

## 2014-09-05 DIAGNOSIS — C50411 Malignant neoplasm of upper-outer quadrant of right female breast: Secondary | ICD-10-CM

## 2014-09-05 DIAGNOSIS — C773 Secondary and unspecified malignant neoplasm of axilla and upper limb lymph nodes: Secondary | ICD-10-CM

## 2014-09-05 DIAGNOSIS — R439 Unspecified disturbances of smell and taste: Secondary | ICD-10-CM

## 2014-09-05 DIAGNOSIS — D509 Iron deficiency anemia, unspecified: Secondary | ICD-10-CM

## 2014-09-05 DIAGNOSIS — R0789 Other chest pain: Secondary | ICD-10-CM

## 2014-09-05 DIAGNOSIS — Z170 Estrogen receptor positive status [ER+]: Secondary | ICD-10-CM

## 2014-09-05 DIAGNOSIS — L659 Nonscarring hair loss, unspecified: Secondary | ICD-10-CM

## 2014-09-05 DIAGNOSIS — K219 Gastro-esophageal reflux disease without esophagitis: Secondary | ICD-10-CM

## 2014-09-05 LAB — COMPREHENSIVE METABOLIC PANEL (CC13)
ALT: 17 U/L (ref 0–55)
AST: 15 U/L (ref 5–34)
Albumin: 3.9 g/dL (ref 3.5–5.0)
Alkaline Phosphatase: 92 U/L (ref 40–150)
Anion Gap: 8 mEq/L (ref 3–11)
BILIRUBIN TOTAL: 0.25 mg/dL (ref 0.20–1.20)
BUN: 9 mg/dL (ref 7.0–26.0)
CHLORIDE: 108 meq/L (ref 98–109)
CO2: 26 meq/L (ref 22–29)
Calcium: 8.8 mg/dL (ref 8.4–10.4)
Creatinine: 0.9 mg/dL (ref 0.6–1.1)
EGFR: >90 mL/min/{1.73_m2} (ref >90)
Glucose: 107 mg/dl (ref 70–140)
Potassium: 3.7 mEq/L (ref 3.5–5.1)
SODIUM: 142 meq/L (ref 136–145)
Total Protein: 6.5 g/dL (ref 6.4–8.3)

## 2014-09-05 LAB — CBC WITH DIFFERENTIAL/PLATELET
BASO%: 0.6 % (ref 0.0–2.0)
BASOS ABS: 0 10*3/uL (ref 0.0–0.1)
EOS%: 0.4 % (ref 0.0–7.0)
Eosinophils Absolute: 0 10*3/uL (ref 0.0–0.5)
HEMATOCRIT: 32 % — AB (ref 34.8–46.6)
HGB: 9.8 g/dL — ABNORMAL LOW (ref 11.6–15.9)
LYMPH%: 19.1 % (ref 14.0–49.7)
MCH: 26.8 pg (ref 25.1–34.0)
MCHC: 30.6 g/dL — AB (ref 31.5–36.0)
MCV: 87.7 fL (ref 79.5–101.0)
MONO#: 0.5 10*3/uL (ref 0.1–0.9)
MONO%: 6.5 % (ref 0.0–14.0)
NEUT%: 73.4 % (ref 38.4–76.8)
NEUTROS ABS: 5.3 10*3/uL (ref 1.5–6.5)
Platelets: 290 10*3/uL (ref 145–400)
RBC: 3.65 10*6/uL — AB (ref 3.70–5.45)
RDW: 25.1 % — ABNORMAL HIGH (ref 11.2–14.5)
WBC: 7.2 10*3/uL (ref 3.9–10.3)
lymph#: 1.4 10*3/uL (ref 0.9–3.3)

## 2014-09-05 NOTE — Progress Notes (Signed)
Patient Care Team: No Pcp Per Patient as PCP - General (General Practice) Rolm Bookbinder, MD as Consulting Physician (General Surgery) Rulon Eisenmenger, MD as Consulting Physician (Hematology and Oncology) Thea Silversmith, MD as Consulting Physician (Radiation Oncology) Trinda Pascal, NP as Nurse Practitioner (Nurse Practitioner) Carola Frost, RN as Registered Nurse (Oncology)  DIAGNOSIS: Breast cancer of upper-outer quadrant of right female breast   Staging form: Breast, AJCC 7th Edition     Clinical stage from 07/26/2014: Stage IIB (T2, N1, M0) - Unsigned       Staging comments: Staged at breast conference on 12.16.15    SUMMARY OF ONCOLOGIC HISTORY:   Breast cancer of upper-outer quadrant of right female breast   07/13/2014 Mammogram indeterminate mass in the right breast by ultrasound measured 2 cm at 10:00 position   07/20/2014 Initial Diagnosis right breast biopsy: Invasive ductal carcinoma, 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 She is tolerating Adriamycin Cytoxan 6 extremely well. She denies any major problems. She had heartburn-like symptoms which improved with Zantac. She had complete alopecia and she shaved her head. Other than that there is no new changes or problems. Denies any nausea vomiting denies any fevers or chills.  REVIEW OF SYSTEMS:   Constitutional: Denies fevers, chills or abnormal weight loss, alopecia Eyes: Denies blurriness of vision Ears, nose, mouth, throat, and face: Denies mucositis or sore throat Respiratory: Denies  cough, dyspnea or wheezes Cardiovascular: Denies palpitation, chest discomfort or lower extremity swelling Gastrointestinal:  Denies nausea, heartburn or change in bowel habits Skin: Denies abnormal skin rashes Lymphatics: Denies new lymphadenopathy or easy bruising Neurological:Denies numbness, tingling or new weaknesses Behavioral/Psych: Mood is stable, no new changes   All other systems were reviewed with the patient and are negative.  I have reviewed the past medical history, past surgical history, social history and family history with the patient and they are unchanged from previous note.  ALLERGIES:  has No Known Allergies.  MEDICATIONS:  Current Outpatient Prescriptions  Medication Sig Dispense Refill  . Atorvastatin Calcium (INVESTIGATIONAL ATORVASTATIN/PLACEBO) 40 MG tablet [facility] [id number] Take 1 tablet by mouth daily. Take 2 doses (these doses must be 12 hours apart) prior to first chemotherapy treatment. Then take 1 tablet daily by mouth with or without food. 180 tablet 0  . dexamethasone (DECADRON) 4 MG tablet Take 2 tablets by mouth once a day on the day after chemotherapy and then take 2 tablets two times a day for 2 days. Take with food. 30 tablet 1  . ferrous fumarate (HEMOCYTE - 106 MG FE) 325 (106 FE) MG TABS tablet Take 1 tablet by mouth daily.    [facility] lidocaine-prilocaine (EMLA) cream Apply to affected area once 30 g 3  . LORazepam (ATIVAN) 0.5 MG tablet Take 1 tablet (0.5 mg total) by mouth every 6 (six) hours as needed (Nausea or vomiting). 30 tablet 0  . ondansetron (ZOFRAN) 8 MG tablet Take 1 tablet (8 mg total) by mouth 2 (two) times daily as needed. Start on the third day after chemotherapy. 30 tablet 1  . prochlorperazine (COMPAZINE) 10 MG tablet Take 1  tablet (10 mg total) by mouth every 6 (six) hours as needed (Nausea or vomiting). 30 tablet 1  . ranitidine (ZANTAC) 300 MG tablet Take 1 tablet (300 mg total) by mouth at bedtime. 30 tablet 0   No current  facility-administered medications for this visit.    PHYSICAL EXAMINATION: ECOG PERFORMANCE STATUS: 0 - Asymptomatic  Filed Vitals:   09/05/14 0832  BP: 157/95  Pulse: 65  Temp: 98.1 F (36.7 C)  Resp: 18   Filed Weights   09/05/14 0832  Weight: 191 lb 14.4 oz (87.045 kg)    GENERAL:alert, no distress and comfortable SKIN: skin color, texture, turgor are normal, no rashes or significant lesions EYES: normal, Conjunctiva are pink and non-injected, sclera clear OROPHARYNX:no exudate, no erythema and lips, buccal mucosa, and tongue normal  NECK: supple, thyroid normal size, non-tender, without nodularity LYMPH:  no palpable lymphadenopathy in the cervical, axillary or inguinal LUNGS: clear to auscultation and percussion with normal breathing effort HEART: regular rate & rhythm and no murmurs and no lower extremity edema ABDOMEN:abdomen soft, non-tender and normal bowel sounds Musculoskeletal:no cyanosis of digits and no clubbing  NEURO: alert & oriented x 3 with fluent speech, no focal motor/sensory deficits  LABORATORY DATA:  I have reviewed the data as listed   Chemistry      Component Value Date/Time   NA 142 09/05/2014 0815   NA 138 01/22/2011 2330   K 3.7 09/05/2014 0815   K 3.6 01/22/2011 2330   CL 104 01/22/2011 2330   CO2 26 09/05/2014 0815   CO2 28 01/22/2011 2330   BUN 9.0 09/05/2014 0815   BUN 9 01/22/2011 2330   CREATININE 0.9 09/05/2014 0815   CREATININE 0.72 01/22/2011 2330      Component Value Date/Time   CALCIUM 8.8 09/05/2014 0815   CALCIUM 9.2 01/22/2011 2330   ALKPHOS 92 09/05/2014 0815   ALKPHOS 55 01/22/2011 2330   AST 15 09/05/2014 0815   AST 21 01/22/2011 2330   ALT 17 09/05/2014 0815   ALT 15 01/22/2011 2330   BILITOT 0.25 09/05/2014 0815   BILITOT 0.2* 01/22/2011 2330       Lab Results  Component Value Date   WBC 7.2 09/05/2014   HGB 9.8* 09/05/2014   HCT 32.0* 09/05/2014   MCV 87.7 09/05/2014   PLT 290 09/05/2014   NEUTROABS  5.3 09/05/2014   ASSESSMENT & PLAN:  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  Toxicities to chemotherapy 1. Acid reflux: Patient is taking Zantac and we decreased the dosage of Decadron. Patient is trying not to sleep after she eats food at dinner. 2. Chest pressure: Probably related to #1: Resolved with Zantac 3. Alopecia 4. Loss of taste  Monitoring her closely for toxicities to chemotherapy Her blood counts were reviewed and she has adequate for her chemotherapy.  Iron deficiency anemia: Patient stopped taking oral iron.She is also receiving Zoladex injections to stop her menstrual cycles which have been very heavy previously. Iron studies revealed 27% saturation with a ferritin of 115 Hemoglobin today is 9.8. Hemoglobin electrophoresis did not reveal any hemoglobinopathies.  Return to clinic in 2 weeks for cycle 4 of Adriamycin Cytoxan this will be followed by Taxol X 12    Orders Placed This Encounter  Procedures  . CBC with Differential    Standing Status: Future     Number of Occurrences:      Standing Expiration Date: 09/05/2015  . Comprehensive metabolic panel (Cmet) - [facility]    Standing Status: Future     Number of Occurrences:      Standing Expiration Date: 09/05/2015   The patient has a good understanding of the overall plan. she agrees with it. She will call with any problems that may develop before her next visit here.   Rulon Eisenmenger, MD

## 2014-09-05 NOTE — Telephone Encounter (Signed)
, °

## 2014-09-05 NOTE — Telephone Encounter (Signed)
Per staff message and POF I have scheduled appts. Advised scheduler of appts. JMW  

## 2014-09-05 NOTE — Assessment & Plan Note (Signed)
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  Toxicities to chemotherapy 1. Acid reflux: Patient is taking Zantac and we decreased the dosage of Decadron. Patient is trying not to sleep after she eats food at dinner. 2. Chest pressure: Probably related to #1  Monitoring her closely for toxicities to chemotherapy Her blood counts were reviewed and she has adequate for her chemotherapy.  Iron deficiency anemia: Patient is currently on oral iron therapy once a day.She is also receiving Zoladex injections to stop her menstrual cycles which have been very heavy previously. Iron studies revealed 27% saturation with a ferritin of 115 Hemoglobin today is .  Return to clinic in 2 weeks for cycle 4

## 2014-09-06 ENCOUNTER — Ambulatory Visit (HOSPITAL_BASED_OUTPATIENT_CLINIC_OR_DEPARTMENT_OTHER): Payer: Medicaid Other

## 2014-09-06 ENCOUNTER — Other Ambulatory Visit: Payer: Self-pay | Admitting: Hematology and Oncology

## 2014-09-06 ENCOUNTER — Encounter: Payer: Self-pay | Admitting: *Deleted

## 2014-09-06 ENCOUNTER — Other Ambulatory Visit: Payer: No Typology Code available for payment source

## 2014-09-06 DIAGNOSIS — C773 Secondary and unspecified malignant neoplasm of axilla and upper limb lymph nodes: Secondary | ICD-10-CM

## 2014-09-06 DIAGNOSIS — C50411 Malignant neoplasm of upper-outer quadrant of right female breast: Secondary | ICD-10-CM | POA: Diagnosis not present

## 2014-09-06 DIAGNOSIS — Z5111 Encounter for antineoplastic chemotherapy: Secondary | ICD-10-CM

## 2014-09-06 MED ORDER — SODIUM CHLORIDE 0.9 % IV SOLN
600.0000 mg/m2 | Freq: Once | INTRAVENOUS | Status: AC
  Administered 2014-09-06: 1200 mg via INTRAVENOUS
  Filled 2014-09-06: qty 60

## 2014-09-06 MED ORDER — DEXAMETHASONE SODIUM PHOSPHATE 20 MG/5ML IJ SOLN
INTRAMUSCULAR | Status: AC
  Filled 2014-09-06: qty 5

## 2014-09-06 MED ORDER — HEPARIN SOD (PORK) LOCK FLUSH 100 UNIT/ML IV SOLN
500.0000 [IU] | Freq: Once | INTRAVENOUS | Status: AC | PRN
  Administered 2014-09-06: 500 [IU]
  Filled 2014-09-06: qty 5

## 2014-09-06 MED ORDER — SODIUM CHLORIDE 0.9 % IV SOLN
Freq: Once | INTRAVENOUS | Status: AC
  Administered 2014-09-06: 12:00:00 via INTRAVENOUS

## 2014-09-06 MED ORDER — DOXORUBICIN HCL CHEMO IV INJECTION 2 MG/ML
60.0000 mg/m2 | Freq: Once | INTRAVENOUS | Status: AC
  Administered 2014-09-06: 120 mg via INTRAVENOUS
  Filled 2014-09-06: qty 60

## 2014-09-06 MED ORDER — SODIUM CHLORIDE 0.9 % IJ SOLN
10.0000 mL | INTRAMUSCULAR | Status: DC | PRN
  Administered 2014-09-06: 10 mL
  Filled 2014-09-06: qty 10

## 2014-09-06 MED ORDER — PALONOSETRON HCL INJECTION 0.25 MG/5ML
INTRAVENOUS | Status: AC
  Filled 2014-09-06: qty 5

## 2014-09-06 MED ORDER — SODIUM CHLORIDE 0.9 % IV SOLN
150.0000 mg | Freq: Once | INTRAVENOUS | Status: AC
  Administered 2014-09-06: 150 mg via INTRAVENOUS
  Filled 2014-09-06: qty 5

## 2014-09-06 MED ORDER — DEXAMETHASONE SODIUM PHOSPHATE 20 MG/5ML IJ SOLN
12.0000 mg | Freq: Once | INTRAMUSCULAR | Status: AC
  Administered 2014-09-06: 12 mg via INTRAVENOUS

## 2014-09-06 MED ORDER — PALONOSETRON HCL INJECTION 0.25 MG/5ML
0.2500 mg | Freq: Once | INTRAVENOUS | Status: AC
  Administered 2014-09-06: 0.25 mg via INTRAVENOUS

## 2014-09-06 NOTE — Patient Instructions (Signed)
[place] Discharge Instructions for Patients Receiving Chemotherapy  Today you received the following chemotherapy agents Adriamycin/Cytoxan  To help prevent nausea and vomiting after your treatment, we encourage you to take your nausea medication     If you develop nausea and vomiting that is not controlled by your nausea medication, call the clinic.   BELOW ARE SYMPTOMS THAT SHOULD BE REPORTED IMMEDIATELY:  *FEVER GREATER THAN 100.5 F  *CHILLS WITH OR WITHOUT FEVER  NAUSEA AND VOMITING THAT IS NOT CONTROLLED WITH YOUR NAUSEA MEDICATION  *UNUSUAL SHORTNESS OF BREATH  *UNUSUAL BRUISING OR BLEEDING  TENDERNESS IN MOUTH AND THROAT WITH OR WITHOUT PRESENCE OF ULCERS  *URINARY PROBLEMS  *BOWEL PROBLEMS  UNUSUAL RASH Items with * indicate a potential emergency and should be followed up as soon as possible.  Feel free to call the clinic you have any questions or concerns. The clinic phone number is [phone].

## 2014-09-06 NOTE — Progress Notes (Signed)
09/06/2014 [place] study Met with patient while in clinic for treatment.  Patient stated she has not experienced any toxicities related to study medication.  She denies abdominal pain, headache, muscle pain, constipation, etc.  She stated she has not missed any of study medication and that she is keeping her calendars up to date.Patient was without questions.  This RN thanked patient for her participation on the trial.

## 2014-09-07 ENCOUNTER — Ambulatory Visit (HOSPITAL_BASED_OUTPATIENT_CLINIC_OR_DEPARTMENT_OTHER): Payer: Medicaid Other

## 2014-09-07 DIAGNOSIS — Z5189 Encounter for other specified aftercare: Secondary | ICD-10-CM

## 2014-09-07 DIAGNOSIS — C773 Secondary and unspecified malignant neoplasm of axilla and upper limb lymph nodes: Secondary | ICD-10-CM

## 2014-09-07 DIAGNOSIS — C50411 Malignant neoplasm of upper-outer quadrant of right female breast: Secondary | ICD-10-CM

## 2014-09-07 MED ORDER — PEGFILGRASTIM INJECTION 6 MG/0.6ML ~~LOC~~
6.0000 mg | PREFILLED_SYRINGE | Freq: Once | SUBCUTANEOUS | Status: AC
  Administered 2014-09-07: 6 mg via SUBCUTANEOUS
  Filled 2014-09-07: qty 0.6

## 2014-09-18 ENCOUNTER — Telehealth: Payer: Self-pay | Admitting: Genetic Counselor

## 2014-09-18 ENCOUNTER — Encounter: Payer: Self-pay | Admitting: Genetic Counselor

## 2014-09-18 DIAGNOSIS — Z1379 Encounter for other screening for genetic and chromosomal anomalies: Secondary | ICD-10-CM | POA: Insufficient documentation

## 2014-09-18 NOTE — Telephone Encounter (Signed)
LM on VM that we had good news on genetic test results

## 2014-09-19 ENCOUNTER — Other Ambulatory Visit (HOSPITAL_BASED_OUTPATIENT_CLINIC_OR_DEPARTMENT_OTHER): Payer: Medicaid Other

## 2014-09-19 ENCOUNTER — Telehealth: Payer: Self-pay | Admitting: Hematology and Oncology

## 2014-09-19 ENCOUNTER — Ambulatory Visit (HOSPITAL_BASED_OUTPATIENT_CLINIC_OR_DEPARTMENT_OTHER): Payer: Medicaid Other

## 2014-09-19 ENCOUNTER — Telehealth: Payer: Self-pay | Admitting: *Deleted

## 2014-09-19 ENCOUNTER — Ambulatory Visit (HOSPITAL_BASED_OUTPATIENT_CLINIC_OR_DEPARTMENT_OTHER): Payer: Medicaid Other | Admitting: Hematology and Oncology

## 2014-09-19 VITALS — BP 158/99 | HR 68 | Temp 98.4°F | Resp 19 | Ht 65.0 in | Wt 191.3 lb

## 2014-09-19 DIAGNOSIS — Z5112 Encounter for antineoplastic immunotherapy: Secondary | ICD-10-CM

## 2014-09-19 DIAGNOSIS — C50411 Malignant neoplasm of upper-outer quadrant of right female breast: Secondary | ICD-10-CM

## 2014-09-19 DIAGNOSIS — Z006 Encounter for examination for normal comparison and control in clinical research program: Secondary | ICD-10-CM

## 2014-09-19 DIAGNOSIS — D509 Iron deficiency anemia, unspecified: Secondary | ICD-10-CM

## 2014-09-19 DIAGNOSIS — Z170 Estrogen receptor positive status [ER+]: Secondary | ICD-10-CM

## 2014-09-19 LAB — CBC WITH DIFFERENTIAL/PLATELET
BASO%: 0.5 % (ref 0.0–2.0)
BASOS ABS: 0.1 10*3/uL (ref 0.0–0.1)
EOS ABS: 0.1 10*3/uL (ref 0.0–0.5)
EOS%: 0.6 % (ref 0.0–7.0)
HCT: 32.5 % — ABNORMAL LOW (ref 34.8–46.6)
HGB: 10.1 g/dL — ABNORMAL LOW (ref 11.6–15.9)
LYMPH%: 12.8 % — AB (ref 14.0–49.7)
MCH: 27.7 pg (ref 25.1–34.0)
MCHC: 31.1 g/dL — ABNORMAL LOW (ref 31.5–36.0)
MCV: 89 fL (ref 79.5–101.0)
MONO#: 0.7 10*3/uL (ref 0.1–0.9)
MONO%: 6.7 % (ref 0.0–14.0)
NEUT#: 8.4 10*3/uL — ABNORMAL HIGH (ref 1.5–6.5)
NEUT%: 79.4 % — AB (ref 38.4–76.8)
PLATELETS: 282 10*3/uL (ref 145–400)
RBC: 3.65 10*6/uL — AB (ref 3.70–5.45)
RDW: 23.6 % — ABNORMAL HIGH (ref 11.2–14.5)
WBC: 10.6 10*3/uL — ABNORMAL HIGH (ref 3.9–10.3)
lymph#: 1.4 10*3/uL (ref 0.9–3.3)

## 2014-09-19 LAB — COMPREHENSIVE METABOLIC PANEL (CC13)
ALK PHOS: 101 U/L (ref 40–150)
ALT: 14 U/L (ref 0–55)
AST: 13 U/L (ref 5–34)
Albumin: 3.8 g/dL (ref 3.5–5.0)
Anion Gap: 9 mEq/L (ref 3–11)
BUN: 6 mg/dL — ABNORMAL LOW (ref 7.0–26.0)
CHLORIDE: 109 meq/L (ref 98–109)
CO2: 25 mEq/L (ref 22–29)
Calcium: 8.8 mg/dL (ref 8.4–10.4)
Creatinine: 0.8 mg/dL (ref 0.6–1.1)
Glucose: 95 mg/dl (ref 70–140)
Potassium: 3.9 mEq/L (ref 3.5–5.1)
Sodium: 142 mEq/L (ref 136–145)
Total Bilirubin: <0.2 mg/dL (ref 0.20–1.20)
Total Protein: 6.5 g/dL (ref 6.4–8.3)

## 2014-09-19 MED ORDER — SODIUM CHLORIDE 0.9 % IV SOLN
Freq: Once | INTRAVENOUS | Status: AC
  Administered 2014-09-19: 10:00:00 via INTRAVENOUS

## 2014-09-19 MED ORDER — PALONOSETRON HCL INJECTION 0.25 MG/5ML
INTRAVENOUS | Status: AC
  Filled 2014-09-19: qty 5

## 2014-09-19 MED ORDER — DEXAMETHASONE SODIUM PHOSPHATE 20 MG/5ML IJ SOLN
INTRAMUSCULAR | Status: AC
  Filled 2014-09-19: qty 5

## 2014-09-19 MED ORDER — DOXORUBICIN HCL CHEMO IV INJECTION 2 MG/ML
60.0000 mg/m2 | Freq: Once | INTRAVENOUS | Status: AC
  Administered 2014-09-19: 120 mg via INTRAVENOUS
  Filled 2014-09-19: qty 60

## 2014-09-19 MED ORDER — HEPARIN SOD (PORK) LOCK FLUSH 100 UNIT/ML IV SOLN
500.0000 [IU] | Freq: Once | INTRAVENOUS | Status: AC | PRN
  Administered 2014-09-19: 500 [IU]
  Filled 2014-09-19: qty 5

## 2014-09-19 MED ORDER — DEXAMETHASONE SODIUM PHOSPHATE 20 MG/5ML IJ SOLN
12.0000 mg | Freq: Once | INTRAMUSCULAR | Status: AC
  Administered 2014-09-19: 12 mg via INTRAVENOUS

## 2014-09-19 MED ORDER — SODIUM CHLORIDE 0.9 % IV SOLN
600.0000 mg/m2 | Freq: Once | INTRAVENOUS | Status: AC
  Administered 2014-09-19: 1200 mg via INTRAVENOUS
  Filled 2014-09-19: qty 60

## 2014-09-19 MED ORDER — PALONOSETRON HCL INJECTION 0.25 MG/5ML
0.2500 mg | Freq: Once | INTRAVENOUS | Status: AC
  Administered 2014-09-19: 0.25 mg via INTRAVENOUS

## 2014-09-19 MED ORDER — SODIUM CHLORIDE 0.9 % IV SOLN
150.0000 mg | Freq: Once | INTRAVENOUS | Status: AC
  Administered 2014-09-19: 150 mg via INTRAVENOUS
  Filled 2014-09-19: qty 5

## 2014-09-19 MED ORDER — SODIUM CHLORIDE 0.9 % IJ SOLN
10.0000 mL | INTRAMUSCULAR | Status: DC | PRN
  Administered 2014-09-19: 10 mL
  Filled 2014-09-19: qty 10

## 2014-09-19 MED ORDER — LORAZEPAM 0.5 MG PO TABS: 0.5000 mg | ORAL_TABLET | Freq: Four times a day (QID) | ORAL | Status: DC | PRN

## 2014-09-19 MED ORDER — GABAPENTIN 300 MG PO CAPS: 300.0000 mg | ORAL_CAPSULE | Freq: Every day | ORAL | Status: DC

## 2014-09-19 NOTE — Telephone Encounter (Signed)
, °

## 2014-09-19 NOTE — Patient Instructions (Signed)
[place] Discharge Instructions for Patients Receiving Chemotherapy  Today you received the following chemotherapy agents adriamycin, cytoxan  To help prevent nausea and vomiting after your treatment, we encourage you to take your nausea medication as prescribed by MD   If you develop nausea and vomiting that is not controlled by your nausea medication, call the clinic.   BELOW ARE SYMPTOMS THAT SHOULD BE REPORTED IMMEDIATELY:  *FEVER GREATER THAN 100.5 F  *CHILLS WITH OR WITHOUT FEVER  NAUSEA AND VOMITING THAT IS NOT CONTROLLED WITH YOUR NAUSEA MEDICATION  *UNUSUAL SHORTNESS OF BREATH  *UNUSUAL BRUISING OR BLEEDING  TENDERNESS IN MOUTH AND THROAT WITH OR WITHOUT PRESENCE OF ULCERS  *URINARY PROBLEMS  *BOWEL PROBLEMS  UNUSUAL RASH Items with * indicate a potential emergency and should be followed up as soon as possible.  Feel free to call the clinic you have any questions or concerns. The clinic phone number is [phone].

## 2014-09-19 NOTE — Telephone Encounter (Signed)
Per staff message and POF I have scheduled appts. Advised scheduler of appts. JMW  

## 2014-09-19 NOTE — Progress Notes (Signed)
Patient Care Team: No Pcp Per Patient as PCP - General (General Practice) Rolm Bookbinder, MD as Consulting Physician (General Surgery) Rulon Eisenmenger, MD as Consulting Physician (Hematology and Oncology) Thea Silversmith, MD as Consulting Physician (Radiation Oncology) Trinda Pascal, NP as Nurse Practitioner (Nurse Practitioner) Carola Frost, RN as Registered Nurse (Oncology)  DIAGNOSIS: Breast cancer of upper-outer quadrant of right female breast   Staging form: Breast, AJCC 7th Edition     Clinical stage from 07/26/2014: Stage IIB (T2, N1, M0) - Unsigned       Staging comments: Staged at breast conference on 12.16.15    SUMMARY OF ONCOLOGIC HISTORY:   Breast cancer of upper-outer quadrant of right female breast   07/13/2014 Mammogram indeterminate mass in the right breast by ultrasound measured 2 cm at 10:00 position   07/20/2014 Initial Diagnosis right breast biopsy: Invasive ductal carcinoma, 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 Today she is receiving cycle 4 of dose dense Adriamycin and Cytoxan. She has been tolerating chemotherapy fairly well without any major problems or concerns.apart from taste changes and alopecia, she has not had any trouble with treatment. Taste changes, alopecia, heartburn, dark spots in the tongue, hard flashes at night  REVIEW OF SYSTEMS:   Constitutional: Denies fevers, chills or abnormal weight loss, alopecia, loss of taste Eyes: Denies blurriness of vision Ears, nose, mouth, throat,  and face: Denies mucositis or sore throat Respiratory: Denies cough, dyspnea or wheezes Cardiovascular: Denies palpitation, chest discomfort or lower extremity swelling Gastrointestinal:  Denies nausea, heartburn or change in bowel habits Skin: Denies abnormal skin rashes Lymphatics: Denies new lymphadenopathy or easy bruising Neurological:Denies numbness, tingling or new weaknesses Behavioral/Psych: Mood is stable, no new changes  All other systems were reviewed with the patient and are negative.  I have reviewed the past medical history, past surgical history, social history and family history with the patient and they are unchanged from previous note.  ALLERGIES:  is allergic to tape.  MEDICATIONS:  Current Outpatient Prescriptions  Medication Sig Dispense Refill  . Atorvastatin Calcium (INVESTIGATIONAL ATORVASTATIN/PLACEBO) 40 MG tablet [facility] [id number] Take 1 tablet by mouth daily. Take 2 doses (these doses must be 12 hours apart) prior to first chemotherapy treatment. Then take 1 tablet daily by mouth with or without food. 180 tablet 0  . dexamethasone (DECADRON) 4 MG tablet Take 2 tablets by mouth once a day on the day after chemotherapy and then take 2 tablets two times a day for 2 days. Take with food. 30 tablet 1  . ferrous fumarate (HEMOCYTE - 106 MG FE) 325 (106 FE) MG TABS tablet Take 1 tablet by mouth daily.    Marland Kitchen lidocaine-prilocaine (EMLA) cream Apply to affected area once 30 g 3  . LORazepam (ATIVAN) 0.5 MG tablet Take 1 tablet (0.5 mg total) by mouth every 6 (six) hours as needed (Nausea or vomiting). 30 tablet 3  . ondansetron (ZOFRAN) 8 MG tablet Take 1 tablet (8 mg total) by mouth 2 (two) times daily as needed. Start on the third day after chemotherapy. 30 tablet 1  .  prochlorperazine (COMPAZINE) 10 MG tablet Take 1 tablet (10 mg total) by mouth every 6 (six) hours as needed (Nausea or vomiting). 30 tablet 1  . ranitidine (ZANTAC) 300 MG tablet Take 1 tablet (300 mg  total) by mouth at bedtime. 30 tablet 0  . gabapentin (NEURONTIN) 300 MG capsule Take 1 capsule (300 mg total) by mouth at bedtime. 30 capsule 3   No current facility-administered medications for this visit.    PHYSICAL EXAMINATION: ECOG PERFORMANCE STATUS: 1 - Symptomatic but completely ambulatory  Filed Vitals:   09/19/14 0835  BP: 158/99  Pulse: 68  Temp: 98.4 F (36.9 C)  Resp: 19   Filed Weights   09/19/14 0835  Weight: 191 lb 4.8 oz (86.773 kg)    GENERAL:alert, no distress and comfortable SKIN: skin color, texture, turgor are normal, no rashes or significant lesions EYES: normal, Conjunctiva are pink and non-injected, sclera clear OROPHARYNX:no exudate, no erythema and lips, buccal mucosa, and tongue normal  NECK: supple, thyroid normal size, non-tender, without nodularity LYMPH:  no palpable lymphadenopathy in the cervical, axillary or inguinal LUNGS: clear to auscultation and percussion with normal breathing effort HEART: regular rate & rhythm and no murmurs and no lower extremity edema ABDOMEN:abdomen soft, non-tender and normal bowel sounds Musculoskeletal:no cyanosis of digits and no clubbing  NEURO: alert & oriented x 3 with fluent speech, no focal motor/sensory deficits  LABORATORY DATA:  I have reviewed the data as listed   Chemistry      Component Value Date/Time   NA 142 09/19/2014 0822   NA 138 01/22/2011 2330   K 3.9 09/19/2014 0822   K 3.6 01/22/2011 2330   CL 104 01/22/2011 2330   CO2 25 09/19/2014 0822   CO2 28 01/22/2011 2330   BUN 6.0* 09/19/2014 0822   BUN 9 01/22/2011 2330   CREATININE 0.8 09/19/2014 0822   CREATININE 0.72 01/22/2011 2330      Component Value Date/Time   CALCIUM 8.8 09/19/2014 0822   CALCIUM 9.2 01/22/2011 2330   ALKPHOS 101 09/19/2014 0822   ALKPHOS 55 01/22/2011 2330   AST 13 09/19/2014 0822   AST 21 01/22/2011 2330   ALT 14 09/19/2014 0822   ALT 15 01/22/2011 2330   BILITOT <0.20 09/19/2014 0822   BILITOT 0.2*  01/22/2011 2330       Lab Results  Component Value Date   WBC 10.6* 09/19/2014   HGB 10.1* 09/19/2014   HCT 32.5* 09/19/2014   MCV 89.0 09/19/2014   PLT 282 09/19/2014   NEUTROABS 8.4* 09/19/2014   ASSESSMENT & PLAN:  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  Toxicities to chemotherapy 1. Acid reflux: Patient is taking Zantac and we decreased the dosage of Decadron. Patient is trying not to sleep after she eats food at dinner. 2. Chest pressure: Probably related to #1: Resolved with Zantac 3. Alopecia 4. Loss of taste 5. Hot flashes at night: Prescribed gabapentin 300 mg at bedtime I renewed her Ativan prescription today.  Monitoring her closely for toxicities to chemotherapy Her blood counts were reviewed and she has adequate for her chemotherapy.  Iron deficiency anemia: Patient stopped taking oral iron.She is also receiving Zoladex injections to stop her menstrual cycles which have been very heavy previously. Iron studies revealed 27% saturation with a ferritin of 115. Hemoglobin electrophoresis did not reveal any hemoglobinopathies.hemoglobin today is 10.1  Return to clinic in 2 weeks for Taxol X 12    Orders Placed This Encounter  Procedures  . CBC with Differential    Standing Status: Future     Number of Occurrences:      Standing Expiration Date: 09/19/2015  . Comprehensive metabolic panel (Cmet) - [facility]    Standing Status: Future     Number of Occurrences:      Standing Expiration Date: 09/19/2015   The patient has a good understanding of the overall plan. she agrees  with it. She will call with any problems that may develop before her next visit here.   Rulon Eisenmenger, MD

## 2014-09-19 NOTE — Assessment & Plan Note (Addendum)
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  Toxicities to chemotherapy 1. Acid reflux: Patient is taking Zantac and we decreased the dosage of Decadron. Patient is trying not to sleep after she eats food at dinner. 2. Chest pressure: Probably related to #1: Resolved with Zantac 3. Alopecia 4. Loss of taste 5. Hot flashes at night: Prescribed gabapentin 300 mg at bedtime I renewed her Ativan prescription today.  Monitoring her closely for toxicities to chemotherapy Her blood counts were reviewed and she has adequate for her chemotherapy.  Iron deficiency anemia: Patient stopped taking oral iron.She is also receiving Zoladex injections to stop her menstrual cycles which have been very heavy previously. Iron studies revealed 27% saturation with a ferritin of 115. Hemoglobin electrophoresis did not reveal any hemoglobinopathies.hemoglobin today is 10.1  Return to clinic in 2 weeks for Taxol X 12

## 2014-09-20 ENCOUNTER — Ambulatory Visit (HOSPITAL_BASED_OUTPATIENT_CLINIC_OR_DEPARTMENT_OTHER): Payer: Medicaid Other

## 2014-09-20 ENCOUNTER — Encounter: Payer: Self-pay | Admitting: Genetic Counselor

## 2014-09-20 ENCOUNTER — Telehealth: Payer: Self-pay | Admitting: Genetic Counselor

## 2014-09-20 DIAGNOSIS — Z5189 Encounter for other specified aftercare: Secondary | ICD-10-CM

## 2014-09-20 DIAGNOSIS — C50411 Malignant neoplasm of upper-outer quadrant of right female breast: Secondary | ICD-10-CM

## 2014-09-20 MED ORDER — PEGFILGRASTIM INJECTION 6 MG/0.6ML ~~LOC~~
6.0000 mg | PREFILLED_SYRINGE | Freq: Once | SUBCUTANEOUS | Status: AC
  Administered 2014-09-20: 6 mg via SUBCUTANEOUS
  Filled 2014-09-20: qty 0.6

## 2014-09-20 NOTE — Telephone Encounter (Signed)
Discussed that there were 3 VUS' found on her OvaNext panel testing, but it was an otherwise negative genetic test result.

## 2014-09-20 NOTE — Telephone Encounter (Signed)
Called to provide genetic test results but VM is full. Chose the option to send an SMS notification.  I provided my direct call back number on the notification.

## 2014-09-20 NOTE — Progress Notes (Addendum)
HPI: Ms. Pick was previously seen in the [place] clinic due to a personal and family history of cancer and concerns regarding a hereditary predisposition to cancer. Please refer to our prior cancer genetics clinic note for more information regarding Ms. Peddie's medical, social and family histories, and our assessment and recommendations, at the time. Ms. Hamill recent genetic test results were disclosed to her, as were recommendations warranted by these results. These results and recommendations are discussed in more detail below.  GENETIC TEST RESULTS: At the time of Ms. Reuss's visit, we recommended she pursue genetic testing of the OvaNext gene panel. The OvaNext gene panel offered by [facility] and includes sequencing and rearrangement analysis for the following 24 genes: ATM, BARD1, BRCA1, BRCA2, BRIP1, CDH1, CHEK2, EPCAM, MLH1, MRE11A, MSH2, MSH6, MUTYH, NBN, NF1, PALB2, PMS2, PTEN, RAD50, RAD51C, RAD51D, SMARCA4, STK11, and TP53.  The report date is September 15, 2014.  Testing was performed at [facility].  Genetic testing was delayed due to waiting for Ms. Spizzirri's [place] Medicaid card. Genetic testing revealed three variant's of unknown significance - MRE11A c.256G>C, PMS2 c.1004A>G, and RAD51C c.200A>G - but was otherwise normal, and did not reveal a deleterious mutation in these genes. The test report has been scanned into EPIC and is located under the Media tab.   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  The updated report date is 05/11/2018.  We discussed with Ms. Ennen that since the current genetic testing is not perfect, it is possible there may be a gene mutation in one of these genes that current testing cannot detect, but that chance is small. We also discussed, that it is possible that another gene that has not yet been discovered, or that we have not yet tested, is responsible for the cancer diagnoses in the family,  and it is, therefore, important to remain in touch with cancer genetics in the future so that we can continue to offer Ms. Tiedeman the most up to date genetic testing.   CANCER SCREENING RECOMMENDATIONS: This result is reassuring and suggests that Ms. Munn's personal and family history of cancer was most likely not due to an inherited predisposition associated with one of these genes. Most cancers happen by chance and this negative test, along with details of her family history, suggests that her cancer falls into this category. We, therefore, recommended she continue to follow the cancer management and screening guidelines provided by her oncology and primary providers.   RECOMMENDATIONS FOR FAMILY MEMBERS: Women in this family might be at some increased risk of developing cancer, over the general population risk, simply due to the family history of cancer. We recommended women in this family have a yearly mammogram beginning at age 57, or 57 years younger than the earliest onset of cancer, an an annual clinical breast exam, and perform monthly breast self-exams. Women in this family should also have a gynecological exam as recommended by their primary provider. All family members should have a colonoscopy by age 70.  FOLLOW-UP: Lastly, we discussed with Ms. Buttler that cancer genetics is a rapidly advancing field and it is possible that new genetic tests will be appropriate for her and/or her family members in the future. We encouraged her to remain in contact with cancer genetics on an annual basis so we can update her personal and family histories and let her know of advances in cancer genetics that may benefit this family.   Our contact number was  provided. Ms. Greb questions were answered to her satisfaction, and she knows she is welcome to call us at anytime with additional questions or concerns.   Roma Kayser, MS, [facility] Certified Genetic Counselor [email or web address]

## 2014-09-21 ENCOUNTER — Other Ambulatory Visit: Payer: Self-pay | Admitting: Hematology and Oncology

## 2014-10-03 ENCOUNTER — Other Ambulatory Visit (HOSPITAL_BASED_OUTPATIENT_CLINIC_OR_DEPARTMENT_OTHER): Payer: Medicaid Other

## 2014-10-03 ENCOUNTER — Ambulatory Visit (HOSPITAL_BASED_OUTPATIENT_CLINIC_OR_DEPARTMENT_OTHER): Payer: Medicaid Other | Admitting: Hematology and Oncology

## 2014-10-03 ENCOUNTER — Ambulatory Visit (HOSPITAL_BASED_OUTPATIENT_CLINIC_OR_DEPARTMENT_OTHER): Payer: Medicaid Other

## 2014-10-03 ENCOUNTER — Telehealth: Payer: Self-pay | Admitting: Hematology and Oncology

## 2014-10-03 VITALS — BP 160/99 | HR 70 | Temp 97.1°F | Resp 18 | Ht 65.0 in | Wt 194.2 lb

## 2014-10-03 DIAGNOSIS — Z170 Estrogen receptor positive status [ER+]: Secondary | ICD-10-CM

## 2014-10-03 DIAGNOSIS — L658 Other specified nonscarring hair loss: Secondary | ICD-10-CM

## 2014-10-03 DIAGNOSIS — K219 Gastro-esophageal reflux disease without esophagitis: Secondary | ICD-10-CM

## 2014-10-03 DIAGNOSIS — C50411 Malignant neoplasm of upper-outer quadrant of right female breast: Secondary | ICD-10-CM

## 2014-10-03 DIAGNOSIS — R0789 Other chest pain: Secondary | ICD-10-CM

## 2014-10-03 DIAGNOSIS — N959 Unspecified menopausal and perimenopausal disorder: Secondary | ICD-10-CM

## 2014-10-03 DIAGNOSIS — R439 Unspecified disturbances of smell and taste: Secondary | ICD-10-CM

## 2014-10-03 DIAGNOSIS — Z5111 Encounter for antineoplastic chemotherapy: Secondary | ICD-10-CM

## 2014-10-03 DIAGNOSIS — D509 Iron deficiency anemia, unspecified: Secondary | ICD-10-CM

## 2014-10-03 LAB — CBC WITH DIFFERENTIAL/PLATELET
BASO%: 0.8 % (ref 0.0–2.0)
Basophils Absolute: 0.1 10*3/uL (ref 0.0–0.1)
EOS%: 0.9 % (ref 0.0–7.0)
Eosinophils Absolute: 0.1 10*3/uL (ref 0.0–0.5)
HCT: 31.9 % — ABNORMAL LOW (ref 34.8–46.6)
HGB: 10.1 g/dL — ABNORMAL LOW (ref 11.6–15.9)
LYMPH#: 1 10*3/uL (ref 0.9–3.3)
LYMPH%: 11.4 % — ABNORMAL LOW (ref 14.0–49.7)
MCH: 28.4 pg (ref 25.1–34.0)
MCHC: 31.5 g/dL (ref 31.5–36.0)
MCV: 90.1 fL (ref 79.5–101.0)
MONO#: 0.8 10*3/uL (ref 0.1–0.9)
MONO%: 8.5 % (ref 0.0–14.0)
NEUT#: 7 10*3/uL — ABNORMAL HIGH (ref 1.5–6.5)
NEUT%: 78.4 % — AB (ref 38.4–76.8)
Platelets: 336 10*3/uL (ref 145–400)
RBC: 3.55 10*6/uL — ABNORMAL LOW (ref 3.70–5.45)
RDW: 25 % — ABNORMAL HIGH (ref 11.2–14.5)
WBC: 9 10*3/uL (ref 3.9–10.3)

## 2014-10-03 LAB — COMPREHENSIVE METABOLIC PANEL (CC13)
ALK PHOS: 103 U/L (ref 40–150)
ALT: 15 U/L (ref 0–55)
AST: 16 U/L (ref 5–34)
Albumin: 3.8 g/dL (ref 3.5–5.0)
Anion Gap: 8 mEq/L (ref 3–11)
BILIRUBIN TOTAL: 0.21 mg/dL (ref 0.20–1.20)
BUN: 7.2 mg/dL (ref 7.0–26.0)
CHLORIDE: 108 meq/L (ref 98–109)
CO2: 26 meq/L (ref 22–29)
CREATININE: 0.8 mg/dL (ref 0.6–1.1)
Calcium: 9 mg/dL (ref 8.4–10.4)
EGFR: >90 mL/min/{1.73_m2} (ref >90)
GLUCOSE: 93 mg/dL (ref 70–140)
Potassium: 4.2 mEq/L (ref 3.5–5.1)
SODIUM: 142 meq/L (ref 136–145)
Total Protein: 6.4 g/dL (ref 6.4–8.3)

## 2014-10-03 MED ORDER — FAMOTIDINE IN NACL 20-0.9 MG/50ML-% IV SOLN
20.0000 mg | Freq: Once | INTRAVENOUS | Status: AC
  Administered 2014-10-03: 20 mg via INTRAVENOUS

## 2014-10-03 MED ORDER — VENLAFAXINE HCL ER 37.5 MG PO CP24: 37.5000 mg | ORAL_CAPSULE | Freq: Every day | ORAL | Status: DC

## 2014-10-03 MED ORDER — ONDANSETRON 8 MG/50ML IVPB (CHCC)
8.0000 mg | Freq: Once | INTRAVENOUS | Status: AC
  Administered 2014-10-03: 8 mg via INTRAVENOUS

## 2014-10-03 MED ORDER — SODIUM CHLORIDE 0.9 % IV SOLN
Freq: Once | INTRAVENOUS | Status: AC
  Administered 2014-10-03: 12:00:00 via INTRAVENOUS

## 2014-10-03 MED ORDER — ONDANSETRON 8 MG/NS 50 ML IVPB
INTRAVENOUS | Status: AC
  Filled 2014-10-03: qty 8

## 2014-10-03 MED ORDER — DIPHENHYDRAMINE HCL 50 MG/ML IJ SOLN
50.0000 mg | Freq: Once | INTRAMUSCULAR | Status: AC
  Administered 2014-10-03: 50 mg via INTRAVENOUS

## 2014-10-03 MED ORDER — PACLITAXEL CHEMO INJECTION 300 MG/50ML
80.0000 mg/m2 | Freq: Once | INTRAVENOUS | Status: AC
  Administered 2014-10-03: 162 mg via INTRAVENOUS
  Filled 2014-10-03: qty 27

## 2014-10-03 MED ORDER — FAMOTIDINE IN NACL 20-0.9 MG/50ML-% IV SOLN
INTRAVENOUS | Status: AC
  Filled 2014-10-03: qty 50

## 2014-10-03 MED ORDER — HEPARIN SOD (PORK) LOCK FLUSH 100 UNIT/ML IV SOLN
500.0000 [IU] | Freq: Once | INTRAVENOUS | Status: AC | PRN
  Administered 2014-10-03: 500 [IU]
  Filled 2014-10-03: qty 5

## 2014-10-03 MED ORDER — DIPHENHYDRAMINE HCL 50 MG/ML IJ SOLN
INTRAMUSCULAR | Status: AC
  Filled 2014-10-03: qty 1

## 2014-10-03 MED ORDER — SODIUM CHLORIDE 0.9 % IJ SOLN
10.0000 mL | INTRAMUSCULAR | Status: DC | PRN
  Administered 2014-10-03 (×2): 10 mL
  Filled 2014-10-03: qty 10

## 2014-10-03 MED ORDER — LORAZEPAM 0.5 MG PO TABS: 0.5000 mg | ORAL_TABLET | Freq: Four times a day (QID) | ORAL | Status: DC | PRN

## 2014-10-03 NOTE — Assessment & Plan Note (Addendum)
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  Toxicities to chemotherapy 1. Acid reflux: Patient is taking Zantac and we decreased the dosage of Decadron. Patient is trying not to sleep after she eats food at dinner. 2. Chest pressure: Probably related to #1: Resolved with Zantac 3. Alopecia 4. Loss of taste 5. Hot flashes at night: Stop gabapentin and initiate Effexor XR I renewed her Ativan prescription today  Monitoring her closely for toxicities to chemotherapy Her blood counts were reviewed and she has adequate for her chemotherapy.  Iron deficiency anemia:Off oral iron.She is also receiving Zoladex injections to stop her menstrual cycles which have been very heavy previously. Iron studies revealed 27% saturation with a ferritin of 115. Hemoglobin electrophoresis did not reveal any hemoglobinopathies. hemoglobin today is 10.1  Return to clinic in 2 weeks and weekly for Taxol treatments

## 2014-10-03 NOTE — Progress Notes (Signed)
Patient Care Team: No Pcp Per Patient as PCP - General (General Practice) Rolm Bookbinder, MD as Consulting Physician (General Surgery) Rulon Eisenmenger, MD as Consulting Physician (Hematology and Oncology) Thea Silversmith, MD as Consulting Physician (Radiation Oncology) Trinda Pascal, NP as Nurse Practitioner (Nurse Practitioner) Carola Frost, RN as Registered Nurse (Oncology)  DIAGNOSIS: Breast cancer of upper-outer quadrant of right female breast   Staging form: Breast, AJCC 7th Edition     Clinical stage from 07/26/2014: Stage IIB (T2, N1, M0) - Unsigned       Staging comments: Staged at breast conference on 12.16.15    SUMMARY OF ONCOLOGIC HISTORY:   Breast cancer of upper-outer quadrant of right female breast   07/13/2014 Mammogram indeterminate mass in the right breast by ultrasound measured 2 cm at 10:00 position   07/20/2014 Initial Diagnosis right breast biopsy: Invasive ductal carcinoma, 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 She finished 4 cycles of 80 minutes and Cytoxan today she says Taxol. She reports that she is not able to sleep too well. She also has performed hot flashes. She tried gabapentin which hasn't worked for her.  REVIEW OF SYSTEMS:   Constitutional: Denies fevers, chills or abnormal weight loss Eyes: Denies blurriness of  vision Ears, nose, mouth, throat, and face: Denies mucositis or sore throat Respiratory: Denies cough, dyspnea or wheezes Cardiovascular: Denies palpitation, chest discomfort or lower extremity swelling Gastrointestinal:  Denies nausea, heartburn or change in bowel habits Skin: Denies abnormal skin rashes Lymphatics: Denies new lymphadenopathy or easy bruising Neurological:Denies numbness, tingling or new weaknesses Behavioral/Psych: Mood is stable, no new changes  Breast:  denies any pain or lumps or nodules in either breasts All other systems were reviewed with the patient and are negative.  I have reviewed the past medical history, past surgical history, social history and family history with the patient and they are unchanged from previous note.  ALLERGIES:  is allergic to tape.  MEDICATIONS:  Current Outpatient Prescriptions  Medication Sig Dispense Refill  . Atorvastatin Calcium (INVESTIGATIONAL ATORVASTATIN/PLACEBO) 40 MG tablet [facility] 250[address] Take 1 tablet by mouth daily. Take 2 doses (these doses must be 12 hours apart) prior to first chemotherapy treatment. Then take 1 tablet daily by mouth with or without food. 180 tablet 0  . dexamethasone (DECADRON) 4 MG tablet   1  . lidocaine-prilocaine (EMLA) cream   3  . LORazepam (ATIVAN) 0.5 MG tablet Take 1 tablet (0.5 mg total) by mouth every 6 (six) hours as needed (Nausea or vomiting). 30 tablet 3  . ranitidine (ZANTAC) 300 MG tablet Take 1 tablet (300 mg total) by mouth at bedtime. 30 tablet 0  . ferrous fumarate (HEMOCYTE - 106 MG FE) 325 (106 FE) MG TABS tablet Take 1 tablet by mouth daily.    Marland Kitchen venlafaxine XR (EFFEXOR-XR) 37.5 MG 24 hr capsule Take 1 capsule (37.5 mg total) by mouth daily with breakfast. 30  capsule 3   No current facility-administered medications for this visit.    PHYSICAL EXAMINATION: ECOG PERFORMANCE STATUS: 1 - Symptomatic but completely ambulatory  Filed Vitals:   10/03/14 1110  BP: 160/99   Pulse: 70  Temp: 97.1 F (36.2 C)  Resp: 18   Filed Weights   10/03/14 1110  Weight: 194 lb 3.2 oz (88.089 kg)    GENERAL:alert, no distress and comfortable SKIN: skin color, texture, turgor are normal, no rashes or significant lesions EYES: normal, Conjunctiva are pink and non-injected, sclera clear OROPHARYNX:no exudate, no erythema and lips, buccal mucosa, and tongue normal  NECK: supple, thyroid normal size, non-tender, without nodularity LYMPH:  no palpable lymphadenopathy in the cervical, axillary or inguinal LUNGS: clear to auscultation and percussion with normal breathing effort HEART: regular rate & rhythm and no murmurs and no lower extremity edema ABDOMEN:abdomen soft, non-tender and normal bowel sounds Musculoskeletal:no cyanosis of digits and no clubbing  NEURO: alert & oriented x 3 with fluent speech, no focal motor/sensory deficits  LABORATORY DATA:  I have reviewed the data as listed   Chemistry      Component Value Date/Time   NA 142 10/03/2014 1049   NA 138 01/22/2011 2330   K 4.2 10/03/2014 1049   K 3.6 01/22/2011 2330   CL 104 01/22/2011 2330   CO2 26 10/03/2014 1049   CO2 28 01/22/2011 2330   BUN 7.2 10/03/2014 1049   BUN 9 01/22/2011 2330   CREATININE 0.8 10/03/2014 1049   CREATININE 0.72 01/22/2011 2330      Component Value Date/Time   CALCIUM 9.0 10/03/2014 1049   CALCIUM 9.2 01/22/2011 2330   ALKPHOS 103 10/03/2014 1049   ALKPHOS 55 01/22/2011 2330   AST 16 10/03/2014 1049   AST 21 01/22/2011 2330   ALT 15 10/03/2014 1049   ALT 15 01/22/2011 2330   BILITOT 0.21 10/03/2014 1049   BILITOT 0.2* 01/22/2011 2330       Lab Results  Component Value Date   WBC 9.0 10/03/2014   HGB 10.1* 10/03/2014   HCT 31.9* 10/03/2014   MCV 90.1 10/03/2014   PLT 336 10/03/2014   NEUTROABS 7.0* 10/03/2014     ASSESSMENT & PLAN:  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  Toxicities to chemotherapy 1. Acid reflux: Patient is taking Zantac and we decreased the dosage of Decadron. Patient is trying not to sleep after she eats food at dinner. 2. Chest pressure: Probably related to #1: Resolved with Zantac 3. Alopecia 4. Loss of taste 5. Hot flashes at night: Stop gabapentin and initiate Effexor XR I renewed her Ativan prescription today  Monitoring her closely for toxicities to chemotherapy Her blood counts were reviewed and she has adequate for her chemotherapy.  Iron deficiency anemia:Off oral iron.She is also receiving Zoladex injections to stop her menstrual cycles which have been very heavy previously. Iron studies revealed 27% saturation with a ferritin of 115. Hemoglobin electrophoresis did not reveal any hemoglobinopathies. hemoglobin today is 10.1  Return to clinic in 2 weeks and weekly for Taxol treatments     No orders of  the defined types were placed in this encounter.   The patient has a good understanding of the overall plan. she agrees with it. She will call with any problems that may develop before her next visit here.   Rulon Eisenmenger, MD

## 2014-10-03 NOTE — Telephone Encounter (Signed)
, °

## 2014-10-03 NOTE — Patient Instructions (Signed)
[facility] Discharge Instructions for Patients Receiving Chemotherapy  Today you received the following chemotherapy agents Taxol  To help prevent nausea and vomiting after your treatment, we encourage you to take your nausea medication Zofran as directed   If you develop nausea and vomiting that is not controlled by your nausea medication, call the clinic.   BELOW ARE SYMPTOMS THAT SHOULD BE REPORTED IMMEDIATELY:  *FEVER GREATER THAN 100.5 F  *CHILLS WITH OR WITHOUT FEVER  NAUSEA AND VOMITING THAT IS NOT CONTROLLED WITH YOUR NAUSEA MEDICATION  *UNUSUAL SHORTNESS OF BREATH  *UNUSUAL BRUISING OR BLEEDING  TENDERNESS IN MOUTH AND THROAT WITH OR WITHOUT PRESENCE OF ULCERS  *URINARY PROBLEMS  *BOWEL PROBLEMS  UNUSUAL RASH Items with * indicate a potential emergency and should be followed up as soon as possible.  Feel free to call the clinic you have any questions or concerns. The clinic phone number is [phone].

## 2014-10-04 ENCOUNTER — Telehealth: Payer: Self-pay | Admitting: *Deleted

## 2014-10-04 NOTE — Telephone Encounter (Signed)
Patient had first Taxol treatment yesterday.  She reports she is doing well with no complaints.  She is eating and drinking well with no nausea or vomiting.  Is voiding well and denies diarrhea or constipation. Invited her to call with questions or concerns.

## 2014-10-06 ENCOUNTER — Other Ambulatory Visit: Payer: Self-pay | Admitting: *Deleted

## 2014-10-06 DIAGNOSIS — C50411 Malignant neoplasm of upper-outer quadrant of right female breast: Secondary | ICD-10-CM

## 2014-10-10 ENCOUNTER — Other Ambulatory Visit (HOSPITAL_BASED_OUTPATIENT_CLINIC_OR_DEPARTMENT_OTHER): Payer: Medicaid Other

## 2014-10-10 ENCOUNTER — Ambulatory Visit (HOSPITAL_BASED_OUTPATIENT_CLINIC_OR_DEPARTMENT_OTHER): Payer: Medicaid Other

## 2014-10-10 DIAGNOSIS — C50411 Malignant neoplasm of upper-outer quadrant of right female breast: Secondary | ICD-10-CM

## 2014-10-10 DIAGNOSIS — Z5111 Encounter for antineoplastic chemotherapy: Secondary | ICD-10-CM

## 2014-10-10 DIAGNOSIS — C773 Secondary and unspecified malignant neoplasm of axilla and upper limb lymph nodes: Secondary | ICD-10-CM

## 2014-10-10 LAB — CBC WITH DIFFERENTIAL/PLATELET
BASO%: 2.6 % — AB (ref 0.0–2.0)
Basophils Absolute: 0.2 10*3/uL — ABNORMAL HIGH (ref 0.0–0.1)
EOS%: 1.7 % (ref 0.0–7.0)
Eosinophils Absolute: 0.1 10*3/uL (ref 0.0–0.5)
HEMATOCRIT: 30.3 % — AB (ref 34.8–46.6)
HGB: 9.6 g/dL — ABNORMAL LOW (ref 11.6–15.9)
LYMPH%: 12.1 % — AB (ref 14.0–49.7)
MCH: 28.6 pg (ref 25.1–34.0)
MCHC: 31.8 g/dL (ref 31.5–36.0)
MCV: 89.9 fL (ref 79.5–101.0)
MONO#: 0.5 10*3/uL (ref 0.1–0.9)
MONO%: 8.1 % (ref 0.0–14.0)
NEUT#: 4.5 10*3/uL (ref 1.5–6.5)
NEUT%: 75.5 % (ref 38.4–76.8)
PLATELETS: 405 10*3/uL — AB (ref 145–400)
RBC: 3.37 10*6/uL — AB (ref 3.70–5.45)
RDW: 22.8 % — ABNORMAL HIGH (ref 11.2–14.5)
WBC: 5.9 10*3/uL (ref 3.9–10.3)
lymph#: 0.7 10*3/uL — ABNORMAL LOW (ref 0.9–3.3)

## 2014-10-10 LAB — COMPREHENSIVE METABOLIC PANEL (CC13)
ALBUMIN: 3.7 g/dL (ref 3.5–5.0)
ALK PHOS: 67 U/L (ref 40–150)
ALT: 20 U/L (ref 0–55)
AST: 18 U/L (ref 5–34)
Anion Gap: 12 mEq/L — ABNORMAL HIGH (ref 3–11)
BILIRUBIN TOTAL: 0.27 mg/dL (ref 0.20–1.20)
BUN: 10.4 mg/dL (ref 7.0–26.0)
CHLORIDE: 108 meq/L (ref 98–109)
CO2: 25 mEq/L (ref 22–29)
CREATININE: 0.8 mg/dL (ref 0.6–1.1)
Calcium: 9.2 mg/dL (ref 8.4–10.4)
EGFR: >90 mL/min/{1.73_m2} (ref >90)
Glucose: 93 mg/dl (ref 70–140)
Potassium: 4 mEq/L (ref 3.5–5.1)
Sodium: 145 mEq/L (ref 136–145)
Total Protein: 6.4 g/dL (ref 6.4–8.3)

## 2014-10-10 MED ORDER — SODIUM CHLORIDE 0.9 % IV SOLN
Freq: Once | INTRAVENOUS | Status: AC
  Administered 2014-10-10: 10:00:00 via INTRAVENOUS

## 2014-10-10 MED ORDER — FAMOTIDINE IN NACL 20-0.9 MG/50ML-% IV SOLN
20.0000 mg | Freq: Once | INTRAVENOUS | Status: AC
  Administered 2014-10-10: 20 mg via INTRAVENOUS

## 2014-10-10 MED ORDER — ONDANSETRON 8 MG/NS 50 ML IVPB
INTRAVENOUS | Status: AC
  Filled 2014-10-10: qty 8

## 2014-10-10 MED ORDER — ONDANSETRON 8 MG/50ML IVPB (CHCC)
8.0000 mg | Freq: Once | INTRAVENOUS | Status: AC
  Administered 2014-10-10: 8 mg via INTRAVENOUS

## 2014-10-10 MED ORDER — DIPHENHYDRAMINE HCL 50 MG/ML IJ SOLN
INTRAMUSCULAR | Status: AC
  Filled 2014-10-10: qty 1

## 2014-10-10 MED ORDER — PACLITAXEL CHEMO INJECTION 300 MG/50ML
80.0000 mg/m2 | Freq: Once | INTRAVENOUS | Status: AC
  Administered 2014-10-10: 162 mg via INTRAVENOUS
  Filled 2014-10-10: qty 27

## 2014-10-10 MED ORDER — DIPHENHYDRAMINE HCL 50 MG/ML IJ SOLN
50.0000 mg | Freq: Once | INTRAMUSCULAR | Status: AC
  Administered 2014-10-10: 50 mg via INTRAVENOUS

## 2014-10-10 MED ORDER — FAMOTIDINE IN NACL 20-0.9 MG/50ML-% IV SOLN
INTRAVENOUS | Status: AC
  Filled 2014-10-10: qty 50

## 2014-10-10 MED ORDER — HEPARIN SOD (PORK) LOCK FLUSH 100 UNIT/ML IV SOLN
500.0000 [IU] | Freq: Once | INTRAVENOUS | Status: AC | PRN
  Administered 2014-10-10: 500 [IU]
  Filled 2014-10-10: qty 5

## 2014-10-10 MED ORDER — SODIUM CHLORIDE 0.9 % IJ SOLN
10.0000 mL | INTRAMUSCULAR | Status: DC | PRN
  Administered 2014-10-10: 10 mL
  Filled 2014-10-10: qty 10

## 2014-10-10 NOTE — Patient Instructions (Signed)
[facility] Discharge Instructions for Patients Receiving Chemotherapy  Today you received the following chemotherapy agents: Taxol.  To help prevent nausea and vomiting after your treatment, we encourage you to take your nausea medication as prescribed.   If you develop nausea and vomiting that is not controlled by your nausea medication, call the clinic.   BELOW ARE SYMPTOMS THAT SHOULD BE REPORTED IMMEDIATELY:  *FEVER GREATER THAN 100.5 F  *CHILLS WITH OR WITHOUT FEVER  NAUSEA AND VOMITING THAT IS NOT CONTROLLED WITH YOUR NAUSEA MEDICATION  *UNUSUAL SHORTNESS OF BREATH  *UNUSUAL BRUISING OR BLEEDING  TENDERNESS IN MOUTH AND THROAT WITH OR WITHOUT PRESENCE OF ULCERS  *URINARY PROBLEMS  *BOWEL PROBLEMS  UNUSUAL RASH Items with * indicate a potential emergency and should be followed up as soon as possible.  Feel free to call the clinic you have any questions or concerns. The clinic phone number is [phone].    

## 2014-10-13 ENCOUNTER — Encounter: Payer: Self-pay | Admitting: Hematology and Oncology

## 2014-10-16 ENCOUNTER — Encounter: Payer: Self-pay | Admitting: Hematology and Oncology

## 2014-10-16 ENCOUNTER — Other Ambulatory Visit: Payer: Self-pay

## 2014-10-16 DIAGNOSIS — C50411 Malignant neoplasm of upper-outer quadrant of right female breast: Secondary | ICD-10-CM

## 2014-10-16 NOTE — Progress Notes (Signed)
Left VM for pt. reminding her to bring her medication calendars tomorrow. Barb Christean Silvestri 10/16/2014.0957

## 2014-10-17 ENCOUNTER — Telehealth: Payer: Self-pay | Admitting: *Deleted

## 2014-10-17 ENCOUNTER — Ambulatory Visit (HOSPITAL_BASED_OUTPATIENT_CLINIC_OR_DEPARTMENT_OTHER): Payer: Medicaid Other

## 2014-10-17 ENCOUNTER — Telehealth: Payer: Self-pay | Admitting: Hematology and Oncology

## 2014-10-17 ENCOUNTER — Ambulatory Visit (HOSPITAL_BASED_OUTPATIENT_CLINIC_OR_DEPARTMENT_OTHER): Payer: Medicaid Other | Admitting: Hematology and Oncology

## 2014-10-17 ENCOUNTER — Other Ambulatory Visit: Payer: No Typology Code available for payment source

## 2014-10-17 ENCOUNTER — Other Ambulatory Visit: Payer: Self-pay

## 2014-10-17 VITALS — BP 147/94 | HR 68 | Temp 98.5°F | Resp 18 | Ht 65.0 in | Wt 194.5 lb

## 2014-10-17 DIAGNOSIS — D509 Iron deficiency anemia, unspecified: Secondary | ICD-10-CM

## 2014-10-17 DIAGNOSIS — N959 Unspecified menopausal and perimenopausal disorder: Secondary | ICD-10-CM

## 2014-10-17 DIAGNOSIS — R439 Unspecified disturbances of smell and taste: Secondary | ICD-10-CM

## 2014-10-17 DIAGNOSIS — C773 Secondary and unspecified malignant neoplasm of axilla and upper limb lymph nodes: Secondary | ICD-10-CM

## 2014-10-17 DIAGNOSIS — K219 Gastro-esophageal reflux disease without esophagitis: Secondary | ICD-10-CM

## 2014-10-17 DIAGNOSIS — C50411 Malignant neoplasm of upper-outer quadrant of right female breast: Secondary | ICD-10-CM

## 2014-10-17 DIAGNOSIS — Z170 Estrogen receptor positive status [ER+]: Secondary | ICD-10-CM

## 2014-10-17 DIAGNOSIS — Z5111 Encounter for antineoplastic chemotherapy: Secondary | ICD-10-CM

## 2014-10-17 LAB — COMPREHENSIVE METABOLIC PANEL (CC13)
ALT: 23 U/L (ref 0–55)
AST: 18 U/L (ref 5–34)
Albumin: 3.6 g/dL (ref 3.5–5.0)
Alkaline Phosphatase: 68 U/L (ref 40–150)
Anion Gap: 9 meq/L (ref 3–11)
BUN: 8.9 mg/dL (ref 7.0–26.0)
CO2: 25 meq/L (ref 22–29)
Calcium: 8.8 mg/dL (ref 8.4–10.4)
Chloride: 109 meq/L (ref 98–109)
Creatinine: 0.8 mg/dL (ref 0.6–1.1)
EGFR: >90 ml/min/1.73 m2
Glucose: 98 mg/dL (ref 70–140)
Potassium: 3.7 meq/L (ref 3.5–5.1)
Sodium: 143 meq/L (ref 136–145)
Total Bilirubin: <0.2 mg/dL (ref 0.20–1.20)
Total Protein: 6.1 g/dL — ABNORMAL LOW (ref 6.4–8.3)

## 2014-10-17 LAB — CBC WITH DIFFERENTIAL/PLATELET
BASO%: 3.1 % — ABNORMAL HIGH (ref 0.0–2.0)
Basophils Absolute: 0.1 10e3/uL (ref 0.0–0.1)
EOS%: 3.8 % (ref 0.0–7.0)
Eosinophils Absolute: 0.2 10e3/uL (ref 0.0–0.5)
HCT: 29.7 % — ABNORMAL LOW (ref 34.8–46.6)
HGB: 9.4 g/dL — ABNORMAL LOW (ref 11.6–15.9)
LYMPH%: 21.7 % (ref 14.0–49.7)
MCH: 28.6 pg (ref 25.1–34.0)
MCHC: 31.6 g/dL (ref 31.5–36.0)
MCV: 90.3 fL (ref 79.5–101.0)
MONO#: 0.5 10e3/uL (ref 0.1–0.9)
MONO%: 11.1 % (ref 0.0–14.0)
NEUT#: 2.6 10e3/uL (ref 1.5–6.5)
NEUT%: 60.3 % (ref 38.4–76.8)
Platelets: 468 10e3/uL — ABNORMAL HIGH (ref 145–400)
RBC: 3.29 10e6/uL — ABNORMAL LOW (ref 3.70–5.45)
RDW: 20.3 % — ABNORMAL HIGH (ref 11.2–14.5)
WBC: 4.2 10e3/uL (ref 3.9–10.3)
lymph#: 0.9 10e3/uL (ref 0.9–3.3)

## 2014-10-17 MED ORDER — DIPHENHYDRAMINE HCL 50 MG/ML IJ SOLN
INTRAMUSCULAR | Status: AC
  Filled 2014-10-17: qty 1

## 2014-10-17 MED ORDER — DIPHENHYDRAMINE HCL 50 MG/ML IJ SOLN
50.0000 mg | Freq: Once | INTRAMUSCULAR | Status: AC
  Administered 2014-10-17: 50 mg via INTRAVENOUS

## 2014-10-17 MED ORDER — FAMOTIDINE IN NACL 20-0.9 MG/50ML-% IV SOLN
INTRAVENOUS | Status: AC
Start: 2014-10-17 — End: 2014-10-17
  Filled 2014-10-17: qty 50

## 2014-10-17 MED ORDER — PACLITAXEL CHEMO INJECTION 300 MG/50ML
80.0000 mg/m2 | Freq: Once | INTRAVENOUS | Status: AC
  Administered 2014-10-17: 162 mg via INTRAVENOUS
  Filled 2014-10-17: qty 27

## 2014-10-17 MED ORDER — SODIUM CHLORIDE 0.9 % IV SOLN
Freq: Once | INTRAVENOUS | Status: AC
  Administered 2014-10-17: 10:00:00 via INTRAVENOUS

## 2014-10-17 MED ORDER — SODIUM CHLORIDE 0.9 % IJ SOLN
10.0000 mL | INTRAMUSCULAR | Status: DC | PRN
  Administered 2014-10-17: 10 mL
  Filled 2014-10-17: qty 10

## 2014-10-17 MED ORDER — FAMOTIDINE IN NACL 20-0.9 MG/50ML-% IV SOLN
20.0000 mg | Freq: Once | INTRAVENOUS | Status: AC
  Administered 2014-10-17: 20 mg via INTRAVENOUS

## 2014-10-17 MED ORDER — ONDANSETRON 8 MG/50ML IVPB (CHCC)
8.0000 mg | Freq: Once | INTRAVENOUS | Status: AC
Start: 2014-10-17 — End: 2014-10-17
  Administered 2014-10-17: 8 mg via INTRAVENOUS
  Filled 2014-10-17: qty 8

## 2014-10-17 MED ORDER — HEPARIN SOD (PORK) LOCK FLUSH 100 UNIT/ML IV SOLN
500.0000 [IU] | Freq: Once | INTRAVENOUS | Status: AC | PRN
  Administered 2014-10-17: 500 [IU]
  Filled 2014-10-17: qty 5

## 2014-10-17 NOTE — Patient Instructions (Signed)
[facility] Discharge Instructions for Patients Receiving Chemotherapy  Today you received the following chemotherapy agents: Taxol.  To help prevent nausea and vomiting after your treatment, we encourage you to take your nausea medication as prescribed.   If you develop nausea and vomiting that is not controlled by your nausea medication, call the clinic.   BELOW ARE SYMPTOMS THAT SHOULD BE REPORTED IMMEDIATELY:  *FEVER GREATER THAN 100.5 F  *CHILLS WITH OR WITHOUT FEVER  NAUSEA AND VOMITING THAT IS NOT CONTROLLED WITH YOUR NAUSEA MEDICATION  *UNUSUAL SHORTNESS OF BREATH  *UNUSUAL BRUISING OR BLEEDING  TENDERNESS IN MOUTH AND THROAT WITH OR WITHOUT PRESENCE OF ULCERS  *URINARY PROBLEMS  *BOWEL PROBLEMS  UNUSUAL RASH Items with * indicate a potential emergency and should be followed up as soon as possible.  Feel free to call the clinic you have any questions or concerns. The clinic phone number is [phone].    

## 2014-10-17 NOTE — Telephone Encounter (Signed)
appts made and multi md appts added due to pt being seen q 2weeks ok per terri f whom will submit a new avs,email to michelle to alter 3.22

## 2014-10-17 NOTE — Assessment & Plan Note (Signed)
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  Toxicities to chemotherapy 1. Acid reflux: Patient is taking Zantac and we decreased the dosage of Decadron.  2. Chest pressure: Probably related to #1: Resolved with Zantac 3. Alopecia 4. Loss of taste 5. Hot flashes at night: Stopped gabapentin and initiated Effexor XR  Monitoring her closely for toxicities to chemotherapy Her blood counts were reviewed and she has adequate for her chemotherapy.  Iron deficiency anemia:Off oral iron.She is also receiving Zoladex injections to stop her menstrual cycles which have been very heavy previously. Iron studies revealed 27% saturation with a ferritin of 115. Hemoglobin electrophoresis did not reveal any hemoglobinopathies. hemoglobin today is   Return to clinic in 2 weeks and weekly for Taxol treatments

## 2014-10-17 NOTE — Progress Notes (Signed)
Patient Care Team: No Pcp Per Patient as PCP - General (General Practice) Rolm Bookbinder, MD as Consulting Physician (General Surgery) Nicholas Lose, MD as Consulting Physician (Hematology and Oncology) Thea Silversmith, MD as Consulting Physician (Radiation Oncology) Holley Bouche, NP as Nurse Practitioner (Nurse Practitioner) Carola Frost, RN as Registered Nurse (Oncology)  DIAGNOSIS: Breast cancer of upper-outer quadrant of right female breast   Staging form: Breast, AJCC 7th Edition     Clinical stage from 07/26/2014: Stage IIB (T2, N1, M0) - Unsigned       Staging comments: Staged at breast conference on 12.16.15    SUMMARY OF ONCOLOGIC HISTORY:   Breast cancer of upper-outer quadrant of right female breast   07/13/2014 Mammogram indeterminate mass in the right breast by ultrasound measured 2 cm at 10:00 position   07/20/2014 Initial Diagnosis right breast biopsy: Invasive ductal carcinoma, 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 She has tolerated Adriamycin and Cytoxan and is now on Taxol. She has noticed that for 2 days after chemotherapy, her fingers appeared to be sore to the point that she does not want to write anything. And then they recovered fully without any residual signs or symptoms of neuropathy. She noticed that the nails are growing again, some coming  back, energy levels are slightly better.  REVIEW OF SYSTEMS:   Constitutional: Denies fevers, chills or abnormal weight loss Eyes: Denies blurriness of vision Ears, nose, mouth, throat, and face: Denies mucositis or sore throat Respiratory: Denies cough, dyspnea or wheezes Cardiovascular: Denies palpitation, chest discomfort or lower extremity swelling Gastrointestinal:  Denies nausea, heartburn or change in bowel habits Skin: Denies abnormal skin rashes Lymphatics: Denies new lymphadenopathy or easy bruising Neurological:Denies numbness, tingling or new weaknesses Behavioral/Psych: Mood is stable, no new changes  All other systems were reviewed with the patient and are negative.  I have reviewed the past medical history, past surgical history, social history and family history with the patient and they are unchanged from previous note.  ALLERGIES:  is allergic to tape.  MEDICATIONS:  Current Outpatient Prescriptions  Medication Sig Dispense Refill  . Atorvastatin Calcium (INVESTIGATIONAL ATORVASTATIN/PLACEBO) 40 MG tablet [facility] [id number] Take 1 tablet by mouth daily. Take 2 doses (these doses must be 12 hours apart) prior to first chemotherapy treatment. Then take 1 tablet daily by mouth with or without food. 180 tablet 0  . dexamethasone (DECADRON) 4 MG tablet   1  . ferrous fumarate (HEMOCYTE - 106 MG FE) 325 (106 FE) MG TABS tablet Take 1 tablet by mouth daily.    Marland Kitchen lidocaine-prilocaine (EMLA) cream   3  . LORazepam (ATIVAN) 0.5 MG tablet Take 1 tablet (0.5 mg total) by mouth every 6 (six) hours as needed (Nausea or vomiting). 30 tablet 3  . ranitidine (ZANTAC) 300 MG tablet Take 1 tablet (300 mg total) by mouth at bedtime. 30 tablet 0  . venlafaxine XR (EFFEXOR-XR) 37.5 MG  24 hr capsule Take 1 capsule (37.5 mg total) by mouth daily with breakfast. 30 capsule 3   No current facility-administered medications for this visit.    PHYSICAL EXAMINATION: ECOG PERFORMANCE STATUS: 1  - Symptomatic but completely ambulatory  Filed Vitals:   10/17/14 0837  BP: 147/94  Pulse: 68  Temp: 98.5 F (36.9 C)  Resp: 18   Filed Weights   10/17/14 0837  Weight: 194 lb 8 oz (88.225 kg)    GENERAL:alert, no distress and comfortable SKIN: skin color, texture, turgor are normal, no rashes or significant lesions EYES: normal, Conjunctiva are pink and non-injected, sclera clear OROPHARYNX:no exudate, no erythema and lips, buccal mucosa, and tongue normal  NECK: supple, thyroid normal size, non-tender, without nodularity LYMPH:  no palpable lymphadenopathy in the cervical, axillary or inguinal LUNGS: clear to auscultation and percussion with normal breathing effort HEART: regular rate & rhythm and no murmurs and no lower extremity edema ABDOMEN:abdomen soft, non-tender and normal bowel sounds Musculoskeletal:no cyanosis of digits and no clubbing  NEURO: alert & oriented x 3 with fluent speech, no focal motor/sensory deficits  LABORATORY DATA:  I have reviewed the data as listed   Chemistry      Component Value Date/Time   NA 143 10/17/2014 0818   NA 138 01/22/2011 2330   K 3.7 10/17/2014 0818   K 3.6 01/22/2011 2330   CL 104 01/22/2011 2330   CO2 25 10/17/2014 0818   CO2 28 01/22/2011 2330   BUN 8.9 10/17/2014 0818   BUN 9 01/22/2011 2330   CREATININE 0.8 10/17/2014 0818   CREATININE 0.72 01/22/2011 2330      Component Value Date/Time   CALCIUM 8.8 10/17/2014 0818   CALCIUM 9.2 01/22/2011 2330   ALKPHOS 68 10/17/2014 0818   ALKPHOS 55 01/22/2011 2330   AST 18 10/17/2014 0818   AST 21 01/22/2011 2330   ALT 23 10/17/2014 0818   ALT 15 01/22/2011 2330   BILITOT <0.20 10/17/2014 0818   BILITOT 0.2* 01/22/2011 2330       Lab Results  Component Value Date   WBC 4.2 10/17/2014   HGB 9.4* 10/17/2014   HCT 29.7* 10/17/2014   MCV 90.3 10/17/2014   PLT 468* 10/17/2014   NEUTROABS 2.6 10/17/2014     RADIOGRAPHIC STUDIES: I have personally reviewed the  radiology reports and agreed with their findings. No results found.   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  Toxicities to chemotherapy 1. Acid reflux: Patient is taking Zantac and we decreased the dosage of Decadron.  2. Chest pressure: Probably related to #1: Resolved with Zantac 3. Alopecia 4. Loss of taste 5. Hot flashes at night: Stopped gabapentin and initiated Effexor XR 6. Chemo induced anemia grade 2 hemoglobin stable at 9.4.  Monitoring her closely for toxicities to chemotherapy. 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 Her blood counts were reviewed and she has adequate for her chemotherapy.  Iron deficiency anemia:Off oral iron.She is also receiving Zoladex injections to stop her menstrual cycles which have been very heavy previously. Iron  studies revealed 27% saturation with a ferritin of 115. Hemoglobin electrophoresis did not reveal any hemoglobinopathies. hemoglobin today is 9.4 gm  Return to clinic in 2 weeks and weekly for Taxol treatments  No orders of the defined types were placed in this encounter.   The patient has a good understanding of the overall plan. she agrees with it. She will call with any problems that may develop before her next visit here.   Rulon Eisenmenger, MD

## 2014-10-17 NOTE — Telephone Encounter (Signed)
I have adjsuted 3/22 appt

## 2014-10-23 ENCOUNTER — Other Ambulatory Visit: Payer: Self-pay | Admitting: *Deleted

## 2014-10-23 DIAGNOSIS — C50411 Malignant neoplasm of upper-outer quadrant of right female breast: Secondary | ICD-10-CM

## 2014-10-24 ENCOUNTER — Other Ambulatory Visit: Payer: Self-pay | Admitting: Emergency Medicine

## 2014-10-24 ENCOUNTER — Ambulatory Visit (HOSPITAL_BASED_OUTPATIENT_CLINIC_OR_DEPARTMENT_OTHER): Payer: Medicaid Other

## 2014-10-24 ENCOUNTER — Other Ambulatory Visit (HOSPITAL_BASED_OUTPATIENT_CLINIC_OR_DEPARTMENT_OTHER): Payer: Medicaid Other

## 2014-10-24 DIAGNOSIS — C50411 Malignant neoplasm of upper-outer quadrant of right female breast: Secondary | ICD-10-CM

## 2014-10-24 DIAGNOSIS — Z5111 Encounter for antineoplastic chemotherapy: Secondary | ICD-10-CM

## 2014-10-24 LAB — COMPREHENSIVE METABOLIC PANEL (CC13)
ALK PHOS: 65 U/L (ref 40–150)
ALT: 35 U/L (ref 0–55)
AST: 23 U/L (ref 5–34)
Albumin: 3.6 g/dL (ref 3.5–5.0)
Anion Gap: 9 mEq/L (ref 3–11)
BILIRUBIN TOTAL: 0.33 mg/dL (ref 0.20–1.20)
BUN: 6.6 mg/dL — AB (ref 7.0–26.0)
CO2: 26 mEq/L (ref 22–29)
Calcium: 9.1 mg/dL (ref 8.4–10.4)
Chloride: 109 mEq/L (ref 98–109)
Creatinine: 0.7 mg/dL (ref 0.6–1.1)
GLUCOSE: 91 mg/dL (ref 70–140)
Potassium: 3.8 mEq/L (ref 3.5–5.1)
SODIUM: 144 meq/L (ref 136–145)
TOTAL PROTEIN: 6.3 g/dL — AB (ref 6.4–8.3)

## 2014-10-24 LAB — CBC WITH DIFFERENTIAL/PLATELET
BASO%: 1.4 % (ref 0.0–2.0)
BASOS ABS: 0.1 10*3/uL (ref 0.0–0.1)
EOS%: 3.1 % (ref 0.0–7.0)
Eosinophils Absolute: 0.2 10*3/uL (ref 0.0–0.5)
HCT: 30.6 % — ABNORMAL LOW (ref 34.8–46.6)
HEMOGLOBIN: 10.1 g/dL — AB (ref 11.6–15.9)
LYMPH%: 18.9 % (ref 14.0–49.7)
MCH: 29.5 pg (ref 25.1–34.0)
MCHC: 33 g/dL (ref 31.5–36.0)
MCV: 89.5 fL (ref 79.5–101.0)
MONO#: 0.4 10*3/uL (ref 0.1–0.9)
MONO%: 8.7 % (ref 0.0–14.0)
NEUT#: 3.5 10*3/uL (ref 1.5–6.5)
NEUT%: 67.9 % (ref 38.4–76.8)
PLATELETS: 330 10*3/uL (ref 145–400)
RBC: 3.42 10*6/uL — AB (ref 3.70–5.45)
RDW: 19 % — AB (ref 11.2–14.5)
WBC: 5.1 10*3/uL (ref 3.9–10.3)
lymph#: 1 10*3/uL (ref 0.9–3.3)

## 2014-10-24 MED ORDER — FAMOTIDINE IN NACL 20-0.9 MG/50ML-% IV SOLN
20.0000 mg | Freq: Once | INTRAVENOUS | Status: AC
  Administered 2014-10-24: 20 mg via INTRAVENOUS

## 2014-10-24 MED ORDER — SODIUM CHLORIDE 0.9 % IJ SOLN
10.0000 mL | INTRAMUSCULAR | Status: DC | PRN
  Administered 2014-10-24: 10 mL
  Filled 2014-10-24: qty 10

## 2014-10-24 MED ORDER — SODIUM CHLORIDE 0.9 % IV SOLN
Freq: Once | INTRAVENOUS | Status: AC
  Administered 2014-10-24: 09:00:00 via INTRAVENOUS
  Filled 2014-10-24: qty 4

## 2014-10-24 MED ORDER — PACLITAXEL CHEMO INJECTION 300 MG/50ML
80.0000 mg/m2 | Freq: Once | INTRAVENOUS | Status: AC
  Administered 2014-10-24: 162 mg via INTRAVENOUS
  Filled 2014-10-24: qty 27

## 2014-10-24 MED ORDER — DIPHENHYDRAMINE HCL 50 MG/ML IJ SOLN
INTRAMUSCULAR | Status: AC
  Filled 2014-10-24: qty 1

## 2014-10-24 MED ORDER — HEPARIN SOD (PORK) LOCK FLUSH 100 UNIT/ML IV SOLN
500.0000 [IU] | Freq: Once | INTRAVENOUS | Status: AC | PRN
  Administered 2014-10-24: 500 [IU]
  Filled 2014-10-24: qty 5

## 2014-10-24 MED ORDER — DIPHENHYDRAMINE HCL 50 MG/ML IJ SOLN
50.0000 mg | Freq: Once | INTRAMUSCULAR | Status: AC
  Administered 2014-10-24: 50 mg via INTRAVENOUS

## 2014-10-24 MED ORDER — FAMOTIDINE IN NACL 20-0.9 MG/50ML-% IV SOLN
INTRAVENOUS | Status: AC
  Filled 2014-10-24: qty 50

## 2014-10-24 MED ORDER — SODIUM CHLORIDE 0.9 % IV SOLN
Freq: Once | INTRAVENOUS | Status: AC
Start: 2014-10-24 — End: 2014-10-24
  Administered 2014-10-24: 09:00:00 via INTRAVENOUS

## 2014-10-24 NOTE — Patient Instructions (Signed)
[facility] Discharge Instructions for Patients Receiving Chemotherapy  Today you received the following chemotherapy agents Taxol  To help prevent nausea and vomiting after your treatment, we encourage you to take your nausea medication    If you develop nausea and vomiting that is not controlled by your nausea medication, call the clinic.   BELOW ARE SYMPTOMS THAT SHOULD BE REPORTED IMMEDIATELY:  *FEVER GREATER THAN 100.5 F  *CHILLS WITH OR WITHOUT FEVER  NAUSEA AND VOMITING THAT IS NOT CONTROLLED WITH YOUR NAUSEA MEDICATION  *UNUSUAL SHORTNESS OF BREATH  *UNUSUAL BRUISING OR BLEEDING  TENDERNESS IN MOUTH AND THROAT WITH OR WITHOUT PRESENCE OF ULCERS  *URINARY PROBLEMS  *BOWEL PROBLEMS  UNUSUAL RASH Items with * indicate a potential emergency and should be followed up as soon as possible.  Feel free to call the clinic you have any questions or concerns. The clinic phone number is [phone].    

## 2014-10-30 ENCOUNTER — Other Ambulatory Visit: Payer: Self-pay

## 2014-10-30 DIAGNOSIS — C50411 Malignant neoplasm of upper-outer quadrant of right female breast: Secondary | ICD-10-CM

## 2014-10-31 ENCOUNTER — Telehealth: Payer: Self-pay | Admitting: Hematology and Oncology

## 2014-10-31 ENCOUNTER — Ambulatory Visit (HOSPITAL_BASED_OUTPATIENT_CLINIC_OR_DEPARTMENT_OTHER): Payer: Medicaid Other

## 2014-10-31 ENCOUNTER — Ambulatory Visit (HOSPITAL_BASED_OUTPATIENT_CLINIC_OR_DEPARTMENT_OTHER): Payer: Medicaid Other | Admitting: Hematology and Oncology

## 2014-10-31 ENCOUNTER — Other Ambulatory Visit (HOSPITAL_BASED_OUTPATIENT_CLINIC_OR_DEPARTMENT_OTHER): Payer: Medicaid Other

## 2014-10-31 VITALS — BP 168/103 | HR 71 | Temp 97.7°F | Resp 18 | Ht 65.0 in | Wt 192.2 lb

## 2014-10-31 DIAGNOSIS — D6481 Anemia due to antineoplastic chemotherapy: Secondary | ICD-10-CM | POA: Diagnosis not present

## 2014-10-31 DIAGNOSIS — C50411 Malignant neoplasm of upper-outer quadrant of right female breast: Secondary | ICD-10-CM | POA: Diagnosis not present

## 2014-10-31 DIAGNOSIS — G629 Polyneuropathy, unspecified: Secondary | ICD-10-CM

## 2014-10-31 DIAGNOSIS — C773 Secondary and unspecified malignant neoplasm of axilla and upper limb lymph nodes: Secondary | ICD-10-CM | POA: Diagnosis not present

## 2014-10-31 DIAGNOSIS — Z170 Estrogen receptor positive status [ER+]: Secondary | ICD-10-CM

## 2014-10-31 DIAGNOSIS — D509 Iron deficiency anemia, unspecified: Secondary | ICD-10-CM

## 2014-10-31 DIAGNOSIS — I10 Essential (primary) hypertension: Secondary | ICD-10-CM

## 2014-10-31 DIAGNOSIS — Z5111 Encounter for antineoplastic chemotherapy: Secondary | ICD-10-CM

## 2014-10-31 DIAGNOSIS — K219 Gastro-esophageal reflux disease without esophagitis: Secondary | ICD-10-CM

## 2014-10-31 LAB — COMPREHENSIVE METABOLIC PANEL (CC13)
ALT: 29 U/L (ref 0–55)
AST: 21 U/L (ref 5–34)
Albumin: 3.8 g/dL (ref 3.5–5.0)
Alkaline Phosphatase: 71 U/L (ref 40–150)
Anion Gap: 12 mEq/L — ABNORMAL HIGH (ref 3–11)
BILIRUBIN TOTAL: 0.45 mg/dL (ref 0.20–1.20)
BUN: 5.5 mg/dL — AB (ref 7.0–26.0)
CALCIUM: 9.3 mg/dL (ref 8.4–10.4)
CHLORIDE: 107 meq/L (ref 98–109)
CO2: 24 mEq/L (ref 22–29)
CREATININE: 0.8 mg/dL (ref 0.6–1.1)
EGFR: >90 mL/min/{1.73_m2} (ref >90)
Glucose: 107 mg/dl (ref 70–140)
Potassium: 3.7 mEq/L (ref 3.5–5.1)
Sodium: 143 mEq/L (ref 136–145)
Total Protein: 6.8 g/dL (ref 6.4–8.3)

## 2014-10-31 LAB — CBC WITH DIFFERENTIAL/PLATELET
BASO%: 1.6 % (ref 0.0–2.0)
BASOS ABS: 0.1 10*3/uL (ref 0.0–0.1)
EOS%: 2.7 % (ref 0.0–7.0)
Eosinophils Absolute: 0.2 10*3/uL (ref 0.0–0.5)
HEMATOCRIT: 33 % — AB (ref 34.8–46.6)
HEMOGLOBIN: 10.8 g/dL — AB (ref 11.6–15.9)
LYMPH#: 1 10*3/uL (ref 0.9–3.3)
LYMPH%: 18.5 % (ref 14.0–49.7)
MCH: 29.5 pg (ref 25.1–34.0)
MCHC: 32.7 g/dL (ref 31.5–36.0)
MCV: 90.2 fL (ref 79.5–101.0)
MONO#: 0.3 10*3/uL (ref 0.1–0.9)
MONO%: 4.7 % (ref 0.0–14.0)
NEUT#: 4 10*3/uL (ref 1.5–6.5)
NEUT%: 72.5 % (ref 38.4–76.8)
PLATELETS: 274 10*3/uL (ref 145–400)
RBC: 3.66 10*6/uL — ABNORMAL LOW (ref 3.70–5.45)
RDW: 18 % — ABNORMAL HIGH (ref 11.2–14.5)
WBC: 5.5 10*3/uL (ref 3.9–10.3)

## 2014-10-31 MED ORDER — FAMOTIDINE IN NACL 20-0.9 MG/50ML-% IV SOLN
INTRAVENOUS | Status: AC
  Filled 2014-10-31: qty 50

## 2014-10-31 MED ORDER — ONDANSETRON HCL 40 MG/20ML IJ SOLN
Freq: Once | INTRAMUSCULAR | Status: AC
  Administered 2014-10-31: 12:00:00 via INTRAVENOUS
  Filled 2014-10-31: qty 4

## 2014-10-31 MED ORDER — SODIUM CHLORIDE 0.9 % IV SOLN
Freq: Once | INTRAVENOUS | Status: AC
  Administered 2014-10-31: 12:00:00 via INTRAVENOUS

## 2014-10-31 MED ORDER — LISINOPRIL 5 MG PO TABS: 5.0000 mg | ORAL_TABLET | Freq: Every day | ORAL | Status: DC

## 2014-10-31 MED ORDER — DIPHENHYDRAMINE HCL 50 MG/ML IJ SOLN
50.0000 mg | Freq: Once | INTRAMUSCULAR | Status: AC
  Administered 2014-10-31: 50 mg via INTRAVENOUS

## 2014-10-31 MED ORDER — PACLITAXEL CHEMO INJECTION 300 MG/50ML
80.0000 mg/m2 | Freq: Once | INTRAVENOUS | Status: AC
  Administered 2014-10-31: 162 mg via INTRAVENOUS
  Filled 2014-10-31: qty 27

## 2014-10-31 MED ORDER — FAMOTIDINE IN NACL 20-0.9 MG/50ML-% IV SOLN
20.0000 mg | Freq: Once | INTRAVENOUS | Status: AC
  Administered 2014-10-31: 20 mg via INTRAVENOUS

## 2014-10-31 MED ORDER — SODIUM CHLORIDE 0.9 % IJ SOLN
10.0000 mL | INTRAMUSCULAR | Status: DC | PRN
  Administered 2014-10-31: 10 mL
  Filled 2014-10-31: qty 10

## 2014-10-31 MED ORDER — HEPARIN SOD (PORK) LOCK FLUSH 100 UNIT/ML IV SOLN
500.0000 [IU] | Freq: Once | INTRAVENOUS | Status: AC | PRN
  Administered 2014-10-31: 500 [IU]
  Filled 2014-10-31: qty 5

## 2014-10-31 MED ORDER — DIPHENHYDRAMINE HCL 50 MG/ML IJ SOLN
INTRAMUSCULAR | Status: AC
  Filled 2014-10-31: qty 1

## 2014-10-31 NOTE — Progress Notes (Signed)
Patient Care Team: No Pcp Per Patient as PCP - General (General Practice) Rolm Bookbinder, MD as Consulting Physician (General Surgery) Nicholas Lose, MD as Consulting Physician (Hematology and Oncology) Thea Silversmith, MD as Consulting Physician (Radiation Oncology) Holley Bouche, NP as Nurse Practitioner (Nurse Practitioner) Carola Frost, RN as Registered Nurse (Oncology)  DIAGNOSIS: Breast cancer of upper-outer quadrant of right female breast   Staging form: Breast, AJCC 7th Edition     Clinical stage from 07/26/2014: Stage IIB (T2, N1, M0) - Unsigned       Staging comments: Staged at breast conference on 12.16.15    SUMMARY OF ONCOLOGIC HISTORY:   Breast cancer of upper-outer quadrant of right female breast   07/13/2014 Mammogram indeterminate mass in the right breast by ultrasound measured 2 cm at 10:00 position   07/20/2014 Initial Diagnosis right breast biopsy: Invasive ductal carcinoma, 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 She received cycle 4 taxol last week and tolerated it quite well. Denies any neuropathy symptoms. Her BP is elevated today.  REVIEW OF SYSTEMS:   Constitutional: Denies fevers, chills or abnormal weight loss; Alopecia Eyes: Denies blurriness of vision Ears, nose, mouth, throat, and face: Denies mucositis or sore throat Respiratory: Denies cough, dyspnea or  wheezes Cardiovascular: Denies palpitation, chest discomfort or lower extremity swelling Gastrointestinal:  Denies nausea, heartburn or change in bowel habits Skin: Denies abnormal skin rashes Lymphatics: Denies new lymphadenopathy or easy bruising Neurological:Denies numbness, tingling or new weaknesses Behavioral/Psych: Mood is stable, no new changes  Breast:Can still feel the tumor but much smaller All other systems were reviewed with the patient and are negative.  I have reviewed the past medical history, past surgical history, social history and family history with the patient and they are unchanged from previous note.  ALLERGIES:  is allergic to tape.  MEDICATIONS:  Current Outpatient Prescriptions  Medication Sig Dispense Refill  . Atorvastatin Calcium (INVESTIGATIONAL ATORVASTATIN/PLACEBO) 40 MG tablet [facility] [id number] Take 1 tablet by mouth daily. Take 2 doses (these doses must be 12 hours apart) prior to first chemotherapy treatment. Then take 1 tablet daily by mouth with or without food. 180 tablet 0  . ferrous fumarate (HEMOCYTE - 106 MG FE) 325 (106 FE) MG TABS tablet Take 1 tablet by mouth daily.    Marland Kitchen lidocaine-prilocaine (EMLA) cream   3  . LORazepam (ATIVAN) 0.5 MG tablet Take 1 tablet (0.5 mg total) by mouth every 6 (six) hours as needed (Nausea or vomiting). 30 tablet 3  . ranitidine (ZANTAC) 300 MG tablet Take 1 tablet (300 mg total) by mouth at bedtime. 30 tablet 0  . venlafaxine XR (EFFEXOR-XR) 37.5 MG 24 hr capsule Take 1 capsule (37.5 mg total) by mouth daily with breakfast. 30 capsule 3  . lisinopril (PRINIVIL,ZESTRIL) 5 MG tablet Take 1 tablet (5 mg total) by mouth daily. 30 tablet 3   No current facility-administered medications for this visit.  PHYSICAL EXAMINATION: ECOG PERFORMANCE STATUS: 1 - Symptomatic but completely ambulatory  Filed Vitals:   10/31/14 1037  BP: 168/103  Pulse: 71  Temp: 97.7 F (36.5 C)  Resp: 18   Filed Weights    10/31/14 1037  Weight: 192 lb 3.2 oz (87.181 kg)    GENERAL:alert, no distress and comfortable SKIN: skin color, texture, turgor are normal, no rashes or significant lesions EYES: normal, Conjunctiva are pink and non-injected, sclera clear OROPHARYNX:no exudate, no erythema and lips, buccal mucosa, and tongue normal  NECK: supple, thyroid normal size, non-tender, without nodularity LYMPH:  no palpable lymphadenopathy in the cervical, axillary or inguinal LUNGS: clear to auscultation and percussion with normal breathing effort HEART: regular rate & rhythm and no murmurs and no lower extremity edema ABDOMEN:abdomen soft, non-tender and normal bowel sounds Musculoskeletal:no cyanosis of digits and no clubbing  NEURO: alert & oriented x 3 with fluent speech, no focal motor/sensory deficits LABORATORY DATA:  I have reviewed the data as listed   Chemistry      Component Value Date/Time   NA 143 10/31/2014 1023   NA 138 01/22/2011 2330   K 3.7 10/31/2014 1023   K 3.6 01/22/2011 2330   CL 104 01/22/2011 2330   CO2 24 10/31/2014 1023   CO2 28 01/22/2011 2330   BUN 5.5* 10/31/2014 1023   BUN 9 01/22/2011 2330   CREATININE 0.8 10/31/2014 1023   CREATININE 0.72 01/22/2011 2330      Component Value Date/Time   CALCIUM 9.3 10/31/2014 1023   CALCIUM 9.2 01/22/2011 2330   ALKPHOS 71 10/31/2014 1023   ALKPHOS 55 01/22/2011 2330   AST 21 10/31/2014 1023   AST 21 01/22/2011 2330   ALT 29 10/31/2014 1023   ALT 15 01/22/2011 2330   BILITOT 0.45 10/31/2014 1023   BILITOT 0.2* 01/22/2011 2330       Lab Results  Component Value Date   WBC 5.5 10/31/2014   HGB 10.8* 10/31/2014   HCT 33.0* 10/31/2014   MCV 90.2 10/31/2014   PLT 274 10/31/2014   NEUTROABS 4.0 10/31/2014    ASSESSMENT & PLAN:  Breast cancer of upper-outer quadrant of right female breast 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  Toxicities to chemotherapy 1. Acid reflux: Patient is taking Zantac and we decreased the dosage of Decadron.  2. Chest pressure: Probably related to #1: Resolved with Zantac 3. Alopecia 4. Loss of taste 5. Hot flashes at night: Stopped gabapentin and initiated Effexor XR 6. Chemo induced anemia grade 2 hemoglobin stable at 9.4. 7. Hypertension: Prescribed her Lisinopril 5 mg daily. Will monitor it.  Monitoring her closely for toxicities to chemotherapy. 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 Her blood counts were reviewed and she has adequate for her chemotherapy.  Iron deficiency anemia:Off oral iron.She is also receiving Zoladex injections to stop her menstrual cycles which have been very heavy previously. Iron studies revealed 27% saturation with a ferritin of 115. Hemoglobin electrophoresis did not reveal any hemoglobinopathies. hemoglobin today is 9.4 gm  Return to clinic in 2 weeks and weekly for Taxol treatments    No orders of  the defined types were placed in this encounter.   The patient has a good understanding of the overall plan. she agrees with it. She will call with any problems that may develop before her next visit here.   Rulon Eisenmenger, MD

## 2014-10-31 NOTE — Telephone Encounter (Signed)
per pof to sch pt appt-pt sch for 2 weeks already-gave pt copy of sch

## 2014-10-31 NOTE — Patient Instructions (Signed)
[facility] Discharge Instructions for Patients Receiving Chemotherapy  Today you received the following chemotherapy agents Taxol  To help prevent nausea and vomiting after your treatment, we encourage you to take your nausea medication    If you develop nausea and vomiting that is not controlled by your nausea medication, call the clinic.   BELOW ARE SYMPTOMS THAT SHOULD BE REPORTED IMMEDIATELY:  *FEVER GREATER THAN 100.5 F  *CHILLS WITH OR WITHOUT FEVER  NAUSEA AND VOMITING THAT IS NOT CONTROLLED WITH YOUR NAUSEA MEDICATION  *UNUSUAL SHORTNESS OF BREATH  *UNUSUAL BRUISING OR BLEEDING  TENDERNESS IN MOUTH AND THROAT WITH OR WITHOUT PRESENCE OF ULCERS  *URINARY PROBLEMS  *BOWEL PROBLEMS  UNUSUAL RASH Items with * indicate a potential emergency and should be followed up as soon as possible.  Feel free to call the clinic you have any questions or concerns. The clinic phone number is [phone].    

## 2014-10-31 NOTE — Assessment & Plan Note (Signed)
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  Toxicities to chemotherapy 1. Acid reflux: Patient is taking Zantac and we decreased the dosage of Decadron.  2. Chest pressure: Probably related to #1: Resolved with Zantac 3. Alopecia 4. Loss of taste 5. Hot flashes at night: Stopped gabapentin and initiated Effexor XR 6. Chemo induced anemia grade 2 hemoglobin stable at 9.4.  Monitoring her closely for toxicities to chemotherapy. 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 Her blood counts were reviewed and she has adequate for her chemotherapy.  Iron deficiency anemia:Off oral iron.She is also receiving Zoladex injections to stop her menstrual cycles which have been very heavy previously. Iron studies revealed 27% saturation with a ferritin of 115. Hemoglobin electrophoresis did not reveal any hemoglobinopathies. hemoglobin today is 9.4 gm  Return to clinic in 2 weeks and weekly for Taxol treatments

## 2014-11-07 ENCOUNTER — Other Ambulatory Visit (HOSPITAL_BASED_OUTPATIENT_CLINIC_OR_DEPARTMENT_OTHER): Payer: Medicaid Other

## 2014-11-07 ENCOUNTER — Ambulatory Visit (HOSPITAL_BASED_OUTPATIENT_CLINIC_OR_DEPARTMENT_OTHER): Payer: Medicaid Other

## 2014-11-07 DIAGNOSIS — Z5111 Encounter for antineoplastic chemotherapy: Secondary | ICD-10-CM

## 2014-11-07 DIAGNOSIS — C773 Secondary and unspecified malignant neoplasm of axilla and upper limb lymph nodes: Secondary | ICD-10-CM

## 2014-11-07 DIAGNOSIS — C50411 Malignant neoplasm of upper-outer quadrant of right female breast: Secondary | ICD-10-CM

## 2014-11-07 LAB — CBC WITH DIFFERENTIAL/PLATELET
BASO%: 1.8 % (ref 0.0–2.0)
BASOS ABS: 0.1 10*3/uL (ref 0.0–0.1)
EOS%: 3 % (ref 0.0–7.0)
Eosinophils Absolute: 0.1 10*3/uL (ref 0.0–0.5)
HCT: 30.6 % — ABNORMAL LOW (ref 34.8–46.6)
HEMOGLOBIN: 10.1 g/dL — AB (ref 11.6–15.9)
LYMPH%: 16.5 % (ref 14.0–49.7)
MCH: 29.9 pg (ref 25.1–34.0)
MCHC: 33.1 g/dL (ref 31.5–36.0)
MCV: 90.4 fL (ref 79.5–101.0)
MONO#: 0.3 10*3/uL (ref 0.1–0.9)
MONO%: 6.7 % (ref 0.0–14.0)
NEUT#: 2.9 10*3/uL (ref 1.5–6.5)
NEUT%: 72 % (ref 38.4–76.8)
Platelets: 302 10*3/uL (ref 145–400)
RBC: 3.39 10*6/uL — ABNORMAL LOW (ref 3.70–5.45)
RDW: 17.9 % — ABNORMAL HIGH (ref 11.2–14.5)
WBC: 4.1 10*3/uL (ref 3.9–10.3)
lymph#: 0.7 10*3/uL — ABNORMAL LOW (ref 0.9–3.3)

## 2014-11-07 LAB — COMPREHENSIVE METABOLIC PANEL (CC13)
ALT: 26 U/L (ref 0–55)
AST: 22 U/L (ref 5–34)
Albumin: 3.8 g/dL (ref 3.5–5.0)
Alkaline Phosphatase: 65 U/L (ref 40–150)
Anion Gap: 10 mEq/L (ref 3–11)
BUN: 6.9 mg/dL — ABNORMAL LOW (ref 7.0–26.0)
CHLORIDE: 107 meq/L (ref 98–109)
CO2: 26 mEq/L (ref 22–29)
Calcium: 9.2 mg/dL (ref 8.4–10.4)
Creatinine: 0.8 mg/dL (ref 0.6–1.1)
Glucose: 102 mg/dl (ref 70–140)
Potassium: 4 mEq/L (ref 3.5–5.1)
Sodium: 142 mEq/L (ref 136–145)
Total Bilirubin: 0.42 mg/dL (ref 0.20–1.20)
Total Protein: 6.7 g/dL (ref 6.4–8.3)

## 2014-11-07 MED ORDER — SODIUM CHLORIDE 0.9 % IJ SOLN
10.0000 mL | INTRAMUSCULAR | Status: DC | PRN
Start: 2014-11-07 — End: 2014-11-07
  Administered 2014-11-07: 10 mL
  Filled 2014-11-07: qty 10

## 2014-11-07 MED ORDER — DIPHENHYDRAMINE HCL 50 MG/ML IJ SOLN
50.0000 mg | Freq: Once | INTRAMUSCULAR | Status: AC
  Administered 2014-11-07: 50 mg via INTRAVENOUS

## 2014-11-07 MED ORDER — PACLITAXEL CHEMO INJECTION 300 MG/50ML
80.0000 mg/m2 | Freq: Once | INTRAVENOUS | Status: AC
  Administered 2014-11-07: 162 mg via INTRAVENOUS
  Filled 2014-11-07: qty 27

## 2014-11-07 MED ORDER — SODIUM CHLORIDE 0.9 % IV SOLN
Freq: Once | INTRAVENOUS | Status: AC
  Administered 2014-11-07: 10:00:00 via INTRAVENOUS

## 2014-11-07 MED ORDER — FAMOTIDINE IN NACL 20-0.9 MG/50ML-% IV SOLN
20.0000 mg | Freq: Once | INTRAVENOUS | Status: AC
Start: 2014-11-07 — End: 2014-11-07
  Administered 2014-11-07: 20 mg via INTRAVENOUS

## 2014-11-07 MED ORDER — DIPHENHYDRAMINE HCL 50 MG/ML IJ SOLN
INTRAMUSCULAR | Status: AC
  Filled 2014-11-07: qty 1

## 2014-11-07 MED ORDER — FAMOTIDINE IN NACL 20-0.9 MG/50ML-% IV SOLN
INTRAVENOUS | Status: AC
  Filled 2014-11-07: qty 50

## 2014-11-07 MED ORDER — SODIUM CHLORIDE 0.9 % IV SOLN
Freq: Once | INTRAVENOUS | Status: AC
  Administered 2014-11-07: 11:00:00 via INTRAVENOUS
  Filled 2014-11-07: qty 4

## 2014-11-07 MED ORDER — HEPARIN SOD (PORK) LOCK FLUSH 100 UNIT/ML IV SOLN
500.0000 [IU] | Freq: Once | INTRAVENOUS | Status: AC | PRN
  Administered 2014-11-07: 500 [IU]
  Filled 2014-11-07: qty 5

## 2014-11-07 NOTE — Patient Instructions (Signed)
[facility] Discharge Instructions for Patients Receiving Chemotherapy  Today you received the following chemotherapy agent: Taxol   To help prevent nausea and vomiting after your treatment, we encourage you to take your nausea medication as prescribed.    If you develop nausea and vomiting that is not controlled by your nausea medication, call the clinic.   BELOW ARE SYMPTOMS THAT SHOULD BE REPORTED IMMEDIATELY:  *FEVER GREATER THAN 100.5 F  *CHILLS WITH OR WITHOUT FEVER  NAUSEA AND VOMITING THAT IS NOT CONTROLLED WITH YOUR NAUSEA MEDICATION  *UNUSUAL SHORTNESS OF BREATH  *UNUSUAL BRUISING OR BLEEDING  TENDERNESS IN MOUTH AND THROAT WITH OR WITHOUT PRESENCE OF ULCERS  *URINARY PROBLEMS  *BOWEL PROBLEMS  UNUSUAL RASH Items with * indicate a potential emergency and should be followed up as soon as possible.  Feel free to call the clinic you have any questions or concerns. The clinic phone number is [phone].  Please show the CHEMO ALERT CARD at check-in to the Emergency Department and triage nurse.   

## 2014-11-07 NOTE — Progress Notes (Signed)
Patient with hypertension today 150/94. Patient has been taking lisinopril 5 mg daily x1 week since getting prescription from Dr. Lindi Adie. Informed Beverlee Nims, RN to MD. She is to report to MD and notify with any changes.   Per Beverlee Nims, RN, no changes to BP med. MD to assess and discuss at next office visit. Patient informed with verbal understanding.

## 2014-11-14 ENCOUNTER — Ambulatory Visit (HOSPITAL_BASED_OUTPATIENT_CLINIC_OR_DEPARTMENT_OTHER): Payer: Medicaid Other | Admitting: Hematology and Oncology

## 2014-11-14 ENCOUNTER — Ambulatory Visit (HOSPITAL_BASED_OUTPATIENT_CLINIC_OR_DEPARTMENT_OTHER): Payer: Medicaid Other

## 2014-11-14 ENCOUNTER — Other Ambulatory Visit (HOSPITAL_BASED_OUTPATIENT_CLINIC_OR_DEPARTMENT_OTHER): Payer: Medicaid Other

## 2014-11-14 VITALS — BP 157/100 | HR 71 | Temp 97.7°F | Resp 18 | Ht 65.0 in | Wt 186.4 lb

## 2014-11-14 DIAGNOSIS — C773 Secondary and unspecified malignant neoplasm of axilla and upper limb lymph nodes: Secondary | ICD-10-CM

## 2014-11-14 DIAGNOSIS — G620 Drug-induced polyneuropathy: Secondary | ICD-10-CM

## 2014-11-14 DIAGNOSIS — D509 Iron deficiency anemia, unspecified: Secondary | ICD-10-CM

## 2014-11-14 DIAGNOSIS — D6481 Anemia due to antineoplastic chemotherapy: Secondary | ICD-10-CM | POA: Diagnosis not present

## 2014-11-14 DIAGNOSIS — C50411 Malignant neoplasm of upper-outer quadrant of right female breast: Secondary | ICD-10-CM

## 2014-11-14 DIAGNOSIS — N951 Menopausal and female climacteric states: Secondary | ICD-10-CM

## 2014-11-14 DIAGNOSIS — Z5111 Encounter for antineoplastic chemotherapy: Secondary | ICD-10-CM

## 2014-11-14 DIAGNOSIS — R530 Neoplastic (malignant) related fatigue: Secondary | ICD-10-CM

## 2014-11-14 DIAGNOSIS — Z170 Estrogen receptor positive status [ER+]: Secondary | ICD-10-CM | POA: Diagnosis not present

## 2014-11-14 DIAGNOSIS — I10 Essential (primary) hypertension: Secondary | ICD-10-CM

## 2014-11-14 DIAGNOSIS — K219 Gastro-esophageal reflux disease without esophagitis: Secondary | ICD-10-CM

## 2014-11-14 LAB — COMPREHENSIVE METABOLIC PANEL (CC13)
ALBUMIN: 3.9 g/dL (ref 3.5–5.0)
ALT: 25 U/L (ref 0–55)
AST: 22 U/L (ref 5–34)
Alkaline Phosphatase: 67 U/L (ref 40–150)
Anion Gap: 10 mEq/L (ref 3–11)
BUN: 7.9 mg/dL (ref 7.0–26.0)
CALCIUM: 9.2 mg/dL (ref 8.4–10.4)
CHLORIDE: 108 meq/L (ref 98–109)
CO2: 25 mEq/L (ref 22–29)
Creatinine: 0.8 mg/dL (ref 0.6–1.1)
GLUCOSE: 89 mg/dL (ref 70–140)
POTASSIUM: 3.9 meq/L (ref 3.5–5.1)
Sodium: 143 mEq/L (ref 136–145)
Total Bilirubin: 0.54 mg/dL (ref 0.20–1.20)
Total Protein: 6.6 g/dL (ref 6.4–8.3)

## 2014-11-14 LAB — CBC WITH DIFFERENTIAL/PLATELET
BASO%: 0.8 % (ref 0.0–2.0)
Basophils Absolute: 0 10*3/uL (ref 0.0–0.1)
EOS ABS: 0.1 10*3/uL (ref 0.0–0.5)
EOS%: 2.9 % (ref 0.0–7.0)
HCT: 31.6 % — ABNORMAL LOW (ref 34.8–46.6)
HGB: 10.2 g/dL — ABNORMAL LOW (ref 11.6–15.9)
LYMPH%: 19.8 % (ref 14.0–49.7)
MCH: 29.5 pg (ref 25.1–34.0)
MCHC: 32.3 g/dL (ref 31.5–36.0)
MCV: 91.3 fL (ref 79.5–101.0)
MONO#: 0.3 10*3/uL (ref 0.1–0.9)
MONO%: 7.9 % (ref 0.0–14.0)
NEUT%: 68.6 % (ref 38.4–76.8)
NEUTROS ABS: 2.6 10*3/uL (ref 1.5–6.5)
PLATELETS: 346 10*3/uL (ref 145–400)
RBC: 3.46 10*6/uL — AB (ref 3.70–5.45)
RDW: 16.4 % — AB (ref 11.2–14.5)
WBC: 3.8 10*3/uL — ABNORMAL LOW (ref 3.9–10.3)
lymph#: 0.8 10*3/uL — ABNORMAL LOW (ref 0.9–3.3)

## 2014-11-14 MED ORDER — SODIUM CHLORIDE 0.9 % IV SOLN
Freq: Once | INTRAVENOUS | Status: AC
  Administered 2014-11-14: 10:00:00 via INTRAVENOUS

## 2014-11-14 MED ORDER — PALONOSETRON HCL INJECTION 0.25 MG/5ML
0.2500 mg | Freq: Once | INTRAVENOUS | Status: AC
  Administered 2014-11-14: 0.25 mg via INTRAVENOUS

## 2014-11-14 MED ORDER — DEXTROSE 5 % IV SOLN
65.0000 mg/m2 | Freq: Once | INTRAVENOUS | Status: AC
  Administered 2014-11-14: 132 mg via INTRAVENOUS
  Filled 2014-11-14: qty 22

## 2014-11-14 MED ORDER — PALONOSETRON HCL INJECTION 0.25 MG/5ML
INTRAVENOUS | Status: AC
  Filled 2014-11-14: qty 5

## 2014-11-14 MED ORDER — FAMOTIDINE IN NACL 20-0.9 MG/50ML-% IV SOLN
INTRAVENOUS | Status: AC
  Filled 2014-11-14: qty 50

## 2014-11-14 MED ORDER — DIPHENHYDRAMINE HCL 50 MG/ML IJ SOLN
50.0000 mg | Freq: Once | INTRAMUSCULAR | Status: AC
  Administered 2014-11-14: 50 mg via INTRAVENOUS

## 2014-11-14 MED ORDER — DIPHENHYDRAMINE HCL 50 MG/ML IJ SOLN
INTRAMUSCULAR | Status: AC
  Filled 2014-11-14: qty 1

## 2014-11-14 MED ORDER — SODIUM CHLORIDE 0.9 % IJ SOLN
10.0000 mL | INTRAMUSCULAR | Status: DC | PRN
  Administered 2014-11-14: 10 mL
  Filled 2014-11-14: qty 10

## 2014-11-14 MED ORDER — FAMOTIDINE IN NACL 20-0.9 MG/50ML-% IV SOLN
20.0000 mg | Freq: Once | INTRAVENOUS | Status: AC
  Administered 2014-11-14: 20 mg via INTRAVENOUS

## 2014-11-14 MED ORDER — HEPARIN SOD (PORK) LOCK FLUSH 100 UNIT/ML IV SOLN
500.0000 [IU] | Freq: Once | INTRAVENOUS | Status: AC | PRN
  Administered 2014-11-14: 500 [IU]
  Filled 2014-11-14: qty 5

## 2014-11-14 MED ORDER — LISINOPRIL 10 MG PO TABS: 10.0000 mg | ORAL_TABLET | Freq: Every day | ORAL | Status: DC

## 2014-11-14 NOTE — Progress Notes (Signed)
Patient Care Team: No Pcp Per Patient as PCP - General (General Practice) Rolm Bookbinder, MD as Consulting Physician (General Surgery) Nicholas Lose, MD as Consulting Physician (Hematology and Oncology) Thea Silversmith, MD as Consulting Physician (Radiation Oncology) Holley Bouche, NP as Nurse Practitioner (Nurse Practitioner) Carola Frost, RN as Registered Nurse (Oncology)  DIAGNOSIS: Breast cancer of upper-outer quadrant of right female breast   Staging form: Breast, AJCC 7th Edition     Clinical stage from 07/26/2014: Stage IIB (T2, N1, M0) - Unsigned       Staging comments: Staged at breast conference on 12.16.15    SUMMARY OF ONCOLOGIC HISTORY:   Breast cancer of upper-outer quadrant of right female breast   07/13/2014 Mammogram indeterminate mass in the right breast by ultrasound measured 2 cm at 10:00 position   07/20/2014 Initial Diagnosis right breast biopsy: Invasive ductal carcinoma, 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 She complains of worsening nausea and worsening fatigue as well as itching sensation of hands and feet. She also complains of change in the color. Denies any neuropathy symptoms.  REVIEW OF SYSTEMS:   Constitutional: Denies fevers, chills or abnormal weight loss Eyes: Denies blurriness of vision Ears, nose, mouth, throat, and face: Denies  mucositis or sore throat Respiratory: Denies cough, dyspnea or wheezes Cardiovascular: Denies palpitation, chest discomfort or lower extremity swelling Gastrointestinal:  Denies nausea, heartburn or change in bowel habits Skin: Denies abnormal skin rashes Lymphatics: Denies new lymphadenopathy or easy bruising Neurological:Denies numbness, tingling or new weaknesses Behavioral/Psych: Mood is stable, no new changes  All other systems were reviewed with the patient and are negative.  I have reviewed the past medical history, past surgical history, social history and family history with the patient and they are unchanged from previous note.  ALLERGIES:  is allergic to tape.  MEDICATIONS:  Current Outpatient Prescriptions  Medication Sig Dispense Refill  . Atorvastatin Calcium (INVESTIGATIONAL ATORVASTATIN/PLACEBO) 40 MG tablet [facility] [address] Take 1 tablet by mouth daily. Take 2 doses (these doses must be 12 hours apart) prior to first chemotherapy treatment. Then take 1 tablet daily by mouth with or without food. 180 tablet 0  . ferrous fumarate (HEMOCYTE - 106 MG FE) 325 (106 FE) MG TABS tablet Take 1 tablet by mouth daily.    Marland Kitchen lidocaine-prilocaine (EMLA) cream   3  . lisinopril (PRINIVIL,ZESTRIL) 5 MG tablet Take 1 tablet (5 mg total) by mouth daily. 30 tablet 3  . LORazepam (ATIVAN) 0.5 MG tablet Take 1 tablet (0.5 mg total) by mouth every 6 (six) hours as needed (Nausea or vomiting). 30 tablet 3  . ranitidine (ZANTAC) 300 MG tablet Take 1 tablet (300 mg total) by mouth at bedtime. 30 tablet 0  . venlafaxine XR (EFFEXOR-XR) 37.5 MG 24 hr capsule Take 1 capsule (37.5 mg total) by mouth daily with breakfast. 30 capsule 3   No current facility-administered medications for this visit.  PHYSICAL EXAMINATION: ECOG PERFORMANCE STATUS: 1 - Symptomatic but completely ambulatory  Filed Vitals:   11/14/14 0917  BP: 157/100  Pulse: 71  Temp: 97.7 F (36.5 C)  Resp: 18   Filed  Weights   11/14/14 0917  Weight: 186 lb 6.4 oz (84.55 kg)    GENERAL:alert, no distress and comfortable SKIN: skin color, texture, turgor are normal, no rashes or significant lesions EYES: normal, Conjunctiva are pink and non-injected, sclera clear OROPHARYNX:no exudate, no erythema and lips, buccal mucosa, and tongue normal  NECK: supple, thyroid normal size, non-tender, without nodularity LYMPH:  no palpable lymphadenopathy in the cervical, axillary or inguinal LUNGS: clear to auscultation and percussion with normal breathing effort HEART: regular rate & rhythm and no murmurs and no lower extremity edema ABDOMEN:abdomen soft, non-tender and normal bowel sounds Musculoskeletal:no cyanosis of digits and no clubbing  NEURO: alert & oriented x 3 with fluent speech, no focal motor/sensory deficits  LABORATORY DATA:  I have reviewed the data as listed   Chemistry      Component Value Date/Time   NA 142 11/07/2014 0929   NA 138 01/22/2011 2330   K 4.0 11/07/2014 0929   K 3.6 01/22/2011 2330   CL 104 01/22/2011 2330   CO2 26 11/07/2014 0929   CO2 28 01/22/2011 2330   BUN 6.9* 11/07/2014 0929   BUN 9 01/22/2011 2330   CREATININE 0.8 11/07/2014 0929   CREATININE 0.72 01/22/2011 2330      Component Value Date/Time   CALCIUM 9.2 11/07/2014 0929   CALCIUM 9.2 01/22/2011 2330   ALKPHOS 65 11/07/2014 0929   ALKPHOS 55 01/22/2011 2330   AST 22 11/07/2014 0929   AST 21 01/22/2011 2330   ALT 26 11/07/2014 0929   ALT 15 01/22/2011 2330   BILITOT 0.42 11/07/2014 0929   BILITOT 0.2* 01/22/2011 2330       Lab Results  Component Value Date   WBC 3.8* 11/14/2014   HGB 10.2* 11/14/2014   HCT 31.6* 11/14/2014   MCV 91.3 11/14/2014   PLT 346 11/14/2014   NEUTROABS 2.6 11/14/2014    ASSESSMENT & PLAN:  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  Toxicities to chemotherapy 1. Acid reflux: Patient is taking Zantac and we decreased the dosage of Decadron.  2. Chest pressure: Probably related to #1: Resolved with Zantac 3. Alopecia 4. Loss of taste 5. Hot flashes at night: Stopped gabapentin and initiated Effexor XR 6. Chemo induced anemia grade 2 hemoglobin stable  7. Hypertension: Prescribed her Lisinopril 5 mg daily. Will monitor it. 8. Nausea on Taxol 9. Itching sensation of hands and feet related to Taxol: Recommended Benadryl oral pills 10 fatigue related to chemotherapy: Decrease the dosage of Taxol to 65 mg meter square.   Monitoring her closely for toxicities to chemotherapy. 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 Her blood counts were reviewed and she has adequate for her chemotherapy.  Iron deficiency anemia:Off oral iron.She is also receiving Zoladex injections to stop her menstrual cycles which have been very heavy previously. Iron studies revealed 27% saturation with  a ferritin of 115. Hemoglobin electrophoresis did not reveal any hemoglobinopathies. Hypertension: I will increase the dosage of lisinopril to 10 mg daily   Return to clinic in 2 weeks and weekly for Taxol treatments     No orders of the defined types were placed in this encounter.   The patient has a good understanding of the overall plan. she agrees with it. She will call with any problems that may develop before her next visit here.   Rulon Eisenmenger, MD

## 2014-11-14 NOTE — Patient Instructions (Signed)
[facility] [place] Discharge Instructions for Patients Receiving Chemotherapy  Today you received the following chemotherapy agents Taxol  To help prevent nausea and vomiting after your treatment, we encourage you to take your nausea medication ativan 0.5 mg as ordered by Dr. Lindi Adie.    If you develop nausea and vomiting that is not controlled by your nausea medication, call the clinic.   BELOW ARE SYMPTOMS THAT SHOULD BE REPORTED IMMEDIATELY:  *FEVER GREATER THAN 100.5 F  *CHILLS WITH OR WITHOUT FEVER  NAUSEA AND VOMITING THAT IS NOT CONTROLLED WITH YOUR NAUSEA MEDICATION  *UNUSUAL SHORTNESS OF BREATH  *UNUSUAL BRUISING OR BLEEDING  TENDERNESS IN MOUTH AND THROAT WITH OR WITHOUT PRESENCE OF ULCERS  *URINARY PROBLEMS  *BOWEL PROBLEMS  UNUSUAL RASH Items with * indicate a potential emergency and should be followed up as soon as possible.  Feel free to call the clinic you have any questions or concerns. The clinic phone number is [phone].  Please show the [place] at check-in to the Emergency Department and triage nurse.

## 2014-11-14 NOTE — Assessment & Plan Note (Addendum)
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  Toxicities to chemotherapy 1. Acid reflux: Patient is taking Zantac and we decreased the dosage of Decadron.  2. Chest pressure: Probably related to #1: Resolved with Zantac 3. Alopecia 4. Loss of taste 5. Hot flashes at night: Stopped gabapentin and initiated Effexor XR 6. Chemo induced anemia grade 2 hemoglobin stable  7. Hypertension: Prescribed her Lisinopril 5 mg daily. Will monitor it. 8. Nausea on Taxol 9. Itching sensation of hands and feet related to Taxol: Recommended Benadryl oral pills 10 fatigue related to chemotherapy: Decrease the dosage of Taxol to 65 mg meter square.   Monitoring her closely for toxicities to chemotherapy. 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 Her blood counts were reviewed and she has adequate for her chemotherapy.  Iron deficiency anemia:Off oral iron.She is also receiving Zoladex injections to stop her menstrual cycles which have been very heavy previously. Iron studies revealed 27% saturation with a ferritin of 115. Hemoglobin electrophoresis did not reveal any hemoglobinopathies. Hypertension: I will increase the dosage of lisinopril to 10 mg daily   Return to clinic in 2 weeks and weekly for Taxol treatments

## 2014-11-14 NOTE — Addendum Note (Signed)
Addended by: Prentiss Bells on: 11/14/2014 01:28 PM   Modules accepted: Orders

## 2014-11-14 NOTE — Progress Notes (Signed)
Discharged at 1224, ambulatory in no distress

## 2014-11-21 ENCOUNTER — Ambulatory Visit (HOSPITAL_BASED_OUTPATIENT_CLINIC_OR_DEPARTMENT_OTHER): Payer: Medicaid Other

## 2014-11-21 ENCOUNTER — Other Ambulatory Visit (HOSPITAL_BASED_OUTPATIENT_CLINIC_OR_DEPARTMENT_OTHER): Payer: Medicaid Other

## 2014-11-21 DIAGNOSIS — C50411 Malignant neoplasm of upper-outer quadrant of right female breast: Secondary | ICD-10-CM

## 2014-11-21 DIAGNOSIS — C773 Secondary and unspecified malignant neoplasm of axilla and upper limb lymph nodes: Secondary | ICD-10-CM

## 2014-11-21 DIAGNOSIS — Z5111 Encounter for antineoplastic chemotherapy: Secondary | ICD-10-CM

## 2014-11-21 LAB — CBC WITH DIFFERENTIAL/PLATELET
BASO%: 0.7 % (ref 0.0–2.0)
Basophils Absolute: 0 10*3/uL (ref 0.0–0.1)
EOS%: 3.1 % (ref 0.0–7.0)
Eosinophils Absolute: 0.1 10*3/uL (ref 0.0–0.5)
HCT: 31 % — ABNORMAL LOW (ref 34.8–46.6)
HEMOGLOBIN: 10 g/dL — AB (ref 11.6–15.9)
LYMPH%: 21.3 % (ref 14.0–49.7)
MCH: 29.8 pg (ref 25.1–34.0)
MCHC: 32.3 g/dL (ref 31.5–36.0)
MCV: 92.3 fL (ref 79.5–101.0)
MONO#: 0.3 10*3/uL (ref 0.1–0.9)
MONO%: 6.5 % (ref 0.0–14.0)
NEUT#: 2.9 10*3/uL (ref 1.5–6.5)
NEUT%: 68.4 % (ref 38.4–76.8)
PLATELETS: 326 10*3/uL (ref 145–400)
RBC: 3.36 10*6/uL — ABNORMAL LOW (ref 3.70–5.45)
RDW: 15.9 % — AB (ref 11.2–14.5)
WBC: 4.2 10*3/uL (ref 3.9–10.3)
lymph#: 0.9 10*3/uL (ref 0.9–3.3)

## 2014-11-21 LAB — COMPREHENSIVE METABOLIC PANEL (CC13)
ALT: 23 U/L (ref 0–55)
AST: 20 U/L (ref 5–34)
Albumin: 3.9 g/dL (ref 3.5–5.0)
Alkaline Phosphatase: 61 U/L (ref 40–150)
Anion Gap: 8 mEq/L (ref 3–11)
BUN: 10.5 mg/dL (ref 7.0–26.0)
CHLORIDE: 108 meq/L (ref 98–109)
CO2: 26 mEq/L (ref 22–29)
CREATININE: 0.8 mg/dL (ref 0.6–1.1)
Calcium: 9.1 mg/dL (ref 8.4–10.4)
EGFR: >90 mL/min/{1.73_m2} (ref >90)
Glucose: 99 mg/dl (ref 70–140)
Potassium: 4.3 mEq/L (ref 3.5–5.1)
Sodium: 142 mEq/L (ref 136–145)
Total Bilirubin: 0.43 mg/dL (ref 0.20–1.20)
Total Protein: 6.5 g/dL (ref 6.4–8.3)

## 2014-11-21 MED ORDER — HEPARIN SOD (PORK) LOCK FLUSH 100 UNIT/ML IV SOLN
500.0000 [IU] | Freq: Once | INTRAVENOUS | Status: AC | PRN
  Administered 2014-11-21: 500 [IU]
  Filled 2014-11-21: qty 5

## 2014-11-21 MED ORDER — SODIUM CHLORIDE 0.9 % IV SOLN
Freq: Once | INTRAVENOUS | Status: AC
  Administered 2014-11-21: 10:00:00 via INTRAVENOUS

## 2014-11-21 MED ORDER — DIPHENHYDRAMINE HCL 50 MG/ML IJ SOLN
50.0000 mg | Freq: Once | INTRAMUSCULAR | Status: AC
  Administered 2014-11-21: 50 mg via INTRAVENOUS

## 2014-11-21 MED ORDER — DIPHENHYDRAMINE HCL 50 MG/ML IJ SOLN
INTRAMUSCULAR | Status: AC
  Filled 2014-11-21: qty 1

## 2014-11-21 MED ORDER — FAMOTIDINE IN NACL 20-0.9 MG/50ML-% IV SOLN
20.0000 mg | Freq: Once | INTRAVENOUS | Status: AC
  Administered 2014-11-21: 20 mg via INTRAVENOUS

## 2014-11-21 MED ORDER — PALONOSETRON HCL INJECTION 0.25 MG/5ML
INTRAVENOUS | Status: AC
  Filled 2014-11-21: qty 5

## 2014-11-21 MED ORDER — PACLITAXEL CHEMO INJECTION 300 MG/50ML
65.0000 mg/m2 | Freq: Once | INTRAVENOUS | Status: AC
  Administered 2014-11-21: 132 mg via INTRAVENOUS
  Filled 2014-11-21: qty 22

## 2014-11-21 MED ORDER — PALONOSETRON HCL INJECTION 0.25 MG/5ML
0.2500 mg | Freq: Once | INTRAVENOUS | Status: AC
  Administered 2014-11-21: 0.25 mg via INTRAVENOUS

## 2014-11-21 MED ORDER — SODIUM CHLORIDE 0.9 % IJ SOLN
10.0000 mL | INTRAMUSCULAR | Status: DC | PRN
  Administered 2014-11-21: 10 mL
  Filled 2014-11-21: qty 10

## 2014-11-21 MED ORDER — FAMOTIDINE IN NACL 20-0.9 MG/50ML-% IV SOLN
INTRAVENOUS | Status: AC
  Filled 2014-11-21: qty 50

## 2014-11-21 NOTE — Patient Instructions (Signed)
[facility] Discharge Instructions for Patients Receiving Chemotherapy  Today you received the following chemotherapy agents:  Taxol  To help prevent nausea and vomiting after your treatment, we encourage you to take your nausea medication as prescribed.   If you develop nausea and vomiting that is not controlled by your nausea medication, call the clinic.   BELOW ARE SYMPTOMS THAT SHOULD BE REPORTED IMMEDIATELY:  *FEVER GREATER THAN 100.5 F  *CHILLS WITH OR WITHOUT FEVER  NAUSEA AND VOMITING THAT IS NOT CONTROLLED WITH YOUR NAUSEA MEDICATION  *UNUSUAL SHORTNESS OF BREATH  *UNUSUAL BRUISING OR BLEEDING  TENDERNESS IN MOUTH AND THROAT WITH OR WITHOUT PRESENCE OF ULCERS  *URINARY PROBLEMS  *BOWEL PROBLEMS  UNUSUAL RASH Items with * indicate a potential emergency and should be followed up as soon as possible.  Feel free to call the clinic you have any questions or concerns. The clinic phone number is [phone].  Please show the CHEMO ALERT CARD at check-in to the Emergency Department and triage nurse.   

## 2014-11-28 ENCOUNTER — Other Ambulatory Visit (HOSPITAL_BASED_OUTPATIENT_CLINIC_OR_DEPARTMENT_OTHER): Payer: Medicaid Other

## 2014-11-28 ENCOUNTER — Ambulatory Visit (HOSPITAL_BASED_OUTPATIENT_CLINIC_OR_DEPARTMENT_OTHER): Payer: Medicaid Other

## 2014-11-28 ENCOUNTER — Ambulatory Visit (HOSPITAL_BASED_OUTPATIENT_CLINIC_OR_DEPARTMENT_OTHER): Payer: Medicaid Other | Admitting: Hematology and Oncology

## 2014-11-28 ENCOUNTER — Telehealth: Payer: Self-pay | Admitting: Hematology and Oncology

## 2014-11-28 VITALS — BP 168/106 | HR 74 | Temp 97.7°F | Resp 18 | Ht 65.0 in | Wt 185.1 lb

## 2014-11-28 DIAGNOSIS — C50411 Malignant neoplasm of upper-outer quadrant of right female breast: Secondary | ICD-10-CM

## 2014-11-28 DIAGNOSIS — I10 Essential (primary) hypertension: Secondary | ICD-10-CM

## 2014-11-28 DIAGNOSIS — Z170 Estrogen receptor positive status [ER+]: Secondary | ICD-10-CM | POA: Diagnosis not present

## 2014-11-28 DIAGNOSIS — Z5111 Encounter for antineoplastic chemotherapy: Secondary | ICD-10-CM | POA: Diagnosis present

## 2014-11-28 DIAGNOSIS — R05 Cough: Secondary | ICD-10-CM | POA: Diagnosis not present

## 2014-11-28 DIAGNOSIS — D508 Other iron deficiency anemias: Secondary | ICD-10-CM | POA: Diagnosis not present

## 2014-11-28 LAB — CBC WITH DIFFERENTIAL/PLATELET
BASO%: 1.8 % (ref 0.0–2.0)
BASOS ABS: 0.1 10*3/uL (ref 0.0–0.1)
EOS ABS: 0.1 10*3/uL (ref 0.0–0.5)
EOS%: 2.6 % (ref 0.0–7.0)
HEMATOCRIT: 32.2 % — AB (ref 34.8–46.6)
HEMOGLOBIN: 10.4 g/dL — AB (ref 11.6–15.9)
LYMPH#: 0.8 10*3/uL — AB (ref 0.9–3.3)
LYMPH%: 20 % (ref 14.0–49.7)
MCH: 30 pg (ref 25.1–34.0)
MCHC: 32.3 g/dL (ref 31.5–36.0)
MCV: 92.8 fL (ref 79.5–101.0)
MONO#: 0.3 10*3/uL (ref 0.1–0.9)
MONO%: 6.7 % (ref 0.0–14.0)
NEUT#: 2.7 10*3/uL (ref 1.5–6.5)
NEUT%: 68.9 % (ref 38.4–76.8)
Platelets: 305 10*3/uL (ref 145–400)
RBC: 3.47 10*6/uL — ABNORMAL LOW (ref 3.70–5.45)
RDW: 16.1 % — AB (ref 11.2–14.5)
WBC: 3.9 10*3/uL (ref 3.9–10.3)

## 2014-11-28 LAB — COMPREHENSIVE METABOLIC PANEL (CC13)
ALT: 31 U/L (ref 0–55)
AST: 23 U/L (ref 5–34)
Albumin: 3.8 g/dL (ref 3.5–5.0)
Alkaline Phosphatase: 63 U/L (ref 40–150)
Anion Gap: 12 mEq/L — ABNORMAL HIGH (ref 3–11)
BILIRUBIN TOTAL: 0.39 mg/dL (ref 0.20–1.20)
BUN: 6.1 mg/dL — AB (ref 7.0–26.0)
CO2: 22 meq/L (ref 22–29)
CREATININE: 0.7 mg/dL (ref 0.6–1.1)
Calcium: 8.9 mg/dL (ref 8.4–10.4)
Chloride: 107 mEq/L (ref 98–109)
EGFR: >90 mL/min/{1.73_m2} (ref >90)
Glucose: 97 mg/dl (ref 70–140)
Potassium: 3.9 mEq/L (ref 3.5–5.1)
Sodium: 141 mEq/L (ref 136–145)
Total Protein: 6.5 g/dL (ref 6.4–8.3)

## 2014-11-28 MED ORDER — PALONOSETRON HCL INJECTION 0.25 MG/5ML
INTRAVENOUS | Status: AC
  Filled 2014-11-28: qty 5

## 2014-11-28 MED ORDER — SODIUM CHLORIDE 0.9 % IJ SOLN
10.0000 mL | INTRAMUSCULAR | Status: DC | PRN
  Administered 2014-11-28: 10 mL
  Filled 2014-11-28: qty 10

## 2014-11-28 MED ORDER — DIPHENHYDRAMINE HCL 50 MG/ML IJ SOLN
50.0000 mg | Freq: Once | INTRAMUSCULAR | Status: AC
  Administered 2014-11-28: 50 mg via INTRAVENOUS

## 2014-11-28 MED ORDER — PALONOSETRON HCL INJECTION 0.25 MG/5ML
0.2500 mg | Freq: Once | INTRAVENOUS | Status: AC
  Administered 2014-11-28: 0.25 mg via INTRAVENOUS

## 2014-11-28 MED ORDER — SODIUM CHLORIDE 0.9 % IV SOLN
Freq: Once | INTRAVENOUS | Status: AC
  Administered 2014-11-28: 12:00:00 via INTRAVENOUS

## 2014-11-28 MED ORDER — HEPARIN SOD (PORK) LOCK FLUSH 100 UNIT/ML IV SOLN
500.0000 [IU] | Freq: Once | INTRAVENOUS | Status: AC | PRN
  Administered 2014-11-28: 500 [IU]
  Filled 2014-11-28: qty 5

## 2014-11-28 MED ORDER — DIPHENHYDRAMINE HCL 50 MG/ML IJ SOLN
INTRAMUSCULAR | Status: AC
  Filled 2014-11-28: qty 1

## 2014-11-28 MED ORDER — AZITHROMYCIN 250 MG PO TABS: ORAL_TABLET | ORAL | Status: DC

## 2014-11-28 MED ORDER — DEXTROSE 5 % IV SOLN
65.0000 mg/m2 | Freq: Once | INTRAVENOUS | Status: AC
  Administered 2014-11-28: 132 mg via INTRAVENOUS
  Filled 2014-11-28: qty 22

## 2014-11-28 MED ORDER — FAMOTIDINE IN NACL 20-0.9 MG/50ML-% IV SOLN
20.0000 mg | Freq: Once | INTRAVENOUS | Status: AC
  Administered 2014-11-28: 20 mg via INTRAVENOUS

## 2014-11-28 MED ORDER — FAMOTIDINE IN NACL 20-0.9 MG/50ML-% IV SOLN
INTRAVENOUS | Status: AC
  Filled 2014-11-28: qty 50

## 2014-11-28 NOTE — Progress Notes (Signed)
Patient Care Team: No Pcp Per Patient as PCP - General (General Practice) Rolm Bookbinder, MD as Consulting Physician (General Surgery) Nicholas Lose, MD as Consulting Physician (Hematology and Oncology) Thea Silversmith, MD as Consulting Physician (Radiation Oncology) Holley Bouche, NP as Nurse Practitioner (Nurse Practitioner) Carola Frost, RN as Registered Nurse (Oncology)  DIAGNOSIS: Breast cancer of upper-outer quadrant of right female breast   Staging form: Breast, AJCC 7th Edition     Clinical stage from 07/26/2014: Stage IIB (T2, N1, M0) - Unsigned       Staging comments: Staged at breast conference on 12.16.15    SUMMARY OF ONCOLOGIC HISTORY:   Breast cancer of upper-outer quadrant of right female breast   07/13/2014 Mammogram indeterminate mass in the right breast by ultrasound measured 2 cm at 10:00 position   07/20/2014 Initial Diagnosis right breast biopsy: Invasive ductal carcinoma, 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, grade 3,ER/PR positive, HER-2 equivocal ratio 1.55, gene copy #4.35 (being repeated), Ki-67 40%   08/09/2014 -  Neo-Adjuvant Chemotherapy Dose dense Adriamycin and Cytoxan 4 followed by Taxol 12    Procedure Genetic testing revealed three variant's of unknown significance - MRE11A c.256G>C, PMS2 c.1004A>G, and RAD51C c.200A>G - but was otherwise normal, and did not reveal a deleterious mutation in these genes    CHIEF COMPLIANT: Taxol 9/12  INTERVAL HISTORY: Barbara Myers is a 39 year old lady with above-mentioned history of right-sided breast cancer history be neoadjuvant chemotherapy she had completed 4 cycles. Last Cytoxan is currently on weekly Taxol treatments. Today is week 9/12. She had profound congestion and postnasal drainage for the past 4 weeks. This is leading to gagging symptoms as well as cough with expectoration. She denies any fevers or chills. Her nails are discolored. Denies any neuropathy other than in  the thumb and great toe.  REVIEW OF SYSTEMS:   Constitutional: Denies fevers, chills or abnormal weight loss Eyes: Denies blurriness of vision Ears, nose, mouth, throat, and face: Sinus congestion postnasal drainage  Respiratory: Cough with expectoration Cardiovascular: Denies palpitation, chest discomfort or lower extremity swelling Gastrointestinal:  Denies nausea, heartburn or change in bowel habits Skin: Denies abnormal skin rashes Lymphatics: Denies new lymphadenopathy or easy bruising Neurological:Denies numbness, tingling or new weaknesses Behavioral/Psych: Mood is stable, no new changes   All other systems were reviewed with the patient and are negative.  I have reviewed the past medical history, past surgical history, social history and family history with the patient and they are unchanged from previous note.  ALLERGIES:  is allergic to tape.  MEDICATIONS:  Current Outpatient Prescriptions  Medication Sig Dispense Refill  . Atorvastatin Calcium (INVESTIGATIONAL ATORVASTATIN/PLACEBO) 40 MG tablet [facility] [id number] Take 1 tablet by mouth daily. Take 2 doses (these doses must be 12 hours apart) prior to first chemotherapy treatment. Then take 1 tablet daily by mouth with or without food. 180 tablet 0  . azithromycin (ZITHROMAX Z-PAK) 250 MG tablet Dose as on pack 6 each 0  . ferrous fumarate (HEMOCYTE - 106 MG FE) 325 (106 FE) MG TABS tablet Take 1 tablet by mouth daily.    Marland Kitchen lidocaine-prilocaine (EMLA) cream   3  . lisinopril (PRINIVIL,ZESTRIL) 10 MG tablet Take 1 tablet (10 mg total) by mouth daily. 30 tablet 2  . lisinopril (PRINIVIL,ZESTRIL) 5 MG tablet Take 1 tablet (5 mg total) by mouth daily. 30 tablet 3  . LORazepam (ATIVAN) 0.5 MG tablet Take 1 tablet (0.5 mg total) by mouth every  6 (six) hours as needed (Nausea or vomiting). 30 tablet 3  . ranitidine (ZANTAC) 300 MG tablet Take 1 tablet (300 mg total) by mouth at bedtime. 30 tablet 0  . venlafaxine XR (EFFEXOR-XR)  37.5 MG 24 hr capsule Take 1 capsule (37.5 mg total) by mouth daily with breakfast. 30 capsule 3   No current facility-administered medications for this visit.   Facility-Administered Medications Ordered in Other Visits  Medication Dose Route Frequency Provider Last Rate Last Dose  . heparin lock flush 100 unit/mL  500 Units Intracatheter Once PRN Nicholas Lose, MD      . PACLitaxel (TAXOL) 132 mg in dextrose 5 % 250 mL chemo infusion (</= 65m/m2)  65 mg/m2 (Treatment Plan Actual) Intravenous Once VNicholas Lose MD 272 mL/hr at 11/28/14 1204 132 mg at 11/28/14 1204  . sodium chloride 0.9 % injection 10 mL  10 mL Intracatheter PRN VNicholas Lose MD        PHYSICAL EXAMINATION: ECOG PERFORMANCE STATUS: 1 - Symptomatic but completely ambulatory  Filed Vitals:   11/28/14 0958  BP: 168/106  Pulse: 74  Temp: 97.7 F (36.5 C)  Resp: 18   Filed Weights   11/28/14 0958  Weight: 185 lb 1.6 oz (83.961 kg)    GENERAL:alert, no distress and comfortable SKIN: skin color, texture, turgor are normal, no rashes or significant lesions EYES: normal, Conjunctiva are pink and non-injected, sclera clear OROPHARYNX:no exudate, no erythema and lips, buccal mucosa, and tongue normal  NECK: supple, thyroid normal size, non-tender, without nodularity LYMPH:  no palpable lymphadenopathy in the cervical, axillary or inguinal LUNGS: clear to auscultation and percussion with normal breathing effort HEART: regular rate & rhythm and no murmurs and no lower extremity edema ABDOMEN:abdomen soft, non-tender and normal bowel sounds Musculoskeletal:no cyanosis of digits and no clubbing  NEURO: alert & oriented x 3 with fluent speech, no focal motor/sensory deficits   LABORATORY DATA:  I have reviewed the data as listed   Chemistry      Component Value Date/Time   NA 141 11/28/2014 0945   NA 138 01/22/2011 2330   K 3.9 11/28/2014 0945   K 3.6 01/22/2011 2330   CL 104 01/22/2011 2330   CO2 22 11/28/2014 0945    CO2 28 01/22/2011 2330   BUN 6.1* 11/28/2014 0945   BUN 9 01/22/2011 2330   CREATININE 0.7 11/28/2014 0945   CREATININE 0.72 01/22/2011 2330      Component Value Date/Time   CALCIUM 8.9 11/28/2014 0945   CALCIUM 9.2 01/22/2011 2330   ALKPHOS 63 11/28/2014 0945   ALKPHOS 55 01/22/2011 2330   AST 23 11/28/2014 0945   AST 21 01/22/2011 2330   ALT 31 11/28/2014 0945   ALT 15 01/22/2011 2330   BILITOT 0.39 11/28/2014 0945   BILITOT 0.2* 01/22/2011 2330       Lab Results  Component Value Date   WBC 3.9 11/28/2014   HGB 10.4* 11/28/2014   HCT 32.2* 11/28/2014   MCV 92.8 11/28/2014   PLT 305 11/28/2014   NEUTROABS 2.7 11/28/2014    ASSESSMENT & PLAN:  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  Toxicities to chemotherapy 1. Acid reflux: Patient is taking Zantac and we decreased the dosage of Decadron.  2. Chest pressure: Probably related to #1: Resolved with Zantac 3. Alopecia 4. Loss of taste 5. Hot flashes at night: Stopped gabapentin and is now on Effexor XR 6. Chemo induced anemia grade 2 hemoglobin stable  7. Hypertension: Prescribed her Lisinopril 5 mg daily. Will monitor it. 8. Nausea on Taxol 9. Itching sensation of hands and feet related to Taxol: Recommended Benadryl oral pills 10 fatigue related to chemotherapy: Decrease the dosage of Taxol to 65 mg meter square.  11. Cough congestion and sinus drainage: I prescribe her antibiotic with azithromycin 11/28/2014  Monitoring her closely for toxicities to chemotherapy. If her neuropathy becomes more persistent, we will switch  her from Taxol to Abraxane. Her blood counts were reviewed and she has adequate for her chemotherapy.  Iron deficiency anemia:Off oral iron.She is also receiving Zoladex injections to stop her menstrual cycles which have been very heavy previously. Iron studies revealed 27% saturation with a ferritin of 115. Hemoglobin electrophoresis did not reveal any hemoglobinopathies. Hypertension: Increased lisinopril to 20 mg daily   Return to clinic in 2 weeks and weekly for Taxol treatments    No orders of the defined types were placed in this encounter.   The patient has a good understanding of the overall plan. she agrees with it. She will call with any problems that may develop before her next visit here.   Rulon Eisenmenger, MD

## 2014-11-28 NOTE — Patient Instructions (Signed)
Paclitaxel injection What is this medicine? PACLITAXEL (PAK li TAX el) is a chemotherapy drug. It targets fast dividing cells, like cancer cells, and causes these cells to die. This medicine is used to treat ovarian cancer, breast cancer, and other cancers. This medicine may be used for other purposes; ask your health care provider or pharmacist if you have questions. COMMON BRAND NAME(S): Onxol, Taxol What should I tell my health care provider before I take this medicine? They need to know if you have any of these conditions: -blood disorders -irregular heartbeat -infection (especially a virus infection such as chickenpox, cold sores, or herpes) -liver disease -previous or ongoing radiation therapy -an unusual or allergic reaction to paclitaxel, alcohol, polyoxyethylated castor oil, other chemotherapy agents, other medicines, foods, dyes, or preservatives -pregnant or trying to get pregnant -breast-feeding How should I use this medicine? This drug is given as an infusion into a vein. It is administered in a hospital or clinic by a specially trained health care professional. Talk to your pediatrician regarding the use of this medicine in children. Special care may be needed. Overdosage: If you think you have taken too much of this medicine contact a poison control center or emergency room at once. NOTE: This medicine is only for you. Do not share this medicine with others. What if I miss a dose? It is important not to miss your dose. Call your doctor or health care professional if you are unable to keep an appointment. What may interact with this medicine? Do not take this medicine with any of the following medications: -disulfiram -metronidazole This medicine may also interact with the following medications: -cyclosporine -diazepam -ketoconazole -medicines to increase blood counts like filgrastim, pegfilgrastim, sargramostim -other chemotherapy drugs like cisplatin, doxorubicin,  epirubicin, etoposide, teniposide, vincristine -quinidine -testosterone -vaccines -verapamil Talk to your doctor or health care professional before taking any of these medicines: -acetaminophen -aspirin -ibuprofen -ketoprofen -naproxen This list may not describe all possible interactions. Give your health care provider a list of all the medicines, herbs, non-prescription drugs, or dietary supplements you use. Also tell them if you smoke, drink alcohol, or use illegal drugs. Some items may interact with your medicine. What should I watch for while using this medicine? Your condition will be monitored carefully while you are receiving this medicine. You will need important blood work done while you are taking this medicine. This drug may make you feel generally unwell. This is not uncommon, as chemotherapy can affect healthy cells as well as cancer cells. Report any side effects. Continue your course of treatment even though you feel ill unless your doctor tells you to stop. In some cases, you may be given additional medicines to help with side effects. Follow all directions for their use. Call your doctor or health care professional for advice if you get a fever, chills or sore throat, or other symptoms of a cold or flu. Do not treat yourself. This drug decreases your body's ability to fight infections. Try to avoid being around people who are sick. This medicine may increase your risk to bruise or bleed. Call your doctor or health care professional if you notice any unusual bleeding. Be careful brushing and flossing your teeth or using a toothpick because you may get an infection or bleed more easily. If you have any dental work done, tell your dentist you are receiving this medicine. Avoid taking products that contain aspirin, acetaminophen, ibuprofen, naproxen, or ketoprofen unless instructed by your doctor. These medicines may hide a fever.   Do not become pregnant while taking this medicine.  Women should inform their doctor if they wish to become pregnant or think they might be pregnant. There is a potential for serious side effects to an unborn child. Talk to your health care professional or pharmacist for more information. Do not breast-feed an infant while taking this medicine. Men are advised not to father a child while receiving this medicine. What side effects may I notice from receiving this medicine? Side effects that you should report to your doctor or health care professional as soon as possible: -allergic reactions like skin rash, itching or hives, swelling of the face, lips, or tongue -low blood counts - This drug may decrease the number of white blood cells, red blood cells and platelets. You may be at increased risk for infections and bleeding. -signs of infection - fever or chills, cough, sore throat, pain or difficulty passing urine -signs of decreased platelets or bleeding - bruising, pinpoint red spots on the skin, black, tarry stools, nosebleeds -signs of decreased red blood cells - unusually weak or tired, fainting spells, lightheadedness -breathing problems -chest pain -high or low blood pressure -mouth sores -nausea and vomiting -pain, swelling, redness or irritation at the injection site -pain, tingling, numbness in the hands or feet -slow or irregular heartbeat -swelling of the ankle, feet, hands Side effects that usually do not require medical attention (report to your doctor or health care professional if they continue or are bothersome): -bone pain -complete hair loss including hair on your head, underarms, pubic hair, eyebrows, and eyelashes -changes in the color of fingernails -diarrhea -loosening of the fingernails -loss of appetite -muscle or joint pain -red flush to skin -sweating This list may not describe all possible side effects. Call your doctor for medical advice about side effects. You may report side effects to FDA at  1-800-FDA-1088. Where should I keep my medicine? This drug is given in a hospital or clinic and will not be stored at home. NOTE: This sheet is a summary. It may not cover all possible information. If you have questions about this medicine, talk to your doctor, pharmacist, or health care provider.  2015, Elsevier/Gold Standard. (2012-09-20 16:41:21)  

## 2014-11-28 NOTE — Assessment & Plan Note (Signed)
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  Toxicities to chemotherapy 1. Acid reflux: Patient is taking Zantac and we decreased the dosage of Decadron.  2. Chest pressure: Probably related to #1: Resolved with Zantac 3. Alopecia 4. Loss of taste 5. Hot flashes at night: Stopped gabapentin and is now on Effexor XR 6. Chemo induced anemia grade 2 hemoglobin stable  7. Hypertension: Prescribed her Lisinopril 5 mg daily. Will monitor it. 8. Nausea on Taxol 9. Itching sensation of hands and feet related to Taxol: Recommended Benadryl oral pills 10 fatigue related to chemotherapy: Decrease the dosage of Taxol to 65 mg meter square.   Monitoring her closely for toxicities to chemotherapy. 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 Her blood counts were reviewed and she has adequate for her chemotherapy.  Iron deficiency anemia:Off oral iron.She is also receiving Zoladex injections to stop her menstrual cycles which have been very heavy previously. Iron studies revealed 27% saturation with a ferritin of 115. Hemoglobin electrophoresis did not reveal any hemoglobinopathies. Hypertension:On lisinopril 10 mg daily   Return to clinic in 2 weeks and weekly for Taxol treatments

## 2014-11-28 NOTE — Telephone Encounter (Signed)
Patient is already on schedule for 5/3

## 2014-12-05 ENCOUNTER — Ambulatory Visit (HOSPITAL_BASED_OUTPATIENT_CLINIC_OR_DEPARTMENT_OTHER): Payer: Medicaid Other

## 2014-12-05 ENCOUNTER — Other Ambulatory Visit (HOSPITAL_BASED_OUTPATIENT_CLINIC_OR_DEPARTMENT_OTHER): Payer: Medicaid Other

## 2014-12-05 VITALS — BP 147/99 | HR 72 | Temp 98.6°F | Resp 18

## 2014-12-05 DIAGNOSIS — C50411 Malignant neoplasm of upper-outer quadrant of right female breast: Secondary | ICD-10-CM

## 2014-12-05 DIAGNOSIS — Z5111 Encounter for antineoplastic chemotherapy: Secondary | ICD-10-CM | POA: Diagnosis present

## 2014-12-05 DIAGNOSIS — C773 Secondary and unspecified malignant neoplasm of axilla and upper limb lymph nodes: Secondary | ICD-10-CM

## 2014-12-05 LAB — CBC WITH DIFFERENTIAL/PLATELET
BASO%: 0.7 % (ref 0.0–2.0)
BASOS ABS: 0 10*3/uL (ref 0.0–0.1)
EOS%: 2.2 % (ref 0.0–7.0)
Eosinophils Absolute: 0.1 10*3/uL (ref 0.0–0.5)
HEMATOCRIT: 32.8 % — AB (ref 34.8–46.6)
HGB: 10.9 g/dL — ABNORMAL LOW (ref 11.6–15.9)
LYMPH%: 25.1 % (ref 14.0–49.7)
MCH: 30.8 pg (ref 25.1–34.0)
MCHC: 33.2 g/dL (ref 31.5–36.0)
MCV: 92.7 fL (ref 79.5–101.0)
MONO#: 0.2 10*3/uL (ref 0.1–0.9)
MONO%: 5.5 % (ref 0.0–14.0)
NEUT%: 66.5 % (ref 38.4–76.8)
NEUTROS ABS: 2.8 10*3/uL (ref 1.5–6.5)
PLATELETS: 300 10*3/uL (ref 145–400)
RBC: 3.54 10*6/uL — ABNORMAL LOW (ref 3.70–5.45)
RDW: 14.9 % — ABNORMAL HIGH (ref 11.2–14.5)
WBC: 4.2 10*3/uL (ref 3.9–10.3)
lymph#: 1 10*3/uL (ref 0.9–3.3)

## 2014-12-05 LAB — COMPREHENSIVE METABOLIC PANEL (CC13)
ALT: 26 U/L (ref 0–55)
AST: 20 U/L (ref 5–34)
Albumin: 3.9 g/dL (ref 3.5–5.0)
Alkaline Phosphatase: 62 U/L (ref 40–150)
Anion Gap: 13 mEq/L — ABNORMAL HIGH (ref 3–11)
BILIRUBIN TOTAL: 0.49 mg/dL (ref 0.20–1.20)
BUN: 8.6 mg/dL (ref 7.0–26.0)
CO2: 23 meq/L (ref 22–29)
CREATININE: 0.8 mg/dL (ref 0.6–1.1)
Calcium: 9.3 mg/dL (ref 8.4–10.4)
Chloride: 108 mEq/L (ref 98–109)
EGFR: >90 mL/min/{1.73_m2} (ref >90)
GLUCOSE: 93 mg/dL (ref 70–140)
Potassium: 4 mEq/L (ref 3.5–5.1)
Sodium: 143 mEq/L (ref 136–145)
TOTAL PROTEIN: 6.6 g/dL (ref 6.4–8.3)

## 2014-12-05 MED ORDER — PALONOSETRON HCL INJECTION 0.25 MG/5ML
INTRAVENOUS | Status: AC
  Filled 2014-12-05: qty 5

## 2014-12-05 MED ORDER — PALONOSETRON HCL INJECTION 0.25 MG/5ML
0.2500 mg | Freq: Once | INTRAVENOUS | Status: AC
  Administered 2014-12-05: 0.25 mg via INTRAVENOUS

## 2014-12-05 MED ORDER — FAMOTIDINE IN NACL 20-0.9 MG/50ML-% IV SOLN
INTRAVENOUS | Status: AC
  Filled 2014-12-05: qty 50

## 2014-12-05 MED ORDER — HEPARIN SOD (PORK) LOCK FLUSH 100 UNIT/ML IV SOLN
500.0000 [IU] | Freq: Once | INTRAVENOUS | Status: AC | PRN
  Administered 2014-12-05: 500 [IU]
  Filled 2014-12-05: qty 5

## 2014-12-05 MED ORDER — SODIUM CHLORIDE 0.9 % IV SOLN
Freq: Once | INTRAVENOUS | Status: AC
  Administered 2014-12-05: 11:00:00 via INTRAVENOUS

## 2014-12-05 MED ORDER — DIPHENHYDRAMINE HCL 50 MG/ML IJ SOLN
50.0000 mg | Freq: Once | INTRAMUSCULAR | Status: AC
  Administered 2014-12-05: 50 mg via INTRAVENOUS

## 2014-12-05 MED ORDER — PACLITAXEL CHEMO INJECTION 300 MG/50ML
65.0000 mg/m2 | Freq: Once | INTRAVENOUS | Status: AC
  Administered 2014-12-05: 132 mg via INTRAVENOUS
  Filled 2014-12-05: qty 22

## 2014-12-05 MED ORDER — SODIUM CHLORIDE 0.9 % IJ SOLN
10.0000 mL | INTRAMUSCULAR | Status: DC | PRN
  Administered 2014-12-05: 10 mL
  Filled 2014-12-05: qty 10

## 2014-12-05 MED ORDER — FAMOTIDINE IN NACL 20-0.9 MG/50ML-% IV SOLN
20.0000 mg | Freq: Once | INTRAVENOUS | Status: AC
  Administered 2014-12-05: 20 mg via INTRAVENOUS

## 2014-12-05 MED ORDER — DIPHENHYDRAMINE HCL 50 MG/ML IJ SOLN
INTRAMUSCULAR | Status: AC
  Filled 2014-12-05: qty 1

## 2014-12-05 NOTE — Patient Instructions (Signed)
[facility] Discharge Instructions for Patients Receiving Chemotherapy  Today you received the following chemotherapy agents: taxol  To help prevent nausea and vomiting after your treatment, we encourage you to take your nausea medication.  Take it as often as prescribed.     If you develop nausea and vomiting that is not controlled by your nausea medication, call the clinic. If it is after clinic hours your family physician or the after hours number for the clinic or go to the Emergency Department.   BELOW ARE SYMPTOMS THAT SHOULD BE REPORTED IMMEDIATELY:  *FEVER GREATER THAN 100.5 F  *CHILLS WITH OR WITHOUT FEVER  NAUSEA AND VOMITING THAT IS NOT CONTROLLED WITH YOUR NAUSEA MEDICATION  *UNUSUAL SHORTNESS OF BREATH  *UNUSUAL BRUISING OR BLEEDING  TENDERNESS IN MOUTH AND THROAT WITH OR WITHOUT PRESENCE OF ULCERS  *URINARY PROBLEMS  *BOWEL PROBLEMS  UNUSUAL RASH Items with * indicate a potential emergency and should be followed up as soon as possible.  Feel free to call the clinic you have any questions or concerns. The clinic phone number is [phone].   I have been informed and understand all the instructions given to me. I know to contact the clinic, my physician, or go to the Emergency Department if any problems should occur. I do not have any questions at this time, but understand that I may call the clinic during office hours   should I have any questions or need assistance in obtaining follow up care.    __________________________________________  _____________  __________ Signature of Patient or Authorized Representative            Date                   Time    __________________________________________ Nurse's Signature    

## 2014-12-11 NOTE — Assessment & Plan Note (Signed)
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  Toxicities to chemotherapy 1. Acid reflux: Patient is taking Zantac and we decreased the dosage of Decadron.  2. Chest pressure: Probably related to #1: Resolved with Zantac 3. Alopecia 4. Loss of taste 5. Hot flashes at night: Stopped gabapentin and is now on Effexor XR 6. Chemo induced anemia grade 2 hemoglobin stable  7. Hypertension: Prescribed her Lisinopril 5 mg daily. Will monitor it. 8. Nausea on Taxol 9. Itching sensation of hands and feet related to Taxol: Recommended Benadryl oral pills 10 fatigue related to chemotherapy: Decrease the dosage of Taxol to 65 mg meter square.  11. Cough congestion and sinus drainage: I prescribe her antibiotic with azithromycin 11/28/2014  Monitoring her closely for toxicities to chemotherapy. 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 Her blood counts were reviewed and she has adequate for her chemotherapy.  Iron deficiency anemia:Off oral iron.She is also receiving Zoladex injections to stop her menstrual cycles which have been very heavy previously. Iron studies revealed 27% saturation with a ferritin of 115. Hemoglobin electrophoresis did not reveal any hemoglobinopathies. Hypertension: Increased lisinopril to 20 mg daily   Plan: 1. MRI Breast 2. Tumor Board presentation 3. Surgery

## 2014-12-12 ENCOUNTER — Ambulatory Visit: Payer: No Typology Code available for payment source

## 2014-12-12 ENCOUNTER — Telehealth: Payer: Self-pay | Admitting: Hematology and Oncology

## 2014-12-12 ENCOUNTER — Other Ambulatory Visit (HOSPITAL_BASED_OUTPATIENT_CLINIC_OR_DEPARTMENT_OTHER): Payer: Medicaid Other

## 2014-12-12 ENCOUNTER — Ambulatory Visit (HOSPITAL_BASED_OUTPATIENT_CLINIC_OR_DEPARTMENT_OTHER): Payer: Medicaid Other | Admitting: Nurse Practitioner

## 2014-12-12 ENCOUNTER — Ambulatory Visit (HOSPITAL_BASED_OUTPATIENT_CLINIC_OR_DEPARTMENT_OTHER): Payer: Medicaid Other

## 2014-12-12 ENCOUNTER — Ambulatory Visit (HOSPITAL_BASED_OUTPATIENT_CLINIC_OR_DEPARTMENT_OTHER): Payer: Medicaid Other | Admitting: Hematology and Oncology

## 2014-12-12 ENCOUNTER — Other Ambulatory Visit: Payer: Self-pay | Admitting: Nurse Practitioner

## 2014-12-12 VITALS — BP 149/99 | HR 86 | Temp 98.2°F | Resp 19 | Ht 65.0 in | Wt 182.4 lb

## 2014-12-12 DIAGNOSIS — R05 Cough: Secondary | ICD-10-CM

## 2014-12-12 DIAGNOSIS — L299 Pruritus, unspecified: Secondary | ICD-10-CM

## 2014-12-12 DIAGNOSIS — C50411 Malignant neoplasm of upper-outer quadrant of right female breast: Secondary | ICD-10-CM

## 2014-12-12 DIAGNOSIS — R110 Nausea: Secondary | ICD-10-CM | POA: Diagnosis not present

## 2014-12-12 DIAGNOSIS — C773 Secondary and unspecified malignant neoplasm of axilla and upper limb lymph nodes: Secondary | ICD-10-CM

## 2014-12-12 DIAGNOSIS — D509 Iron deficiency anemia, unspecified: Secondary | ICD-10-CM

## 2014-12-12 DIAGNOSIS — D6481 Anemia due to antineoplastic chemotherapy: Secondary | ICD-10-CM

## 2014-12-12 DIAGNOSIS — R530 Neoplastic (malignant) related fatigue: Secondary | ICD-10-CM

## 2014-12-12 DIAGNOSIS — Z5111 Encounter for antineoplastic chemotherapy: Secondary | ICD-10-CM

## 2014-12-12 DIAGNOSIS — I10 Essential (primary) hypertension: Secondary | ICD-10-CM | POA: Diagnosis not present

## 2014-12-12 LAB — CBC WITH DIFFERENTIAL/PLATELET
BASO%: 1.4 % (ref 0.0–2.0)
Basophils Absolute: 0.1 10*3/uL (ref 0.0–0.1)
EOS ABS: 0.1 10*3/uL (ref 0.0–0.5)
EOS%: 2.3 % (ref 0.0–7.0)
HCT: 32.1 % — ABNORMAL LOW (ref 34.8–46.6)
HGB: 10.4 g/dL — ABNORMAL LOW (ref 11.6–15.9)
LYMPH%: 25.4 % (ref 14.0–49.7)
MCH: 29.8 pg (ref 25.1–34.0)
MCHC: 32.3 g/dL (ref 31.5–36.0)
MCV: 92.2 fL (ref 79.5–101.0)
MONO#: 0.2 10*3/uL (ref 0.1–0.9)
MONO%: 5.3 % (ref 0.0–14.0)
NEUT%: 65.6 % (ref 38.4–76.8)
NEUTROS ABS: 2.4 10*3/uL (ref 1.5–6.5)
Platelets: 298 10*3/uL (ref 145–400)
RBC: 3.48 10*6/uL — AB (ref 3.70–5.45)
RDW: 15.6 % — AB (ref 11.2–14.5)
WBC: 3.7 10*3/uL — ABNORMAL LOW (ref 3.9–10.3)
lymph#: 0.9 10*3/uL (ref 0.9–3.3)

## 2014-12-12 LAB — COMPREHENSIVE METABOLIC PANEL (CC13)
ALT: 29 U/L (ref 0–55)
ANION GAP: 10 meq/L (ref 3–11)
AST: 22 U/L (ref 5–34)
Albumin: 3.8 g/dL (ref 3.5–5.0)
Alkaline Phosphatase: 64 U/L (ref 40–150)
BILIRUBIN TOTAL: 0.43 mg/dL (ref 0.20–1.20)
BUN: 6.7 mg/dL — ABNORMAL LOW (ref 7.0–26.0)
CALCIUM: 9.2 mg/dL (ref 8.4–10.4)
CHLORIDE: 106 meq/L (ref 98–109)
CO2: 26 meq/L (ref 22–29)
CREATININE: 0.7 mg/dL (ref 0.6–1.1)
EGFR: >90 mL/min/{1.73_m2} (ref >90)
GLUCOSE: 98 mg/dL (ref 70–140)
Potassium: 3.6 mEq/L (ref 3.5–5.1)
Sodium: 142 mEq/L (ref 136–145)
Total Protein: 6.4 g/dL (ref 6.4–8.3)

## 2014-12-12 MED ORDER — PALONOSETRON HCL INJECTION 0.25 MG/5ML
0.2500 mg | Freq: Once | INTRAVENOUS | Status: AC
  Administered 2014-12-12: 0.25 mg via INTRAVENOUS

## 2014-12-12 MED ORDER — METOPROLOL TARTRATE 50 MG PO TABS: 50.0000 mg | ORAL_TABLET | Freq: Once | ORAL | Status: DC

## 2014-12-12 MED ORDER — METHYLPREDNISOLONE SODIUM SUCC 125 MG IJ SOLR
INTRAMUSCULAR | Status: AC
  Filled 2014-12-12: qty 2

## 2014-12-12 MED ORDER — HEPARIN SOD (PORK) LOCK FLUSH 100 UNIT/ML IV SOLN
500.0000 [IU] | Freq: Once | INTRAVENOUS | Status: AC | PRN
  Administered 2014-12-12: 500 [IU]
  Filled 2014-12-12: qty 5

## 2014-12-12 MED ORDER — SODIUM CHLORIDE 0.9 % IJ SOLN
10.0000 mL | INTRAMUSCULAR | Status: DC | PRN
  Administered 2014-12-12: 10 mL
  Filled 2014-12-12: qty 10

## 2014-12-12 MED ORDER — FAMOTIDINE IN NACL 20-0.9 MG/50ML-% IV SOLN
INTRAVENOUS | Status: AC
  Filled 2014-12-12: qty 50

## 2014-12-12 MED ORDER — PACLITAXEL CHEMO INJECTION 300 MG/50ML
65.0000 mg/m2 | Freq: Once | INTRAVENOUS | Status: AC
  Administered 2014-12-12: 132 mg via INTRAVENOUS
  Filled 2014-12-12: qty 22

## 2014-12-12 MED ORDER — DIPHENHYDRAMINE HCL 50 MG/ML IJ SOLN
50.0000 mg | Freq: Once | INTRAMUSCULAR | Status: AC
  Administered 2014-12-12: 50 mg via INTRAVENOUS

## 2014-12-12 MED ORDER — METHYLPREDNISOLONE SODIUM SUCC 125 MG IJ SOLR
125.0000 mg | Freq: Once | INTRAMUSCULAR | Status: AC
  Administered 2014-12-12: 125 mg via INTRAVENOUS

## 2014-12-12 MED ORDER — PALONOSETRON HCL INJECTION 0.25 MG/5ML
INTRAVENOUS | Status: AC
  Filled 2014-12-12: qty 5

## 2014-12-12 MED ORDER — SODIUM CHLORIDE 0.9 % IV SOLN
Freq: Once | INTRAVENOUS | Status: AC
  Administered 2014-12-12: 11:00:00 via INTRAVENOUS

## 2014-12-12 MED ORDER — FAMOTIDINE IN NACL 20-0.9 MG/50ML-% IV SOLN
20.0000 mg | Freq: Once | INTRAVENOUS | Status: AC
  Administered 2014-12-12: 20 mg via INTRAVENOUS

## 2014-12-12 MED ORDER — DIPHENHYDRAMINE HCL 50 MG/ML IJ SOLN
INTRAMUSCULAR | Status: AC
  Filled 2014-12-12: qty 1

## 2014-12-12 NOTE — Telephone Encounter (Signed)
Appointments made and avs printed for patient °

## 2014-12-12 NOTE — Progress Notes (Signed)
Patient Care Team: No Pcp Per Patient as PCP - General (General Practice) Rolm Bookbinder, MD as Consulting Physician (General Surgery) Nicholas Lose, MD as Consulting Physician (Hematology and Oncology) Thea Silversmith, MD as Consulting Physician (Radiation Oncology) Holley Bouche, NP as Nurse Practitioner (Nurse Practitioner) Carola Frost, RN as Registered Nurse (Oncology)  DIAGNOSIS: Breast cancer of upper-outer quadrant of right female breast   Staging form: Breast, AJCC 7th Edition     Clinical stage from 07/26/2014: Stage IIB (T2, N1, M0) - Unsigned       Staging comments: Staged at breast conference on 12.16.15    SUMMARY OF ONCOLOGIC HISTORY:   Breast cancer of upper-outer quadrant of right female breast   07/13/2014 Mammogram indeterminate mass in the right breast by ultrasound measured 2 cm at 10:00 position   07/20/2014 Initial Diagnosis right breast biopsy: Invasive ductal carcinoma, 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 She is here to receive week 11 of Taxol. She does not have neuropathy. Denies any nausea or vomiting. Taste is coming back as well as her hair which is gray in color. Complains of dry cough that has not responded to azithromycin antibiotic. I suspect it could be coming from lisinopril.  REVIEW OF SYSTEMS:   Constitutional: Denies fevers,  chills or abnormal weight loss Eyes: Denies blurriness of vision Ears, nose, mouth, throat, and face: Denies mucositis or sore throat Respiratory: Dry cough Cardiovascular: Denies palpitation, chest discomfort or lower extremity swelling Gastrointestinal:  Denies nausea, heartburn or change in bowel habits Skin: Denies abnormal skin rashes Lymphatics: Denies new lymphadenopathy or easy bruising Neurological:Denies numbness, tingling or new weaknesses Behavioral/Psych: Mood is stable, no new changes  Breast:  denies any pain or lumps or nodules in either breasts All other systems were reviewed with the patient and are negative.  I have reviewed the past medical history, past surgical history, social history and family history with the patient and they are unchanged from previous note.  ALLERGIES:  is allergic to tape.  MEDICATIONS:  Current Outpatient Prescriptions  Medication Sig Dispense Refill  . Atorvastatin Calcium (INVESTIGATIONAL ATORVASTATIN/PLACEBO) 40 MG tablet [facility] [id number] Take 1 tablet by mouth daily. Take 2 doses (these doses must be 12 hours apart) prior to first chemotherapy treatment. Then take 1 tablet daily by mouth with or without food. 180 tablet 0  . ferrous fumarate (HEMOCYTE - 106 MG FE) 325 (106 FE) MG TABS tablet Take 1 tablet by mouth daily.    Marland Kitchen lidocaine-prilocaine (EMLA) cream   3  . LORazepam (ATIVAN) 0.5 MG tablet Take 1 tablet (0.5 mg total) by mouth every 6 (six) hours as needed (Nausea or vomiting). 30 tablet 3  . ranitidine (ZANTAC) 300 MG tablet Take 1 tablet (300 mg total) by mouth at bedtime. 30 tablet 0  . venlafaxine XR (EFFEXOR-XR) 37.5 MG 24 hr capsule Take 1 capsule (37.5 mg total) by mouth daily with breakfast. 30  capsule 3  . metoprolol (LOPRESSOR) 50 MG tablet Take 1 tablet (50 mg total) by mouth once. 30 tablet 3   No current facility-administered medications for this visit.   Facility-Administered Medications Ordered in Other Visits   Medication Dose Route Frequency Provider Last Rate Last Dose  . heparin lock flush 100 unit/mL  500 Units Intracatheter Once PRN Nicholas Lose, MD      . PACLitaxel (TAXOL) 132 mg in dextrose 5 % 250 mL chemo infusion (</= 44m/m2)  65 mg/m2 (Treatment Plan Actual) Intravenous Once VNicholas Lose MD      . sodium chloride 0.9 % injection 10 mL  10 mL Intracatheter PRN VNicholas Lose MD        PHYSICAL EXAMINATION: ECOG PERFORMANCE STATUS: 1 - Symptomatic but completely ambulatory  Filed Vitals:   12/12/14 1003  BP: 149/99  Pulse: 86  Temp: 98.2 F (36.8 C)  Resp: 19   Filed Weights   12/12/14 1003  Weight: 182 lb 6.4 oz (82.736 kg)    GENERAL:alert, no distress and comfortable SKIN: skin color, texture, turgor are normal, no rashes or significant lesions EYES: normal, Conjunctiva are pink and non-injected, sclera clear OROPHARYNX:no exudate, no erythema and lips, buccal mucosa, and tongue normal  NECK: supple, thyroid normal size, non-tender, without nodularity LYMPH:  no palpable lymphadenopathy in the cervical, axillary or inguinal LUNGS: clear to auscultation and percussion with normal breathing effort HEART: regular rate & rhythm and no murmurs and no lower extremity edema ABDOMEN:abdomen soft, non-tender and normal bowel sounds Musculoskeletal:no cyanosis of digits and no clubbing  NEURO: alert & oriented x 3 with fluent speech, no focal motor/sensory deficits  LABORATORY DATA:  I have reviewed the data as listed   Chemistry      Component Value Date/Time   NA 142 12/12/2014 0945   NA 138 01/22/2011 2330   K 3.6 12/12/2014 0945   K 3.6 01/22/2011 2330   CL 104 01/22/2011 2330   CO2 26 12/12/2014 0945   CO2 28 01/22/2011 2330   BUN 6.7* 12/12/2014 0945   BUN 9 01/22/2011 2330   CREATININE 0.7 12/12/2014 0945   CREATININE 0.72 01/22/2011 2330      Component Value Date/Time   CALCIUM 9.2 12/12/2014 0945   CALCIUM 9.2 01/22/2011 2330   ALKPHOS 64 12/12/2014 0945    ALKPHOS 55 01/22/2011 2330   AST 22 12/12/2014 0945   AST 21 01/22/2011 2330   ALT 29 12/12/2014 0945   ALT 15 01/22/2011 2330   BILITOT 0.43 12/12/2014 0945   BILITOT 0.2* 01/22/2011 2330       Lab Results  Component Value Date   WBC 3.7* 12/12/2014   HGB 10.4* 12/12/2014   HCT 32.1* 12/12/2014   MCV 92.2 12/12/2014   PLT 298 12/12/2014   NEUTROABS 2.4 12/12/2014   ASSESSMENT & PLAN:  Breast cancer of upper-outer quadrant of right female breast 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  Toxicities to chemotherapy 1. Acid reflux: Patient is taking Zantac and we decreased the dosage of Decadron.  2. Chest pressure: Probably related to #1: Resolved with Zantac 3. Alopecia 4. Loss of taste 5. Hot flashes at night:  Stopped gabapentin and is now on Effexor XR 6. Chemo induced anemia grade 2 hemoglobin stable  7. Hypertension: Prescribed her Lisinopril 5 mg daily. Will monitor it. 8. Nausea on Taxol 9. Itching sensation of hands and feet related to Taxol: Recommended Benadryl oral pills 10 fatigue related to chemotherapy: Decrease the dosage of Taxol to 65 mg meter square.  11. Cough congestion and sinus drainage: I prescribe her antibiotic with azithromycin 11/28/2014  Monitoring her closely for toxicities to chemotherapy. Her blood counts were reviewed and she has adequate for her chemotherapy.  Iron deficiency anemia:Off oral iron.She is also receiving Zoladex injections to stop her menstrual cycles which have been very heavy previously. Iron studies revealed 27% saturation with a ferritin of 115. Hemoglobin electrophoresis did not reveal any  hemoglobinopathies. Hypertension: Change to metoprolol 50 mg daily because she has a dry cough and it could be related to lisinopril.  Plan: 1. MRI Breast 2. Tumor Board presentation 3. Surgery     Orders Placed This Encounter  Procedures  . MR Breast Bilateral Wo Contrast    Standing Status: Future     Number of Occurrences:      Standing Expiration Date: 12/12/2015    Order Specific Question:  Reason for Exam (SYMPTOM  OR DIAGNOSIS REQUIRED)    Answer:  Post neoadjuvant chemo    Order Specific Question:  Preferred imaging location?    Answer:  [facility]    Order Specific Question:  Does the patient have a pacemaker or implanted devices?    Answer:  No    Order Specific Question:  What is the patient's sedation requirement?    Answer:  No Sedation  . MR Breast Bilateral W Contrast    Standing Status: Future     Number of Occurrences:      Standing Expiration Date: 12/12/2015    Order Specific Question:  Reason for Exam (SYMPTOM  OR DIAGNOSIS REQUIRED)    Answer:  Post neoadjuvant chemo    Order Specific Question:  Preferred imaging location?    Answer:  [facility]    Order Specific Question:  Does the patient have a pacemaker or implanted devices?    Answer:  No    Order Specific Question:  What is the patient's sedation requirement?    Answer:  No Sedation   The patient has a good understanding of the overall plan. she agrees with it. she will call with any problems that may develop before the next visit here.   Rulon Eisenmenger, MD

## 2014-12-12 NOTE — Patient Instructions (Signed)
[facility] Discharge Instructions for Patients Receiving Chemotherapy  Today you received the following chemotherapy agent: Taxol.  To help prevent nausea and vomiting after your treatment, we encourage you to take your nausea medication: Lorazepam 0.5 mg every 6 hours as needed.   If you develop nausea and vomiting that is not controlled by your nausea medication, call the clinic.   BELOW ARE SYMPTOMS THAT SHOULD BE REPORTED IMMEDIATELY:  *FEVER GREATER THAN 100.5 F  *CHILLS WITH OR WITHOUT FEVER  NAUSEA AND VOMITING THAT IS NOT CONTROLLED WITH YOUR NAUSEA MEDICATION  *UNUSUAL SHORTNESS OF BREATH  *UNUSUAL BRUISING OR BLEEDING  TENDERNESS IN MOUTH AND THROAT WITH OR WITHOUT PRESENCE OF ULCERS  *URINARY PROBLEMS  *BOWEL PROBLEMS  UNUSUAL RASH Items with * indicate a potential emergency and should be followed up as soon as possible.  Feel free to call the clinic you have any questions or concerns. The clinic phone number is [phone].  Please show the [facility] at check-in to the Emergency Department and triage nurse.

## 2014-12-12 NOTE — Progress Notes (Signed)
@   approx. 11:30 pt c/o new onset itching hands and wrists.  Had just completed last of pre-meds (Aloxi, Benadryl and pepcid). Had not started Taxol yet.  Notified Selena Lesser, NP of itching and orderedSolumedrol 125 mg IVP. This was given with sl;ight reduction in itching. Ok to start Taxol per Selena Lesser, NP. Will continue to monitor itching. @ 12:15 pm Selena Lesser, NP back to check on pt. Still itching but somewhat better. Given Aquaphor lotion samples and prescriptions for po benadryl and pepcid along with instructions once discharged from treatment room today. Pt. Voices understanding. @1300  Itching has resolved for the most part.  Tolerated Taxol w/o issues.

## 2014-12-15 ENCOUNTER — Encounter: Payer: Self-pay | Admitting: Nurse Practitioner

## 2014-12-15 DIAGNOSIS — L299 Pruritus, unspecified: Secondary | ICD-10-CM | POA: Insufficient documentation

## 2014-12-15 NOTE — Assessment & Plan Note (Signed)
Patient presented to the [facility] today to receive cycle 11 of her neoadjuvant weekly Taxol chemotherapy.  She has plans to return on 12/19/2014 for labs and her final cycle of Taxol chemotherapy.  She is scheduled for a restaging breast MRI on 12/26/2014.  She is scheduled for follow-up visit to review scan results on 12/27/2014.

## 2014-12-15 NOTE — Assessment & Plan Note (Signed)
Patient had only received the Aloxi, Benadryl, and Pepcid as premeds-when she developed some significant pruritus to her bilateral hands and wrist only.  She denied any other hypersensitivity reaction symptoms whatsoever.  On exam.-There is no rash whatsoever.  However, patient is constantly scratching at her hands.  Patient was given Solu-Medrol 125 mg IV and monitor closely.(No chemotherapy had been given when pruritis began).  Patient was also given Aquaphor ointment to apply to her hands as well.  After monitoring patient for approximately 30-40 minutes; decision was made to proceed with patients Taxol chemotherapy.  Patient tolerated the chemotherapy with no difficulty whatsoever.  All itching did eventually resolve.  Patient was advised to call/return of her directly to the emergency for any worsening hypersensitivity reaction symptoms whatsoever.

## 2014-12-15 NOTE — Progress Notes (Signed)
SYMPTOM MANAGEMENT CLINIC   HPI: Barbara Myers 39 y.o. female diagnosed with breast cancer.  Currently undergoing neoadjuvant weekly Taxol chemotherapy.  Patient had only received the Aloxi, Benadryl, and Pepcid as premeds-when she developed some significant pruritus to her bilateral hands and wrist only.  She denied any other hypersensitivity reaction symptoms whatsoever.  On exam.-There is no rash whatsoever.  However, patient is constantly scratching at her hands.  Patient was given Solu-Medrol 125 mg IV and monitor closely.(No chemotherapy had been given when pruritis began).  Patient was also given Aquaphor ointment to apply to her hands as well.  After monitoring patient for approximately 30-40 minutes; decision was made to proceed with patients Taxol chemotherapy.  Patient tolerated the chemotherapy with no difficulty whatsoever.  All itching did eventually resolve.  HPI  ROS  Past Medical History  Diagnosis Date  . Anemia   . Breast cancer of upper-outer quadrant of right female breast 07/24/2014  . Anxiety   . Wears glasses     Past Surgical History  Procedure Laterality Date  . Wisdom tooth extraction    . Umbilical hernia repair      1990  . Portacath placement N/A 08/08/2014    Procedure: INSERTION PORT-A-CATH left subclavian;  Surgeon: Rolm Bookbinder, MD;  Location: [place];  Service: General;  Laterality: N/A;    has Breast cancer of upper-outer quadrant of right female breast; Iron deficiency anemia due to chronic blood loss; Genetic testing; and Pruritus on her problem list.    is allergic to tape.    Medication List       This list is accurate as of: 12/12/14 11:59 PM.  Always use your most recent med list.               ferrous fumarate 325 (106 FE) MG Tabs tablet  Commonly known as:  HEMOCYTE - 106 mg FE  Take 1 tablet by mouth daily.     Investigational atorvastatin/placebo 40 MG tablet [facility] [id number]  Take 1  tablet by mouth daily. Take 2 doses (these doses must be 12 hours apart) prior to first chemotherapy treatment. Then take 1 tablet daily by mouth with or without food.     lidocaine-prilocaine cream  Commonly known as:  EMLA     LORazepam 0.5 MG tablet  Commonly known as:  ATIVAN  Take 1 tablet (0.5 mg total) by mouth every 6 (six) hours as needed (Nausea or vomiting).     metoprolol 50 MG tablet  Commonly known as:  LOPRESSOR  Take 1 tablet (50 mg total) by mouth once.     ranitidine 300 MG tablet  Commonly known as:  ZANTAC  Take 1 tablet (300 mg total) by mouth at bedtime.     venlafaxine XR 37.5 MG 24 hr capsule  Commonly known as:  EFFEXOR-XR  Take 1 capsule (37.5 mg total) by mouth daily with breakfast.         PHYSICAL EXAMINATION  Oncology Vitals 12/12/2014 12/05/2014 11/28/2014 11/21/2014 11/14/2014 11/07/2014 10/31/2014  Height 165 cm - 165 cm - 165 cm - 165 cm  Weight 82.736 kg - 83.961 kg - 84.55 kg - 87.181 kg  Weight (lbs) 182 lbs 6 oz - 185 lbs 2 oz - 186 lbs 6 oz - 192 lbs 3 oz  BMI (kg/m2) 30.35 kg/m2 - 30.8 kg/m2 - 31.02 kg/m2 - 31.98 kg/m2  Temp 98.2 98.6 97.7 97.7 97.7 97 97.7  Pulse 86 72 74 65  71 70 71  Resp 19 18 18 18 18 16 18   SpO2 100 100 100 100 100 100 -  BSA (m2) 1.95 m2 - 1.96 m2 - 1.97 m2 - 2 m2   BP Readings from Last 3 Encounters:  12/12/14 149/99  12/05/14 147/99  11/28/14 168/106    Physical Exam  Constitutional: She is oriented to person, place, and time and well-developed, well-nourished, and in no distress.  HENT:  Head: Normocephalic and atraumatic.  Eyes: Conjunctivae and EOM are normal. Pupils are equal, round, and reactive to light. Right eye exhibits no discharge. Left eye exhibits no discharge. No scleral icterus.  Neck: Normal range of motion.  Pulmonary/Chest: Effort normal. No respiratory distress.  Musculoskeletal: Normal range of motion. She exhibits no edema or tenderness.  Neurological: She is alert and oriented to person,  place, and time. Gait normal.  Skin: Skin is warm and dry. No rash noted. No erythema. No pallor.  Nursing note and vitals reviewed.   LABORATORY DATA:. Appointment on 12/12/2014  Component Date Value Ref Range Status  . WBC 12/12/2014 3.7* 3.9 - 10.3 10e3/uL Final  . NEUT# 12/12/2014 2.4  1.5 - 6.5 10e3/uL Final  . HGB 12/12/2014 10.4* 11.6 - 15.9 g/dL Final  . HCT 12/12/2014 32.1* 34.8 - 46.6 % Final  . Platelets 12/12/2014 298  145 - 400 10e3/uL Final  . MCV 12/12/2014 92.2  79.5 - 101.0 fL Final  . MCH 12/12/2014 29.8  25.1 - 34.0 pg Final  . MCHC 12/12/2014 32.3  31.5 - 36.0 g/dL Final  . RBC 12/12/2014 3.48* 3.70 - 5.45 10e6/uL Final  . RDW 12/12/2014 15.6* 11.2 - 14.5 % Final  . lymph# 12/12/2014 0.9  0.9 - 3.3 10e3/uL Final  . MONO# 12/12/2014 0.2  0.1 - 0.9 10e3/uL Final  . Eosinophils Absolute 12/12/2014 0.1  0.0 - 0.5 10e3/uL Final  . Basophils Absolute 12/12/2014 0.1  0.0 - 0.1 10e3/uL Final  . NEUT% 12/12/2014 65.6  38.4 - 76.8 % Final  . LYMPH% 12/12/2014 25.4  14.0 - 49.7 % Final  . MONO% 12/12/2014 5.3  0.0 - 14.0 % Final  . EOS% 12/12/2014 2.3  0.0 - 7.0 % Final  . BASO% 12/12/2014 1.4  0.0 - 2.0 % Final  . Sodium 12/12/2014 142  136 - 145 mEq/L Final  . Potassium 12/12/2014 3.6  3.5 - 5.1 mEq/L Final  . Chloride 12/12/2014 106  98 - 109 mEq/L Final  . CO2 12/12/2014 26  22 - 29 mEq/L Final  . Glucose 12/12/2014 98  70 - 140 mg/dl Final  . BUN 12/12/2014 6.7* 7.0 - 26.0 mg/dL Final  . Creatinine 12/12/2014 0.7  0.6 - 1.1 mg/dL Final  . Total Bilirubin 12/12/2014 0.43  0.20 - 1.20 mg/dL Final  . Alkaline Phosphatase 12/12/2014 64  40 - 150 U/L Final  . AST 12/12/2014 22  5 - 34 U/L Final  . ALT 12/12/2014 29  0 - 55 U/L Final  . Total Protein 12/12/2014 6.4  6.4 - 8.3 g/dL Final  . Albumin 12/12/2014 3.8  3.5 - 5.0 g/dL Final  . Calcium 12/12/2014 9.2  8.4 - 10.4 mg/dL Final  . Anion Gap 12/12/2014 10  3 - 11 mEq/L Final  . EGFR 12/12/2014 >90  >90  ml/min/1.73 m2 Final   eGFR is calculated using the CKD-EPI Creatinine Equation (2009)     RADIOGRAPHIC STUDIES: No results found.  ASSESSMENT/PLAN:    Breast cancer of upper-outer quadrant of right  female breast Patient presented to the [facility] today to receive cycle 11 of her neoadjuvant weekly Taxol chemotherapy.  She has plans to return on 12/19/2014 for labs and her final cycle of Taxol chemotherapy.  She is scheduled for a restaging breast MRI on 12/26/2014.  She is scheduled for follow-up visit to review scan results on 12/27/2014.   Pruritus Patient had only received the Aloxi, Benadryl, and Pepcid as premeds-when she developed some significant pruritus to her bilateral hands and wrist only.  She denied any other hypersensitivity reaction symptoms whatsoever.  On exam.-There is no rash whatsoever.  However, patient is constantly scratching at her hands.  Patient was given Solu-Medrol 125 mg IV and monitor closely.(No chemotherapy had been given when pruritis began).  Patient was also given Aquaphor ointment to apply to her hands as well.  After monitoring patient for approximately 30-40 minutes; decision was made to proceed with patients Taxol chemotherapy.  Patient tolerated the chemotherapy with no difficulty whatsoever.  All itching did eventually resolve.  Patient was advised to call/return of her directly to the emergency for any worsening hypersensitivity reaction symptoms whatsoever.     Patient stated understanding of all instructions; and was in agreement with this plan of care. The patient knows to call the clinic with any problems, questions or concerns.   Review/collaboration with Dr. Lindi Adie regarding all aspects of patient's visit today.   Total time spent with patient was 25 minutes;  with greater than 75 percent of that time spent in face to face counseling regarding patient's symptoms,  and coordination of care and follow up.  Disclaimer: This note was  dictated with voice recognition software. Similar sounding words can inadvertently be transcribed and may not be corrected upon review.   Drue Second, NP 12/15/2014

## 2014-12-19 ENCOUNTER — Other Ambulatory Visit (HOSPITAL_BASED_OUTPATIENT_CLINIC_OR_DEPARTMENT_OTHER): Payer: Medicaid Other

## 2014-12-19 ENCOUNTER — Ambulatory Visit (HOSPITAL_BASED_OUTPATIENT_CLINIC_OR_DEPARTMENT_OTHER): Payer: Medicaid Other

## 2014-12-19 VITALS — BP 153/98 | HR 67 | Temp 97.5°F

## 2014-12-19 DIAGNOSIS — Z5111 Encounter for antineoplastic chemotherapy: Secondary | ICD-10-CM

## 2014-12-19 DIAGNOSIS — C773 Secondary and unspecified malignant neoplasm of axilla and upper limb lymph nodes: Secondary | ICD-10-CM | POA: Diagnosis not present

## 2014-12-19 DIAGNOSIS — C50411 Malignant neoplasm of upper-outer quadrant of right female breast: Secondary | ICD-10-CM | POA: Diagnosis not present

## 2014-12-19 LAB — COMPREHENSIVE METABOLIC PANEL (CC13)
ALT: 28 U/L (ref 0–55)
ANION GAP: 11 meq/L (ref 3–11)
AST: 19 U/L (ref 5–34)
Albumin: 3.7 g/dL (ref 3.5–5.0)
Alkaline Phosphatase: 61 U/L (ref 40–150)
BILIRUBIN TOTAL: 0.4 mg/dL (ref 0.20–1.20)
BUN: 8.8 mg/dL (ref 7.0–26.0)
CO2: 24 meq/L (ref 22–29)
CREATININE: 0.8 mg/dL (ref 0.6–1.1)
Calcium: 8.8 mg/dL (ref 8.4–10.4)
Chloride: 107 mEq/L (ref 98–109)
Glucose: 107 mg/dl (ref 70–140)
POTASSIUM: 3.8 meq/L (ref 3.5–5.1)
SODIUM: 142 meq/L (ref 136–145)
TOTAL PROTEIN: 6.4 g/dL (ref 6.4–8.3)

## 2014-12-19 LAB — CBC WITH DIFFERENTIAL/PLATELET
BASO%: 0.9 % (ref 0.0–2.0)
Basophils Absolute: 0 10*3/uL (ref 0.0–0.1)
EOS%: 2.4 % (ref 0.0–7.0)
Eosinophils Absolute: 0.1 10*3/uL (ref 0.0–0.5)
HCT: 32.8 % — ABNORMAL LOW (ref 34.8–46.6)
HGB: 10.8 g/dL — ABNORMAL LOW (ref 11.6–15.9)
LYMPH#: 0.9 10*3/uL (ref 0.9–3.3)
LYMPH%: 21.2 % (ref 14.0–49.7)
MCH: 30.6 pg (ref 25.1–34.0)
MCHC: 32.9 g/dL (ref 31.5–36.0)
MCV: 93.2 fL (ref 79.5–101.0)
MONO#: 0.2 10*3/uL (ref 0.1–0.9)
MONO%: 4.7 % (ref 0.0–14.0)
NEUT#: 3.2 10*3/uL (ref 1.5–6.5)
NEUT%: 70.8 % (ref 38.4–76.8)
Platelets: 296 10*3/uL (ref 145–400)
RBC: 3.51 10*6/uL — AB (ref 3.70–5.45)
RDW: 15.6 % — ABNORMAL HIGH (ref 11.2–14.5)
WBC: 4.5 10*3/uL (ref 3.9–10.3)

## 2014-12-19 MED ORDER — FAMOTIDINE IN NACL 20-0.9 MG/50ML-% IV SOLN
20.0000 mg | Freq: Once | INTRAVENOUS | Status: AC
  Administered 2014-12-19: 20 mg via INTRAVENOUS

## 2014-12-19 MED ORDER — PALONOSETRON HCL INJECTION 0.25 MG/5ML
INTRAVENOUS | Status: AC
  Filled 2014-12-19: qty 5

## 2014-12-19 MED ORDER — FAMOTIDINE IN NACL 20-0.9 MG/50ML-% IV SOLN
INTRAVENOUS | Status: AC
  Filled 2014-12-19: qty 50

## 2014-12-19 MED ORDER — DIPHENHYDRAMINE HCL 50 MG/ML IJ SOLN
50.0000 mg | Freq: Once | INTRAMUSCULAR | Status: AC
  Administered 2014-12-19: 50 mg via INTRAVENOUS

## 2014-12-19 MED ORDER — PALONOSETRON HCL INJECTION 0.25 MG/5ML: 0.2500 mg | Freq: Once | INTRAVENOUS | Status: DC

## 2014-12-19 MED ORDER — DIPHENHYDRAMINE HCL 50 MG/ML IJ SOLN
INTRAMUSCULAR | Status: AC
  Filled 2014-12-19: qty 1

## 2014-12-19 MED ORDER — HEPARIN SOD (PORK) LOCK FLUSH 100 UNIT/ML IV SOLN
500.0000 [IU] | Freq: Once | INTRAVENOUS | Status: AC | PRN
  Administered 2014-12-19: 500 [IU]
  Filled 2014-12-19: qty 5

## 2014-12-19 MED ORDER — SODIUM CHLORIDE 0.9 % IJ SOLN
10.0000 mL | INTRAMUSCULAR | Status: DC | PRN
  Administered 2014-12-19: 10 mL
  Filled 2014-12-19: qty 10

## 2014-12-19 MED ORDER — SODIUM CHLORIDE 0.9 % IV SOLN
Freq: Once | INTRAVENOUS | Status: AC
  Administered 2014-12-19: 11:00:00 via INTRAVENOUS

## 2014-12-19 MED ORDER — PACLITAXEL CHEMO INJECTION 300 MG/50ML
65.0000 mg/m2 | Freq: Once | INTRAVENOUS | Status: AC
  Administered 2014-12-19: 132 mg via INTRAVENOUS
  Filled 2014-12-19: qty 22

## 2014-12-19 MED ORDER — SODIUM CHLORIDE 0.9 % IV SOLN
Freq: Once | INTRAVENOUS | Status: AC
  Administered 2014-12-19: 11:00:00 via INTRAVENOUS
  Filled 2014-12-19: qty 4

## 2014-12-19 NOTE — Patient Instructions (Addendum)
[facility] Discharge Instructions for Patients Receiving Chemotherapy  Today you received the following chemotherapy agents Taxotere.   To help prevent nausea and vomiting after your treatment, we encourage you to take your nausea medication.   If you develop nausea and vomiting that is not controlled by your nausea medication, call the clinic.   BELOW ARE SYMPTOMS THAT SHOULD BE REPORTED IMMEDIATELY:  *FEVER GREATER THAN 100.5 F  *CHILLS WITH OR WITHOUT FEVER  NAUSEA AND VOMITING THAT IS NOT CONTROLLED WITH YOUR NAUSEA MEDICATION  *UNUSUAL SHORTNESS OF BREATH  *UNUSUAL BRUISING OR BLEEDING  TENDERNESS IN MOUTH AND THROAT WITH OR WITHOUT PRESENCE OF ULCERS  *URINARY PROBLEMS  *BOWEL PROBLEMS  UNUSUAL RASH Items with * indicate a potential emergency and should be followed up as soon as possible.  Feel free to call the clinic you have any questions or concerns. The clinic phone number is [phone].  Please show the CHEMO ALERT CARD at check-in to the Emergency Department and triage nurse.   

## 2014-12-26 ENCOUNTER — Ambulatory Visit
Admission: RE | Admit: 2014-12-26 | Discharge: 2014-12-26 | Disposition: A | Discharge status: Home / Self Care | Payer: Medicaid Other | Source: Ambulatory Visit | Attending: Hematology and Oncology | Admitting: Hematology and Oncology

## 2014-12-26 DIAGNOSIS — C50411 Malignant neoplasm of upper-outer quadrant of right female breast: Secondary | ICD-10-CM

## 2014-12-26 MED ORDER — GADOBENATE DIMEGLUMINE 529 MG/ML IV SOLN
17.0000 mL | Freq: Once | INTRAVENOUS | Status: AC | PRN
  Administered 2014-12-26: 17 mL via INTRAVENOUS

## 2014-12-26 NOTE — Assessment & Plan Note (Signed)
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 HTN: On metoprolol  RTC after surgery

## 2014-12-27 ENCOUNTER — Encounter: Payer: Self-pay | Admitting: Skilled Nursing Facility1

## 2014-12-27 ENCOUNTER — Encounter: Payer: Self-pay | Admitting: Hematology and Oncology

## 2014-12-27 ENCOUNTER — Ambulatory Visit (HOSPITAL_BASED_OUTPATIENT_CLINIC_OR_DEPARTMENT_OTHER): Payer: Medicaid Other | Admitting: Hematology and Oncology

## 2014-12-27 VITALS — BP 154/102 | HR 73 | Temp 98.2°F | Resp 18 | Ht 65.0 in | Wt 185.8 lb

## 2014-12-27 DIAGNOSIS — Z170 Estrogen receptor positive status [ER+]: Secondary | ICD-10-CM

## 2014-12-27 DIAGNOSIS — C50411 Malignant neoplasm of upper-outer quadrant of right female breast: Secondary | ICD-10-CM | POA: Diagnosis present

## 2014-12-27 DIAGNOSIS — I10 Essential (primary) hypertension: Secondary | ICD-10-CM

## 2014-12-27 NOTE — Progress Notes (Signed)
Patient Care Team: No Pcp Per Patient as PCP - General (General Practice) Rolm Bookbinder, MD as Consulting Physician (General Surgery) Nicholas Lose, MD as Consulting Physician (Hematology and Oncology) Thea Silversmith, MD as Consulting Physician (Radiation Oncology) Holley Bouche, NP as Nurse Practitioner (Nurse Practitioner) Carola Frost, RN as Registered Nurse (Oncology)  DIAGNOSIS: Breast cancer of upper-outer quadrant of right female breast   Staging form: Breast, AJCC 7th Edition     Clinical stage from 07/26/2014: Stage IIB (T2, N1, M0) - Unsigned       Staging comments: Staged at breast conference on 12.16.15    SUMMARY OF ONCOLOGIC HISTORY:   Breast cancer of upper-outer quadrant of right female breast   07/13/2014 Mammogram indeterminate mass in the right breast by ultrasound measured 2 cm at 10:00 position   07/20/2014 Initial Diagnosis right breast biopsy: Invasive ductal carcinoma, 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, grade 3,ER/PR positive, HER-2 equivocal ratio 1.55, gene copy #4.35 (being repeated), Ki-67 40%   08/09/2014 -  Neo-Adjuvant Chemotherapy Dose dense Adriamycin and Cytoxan 4 followed by Taxol 12    Procedure Genetic testing revealed three variant's of unknown significance - MRE11A c.256G>C, PMS2 c.1004A>G, and RAD51C c.200A>G - but was otherwise normal, and did not reveal a deleterious mutation in these genes    CHIEF COMPLIANT: Follow-up to discuss breast MRI.  INTERVAL HISTORY: Barbara Myers is a 39 year old with above-mentioned history of right breast cancer who had neoadjuvant chemotherapy and is here today to discuss the breast MRI. Neoadjuvant therapy. She has done extremely well from chemotherapy standpoint.  REVIEW OF SYSTEMS:   Constitutional: Denies fevers, chills or abnormal weight loss Eyes: Denies blurriness of vision Ears, nose, mouth, throat, and face: Denies mucositis or sore throat Respiratory:  Denies cough, dyspnea or wheezes Cardiovascular: Denies palpitation, chest discomfort or lower extremity swelling Gastrointestinal:  Denies nausea, heartburn or change in bowel habits Skin: Denies abnormal skin rashes Lymphatics: Denies new lymphadenopathy or easy bruising Neurological:Denies numbness, tingling or new weaknesses Behavioral/Psych: Mood is stable, no new changes  Breast:  denies any pain or lumps or nodules in either breasts All other systems were reviewed with the patient and are negative.  I have reviewed the past medical history, past surgical history, social history and family history with the patient and they are unchanged from previous note.  ALLERGIES:  is allergic to tape.  MEDICATIONS:  Current Outpatient Prescriptions  Medication Sig Dispense Refill  . Atorvastatin Calcium (INVESTIGATIONAL ATORVASTATIN/PLACEBO) 40 MG tablet [facility] [id number] Take 1 tablet by mouth daily. Take 2 doses (these doses must be 12 hours apart) prior to first chemotherapy treatment. Then take 1 tablet daily by mouth with or without food. 180 tablet 0  . ferrous fumarate (HEMOCYTE - 106 MG FE) 325 (106 FE) MG TABS tablet Take 1 tablet by mouth daily.    Marland Kitchen lidocaine-prilocaine (EMLA) cream   3  . LORazepam (ATIVAN) 0.5 MG tablet Take 1 tablet (0.5 mg total) by mouth every 6 (six) hours as needed (Nausea or vomiting). 30 tablet 3  . metoprolol (LOPRESSOR) 50 MG tablet Take 1 tablet (50 mg total) by mouth once. 30 tablet 3  . venlafaxine XR (EFFEXOR-XR) 37.5 MG 24 hr capsule Take 1 capsule (37.5 mg total) by mouth daily with breakfast. 30 capsule 3  . ranitidine (ZANTAC) 300 MG tablet Take 1 tablet (300 mg total) by mouth at bedtime. (Patient not taking: Reported on 12/27/2014) 30 tablet 0  No current facility-administered medications for this visit.    PHYSICAL EXAMINATION: ECOG PERFORMANCE STATUS: 1 - Symptomatic but completely ambulatory  Filed Vitals:   12/27/14 0913  BP: 154/102   Pulse: 73  Temp: 98.2 F (36.8 C)  Resp: 18   Filed Weights   12/27/14 0913  Weight: 185 lb 12.8 oz (84.278 kg)    GENERAL:alert, no distress and comfortable SKIN: skin color, texture, turgor are normal, no rashes or significant lesions EYES: normal, Conjunctiva are pink and non-injected, sclera clear OROPHARYNX:no exudate, no erythema and lips, buccal mucosa, and tongue normal  NECK: supple, thyroid normal size, non-tender, without nodularity LYMPH:  no palpable lymphadenopathy in the cervical, axillary or inguinal LUNGS: clear to auscultation and percussion with normal breathing effort HEART: regular rate & rhythm and no murmurs and no lower extremity edema ABDOMEN:abdomen soft, non-tender and normal bowel sounds Musculoskeletal:no cyanosis of digits and no clubbing  NEURO: alert & oriented x 3 with fluent speech, no focal motor/sensory deficits  LABORATORY DATA:  I have reviewed the data as listed   Chemistry      Component Value Date/Time   NA 142 12/19/2014 1005   NA 138 01/22/2011 2330   K 3.8 12/19/2014 1005   K 3.6 01/22/2011 2330   CL 104 01/22/2011 2330   CO2 24 12/19/2014 1005   CO2 28 01/22/2011 2330   BUN 8.8 12/19/2014 1005   BUN 9 01/22/2011 2330   CREATININE 0.8 12/19/2014 1005   CREATININE 0.72 01/22/2011 2330      Component Value Date/Time   CALCIUM 8.8 12/19/2014 1005   CALCIUM 9.2 01/22/2011 2330   ALKPHOS 61 12/19/2014 1005   ALKPHOS 55 01/22/2011 2330   AST 19 12/19/2014 1005   AST 21 01/22/2011 2330   ALT 28 12/19/2014 1005   ALT 15 01/22/2011 2330   BILITOT 0.40 12/19/2014 1005   BILITOT 0.2* 01/22/2011 2330       Lab Results  Component Value Date   WBC 4.5 12/19/2014   HGB 10.8* 12/19/2014   HCT 32.8* 12/19/2014   MCV 93.2 12/19/2014   PLT 296 12/19/2014   NEUTROABS 3.2 12/19/2014     RADIOGRAPHIC STUDIES: I have personally reviewed the radiology reports and agreed with their findings. Mr Breast Bilateral W Wo  Contrast  12/27/2014   CLINICAL DATA:  39 year old female with history of right breast invasive ductal carcinoma with a biopsied lymph node positive for metastatic carcinoma diagnosed December 2015. Patient is currently undergoing neoadjuvant chemotherapy. Assess response to therapy.  LABS:  Not applicable.  EXAM: BILATERAL BREAST MRI WITH AND WITHOUT CONTRAST  TECHNIQUE: Multiplanar, multisequence MR images of both breasts were obtained prior to and following the intravenous administration of 17 ml of MultiHance.  THREE-DIMENSIONAL MR IMAGE RENDERING ON INDEPENDENT WORKSTATION:  Three-dimensional MR images were rendered by post-processing of the original MR data on an independent workstation. The three-dimensional MR images were interpreted, and findings are reported in the following complete MRI report for this study. Three dimensional images were evaluated at the independent DynaCad workstation  COMPARISON:  Previous exam(s).  FINDINGS: Breast composition: c.  Heterogeneous fibroglandular tissue.  Background parenchymal enhancement: Mild to moderate.  Right breast: Interval decrease in size of known biopsy proven malignancy in the upper outer far posterior right breast which is now less masslike in appearance, with nodularity and non mass enhancement now measuring approximately 3.8 cm AP, 2.4 cm transverse, and 2.2 cm craniocaudal (previously measured up to 4.6 x 2.6 when remeasured  in a similar manner.  Left breast: No suspicious rapidly enhancing masses or abnormal areas of enhancement seen in the left breast to suggest malignancy.  Lymph nodes: No morphologically abnormal axillary lymph nodes, with the previously biopsied lymph node in the low right axilla containing mild cortical thickening now normal in appearance. No internal mammary lymphadenopathy.  Ancillary findings:  A left chest wall Infuse-A-Port is present.  IMPRESSION: 1. Decreased size of right breast malignancy, now measuring up to 3.8 cm AP,  previously measured up to 4.6 cm AP when remeasured in a similar manner.  2. No new suspicious findings in either breast, and no axillary lymphadenopathy seen (previously seen lymph node in the low right axilla with mild cortical thickening is now normal in appearance).  RECOMMENDATION: Treatment plan for known right breast malignancy.  BI-RADS CATEGORY  6: Known biopsy-proven malignancy.   Electronically Signed   By: Everlean Alstrom M.D.   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 HTN: On metoprolol, we will increase the dose of metoprolol to 50 mg twice a day. Her blood pressure still elevated. Breast MRI: Decrease in size of the right breast malignancy previously measured 4.6 cm now measuring 3.8 cm no axillary lymphadenopathy seen.  We will present her the breast tumor board. She could be a candidate for [facility] clinical trial. RTC after surgery   No orders of the defined types were placed in this encounter.   The patient has a good understanding of the overall plan. she agrees with it. she will call with any problems that may develop before the next visit here.   Rulon Eisenmenger, MD

## 2014-12-27 NOTE — Progress Notes (Signed)
Subjective:     Patient ID: Barbara Myers, female   DOB: 05-Oct-1975, 39 y.o.   MRN: [id number]  HPI   Review of Systems     Objective:   Physical Exam To assist the pt in identifying some dietary strategies in gaining some lost wt back.     Assessment:     Pt identified as being malnourished due to lost wt. Pt contacted via the telephone at [phone]. Pt was unavailable.    Plan:     Dietitian left a voicemail prompting the pt to contact Ernestene Kiel RD,CSO,LDN at [phone] at their earliest convenience.

## 2014-12-28 ENCOUNTER — Other Ambulatory Visit: Payer: Self-pay | Admitting: General Surgery

## 2014-12-28 DIAGNOSIS — C50411 Malignant neoplasm of upper-outer quadrant of right female breast: Secondary | ICD-10-CM

## 2014-12-29 ENCOUNTER — Encounter: Payer: Self-pay | Admitting: *Deleted

## 2014-12-30 ENCOUNTER — Other Ambulatory Visit: Payer: Self-pay

## 2015-01-01 ENCOUNTER — Telehealth: Payer: Self-pay | Admitting: Hematology and Oncology

## 2015-01-01 NOTE — Telephone Encounter (Signed)
pt vm full.....mailed pt appt sched and letter

## 2015-01-02 ENCOUNTER — Other Ambulatory Visit: Payer: Self-pay | Admitting: *Deleted

## 2015-01-02 DIAGNOSIS — C50411 Malignant neoplasm of upper-outer quadrant of right female breast: Secondary | ICD-10-CM

## 2015-01-02 MED ORDER — METOPROLOL TARTRATE 50 MG PO TABS: 50.0000 mg | ORAL_TABLET | Freq: Two times a day (BID) | ORAL | Status: DC

## 2015-01-11 ENCOUNTER — Encounter: Payer: Medicaid Other | Admitting: *Deleted

## 2015-01-12 ENCOUNTER — Other Ambulatory Visit: Payer: Self-pay | Admitting: *Deleted

## 2015-01-12 ENCOUNTER — Other Ambulatory Visit: Payer: Self-pay | Admitting: Hematology and Oncology

## 2015-01-12 ENCOUNTER — Encounter (HOSPITAL_BASED_OUTPATIENT_CLINIC_OR_DEPARTMENT_OTHER): Payer: Self-pay | Admitting: *Deleted

## 2015-01-12 DIAGNOSIS — C50411 Malignant neoplasm of upper-outer quadrant of right female breast: Secondary | ICD-10-CM

## 2015-01-12 DIAGNOSIS — C50911 Malignant neoplasm of unspecified site of right female breast: Secondary | ICD-10-CM

## 2015-01-16 ENCOUNTER — Ambulatory Visit (HOSPITAL_BASED_OUTPATIENT_CLINIC_OR_DEPARTMENT_OTHER): Payer: Medicaid Other | Admitting: Anesthesiology

## 2015-01-16 ENCOUNTER — Encounter (HOSPITAL_BASED_OUTPATIENT_CLINIC_OR_DEPARTMENT_OTHER): Payer: Self-pay | Admitting: *Deleted

## 2015-01-16 ENCOUNTER — Ambulatory Visit (HOSPITAL_COMMUNITY)
Admission: RE | Admit: 2015-01-16 | Discharge: 2015-01-16 | Disposition: A | Discharge status: Home / Self Care | Payer: Medicaid Other | Source: Ambulatory Visit | Attending: General Surgery | Admitting: General Surgery

## 2015-01-16 ENCOUNTER — Encounter (HOSPITAL_BASED_OUTPATIENT_CLINIC_OR_DEPARTMENT_OTHER)
Admission: RE | Disposition: A | Discharge status: Home / Self Care | Payer: Self-pay | Source: Ambulatory Visit | Attending: General Surgery

## 2015-01-16 ENCOUNTER — Ambulatory Visit (HOSPITAL_BASED_OUTPATIENT_CLINIC_OR_DEPARTMENT_OTHER)
Admission: RE | Admit: 2015-01-16 | Discharge: 2015-01-16 | Disposition: A | Discharge status: Home / Self Care | Payer: Medicaid Other | Source: Ambulatory Visit | Attending: General Surgery | Admitting: General Surgery

## 2015-01-16 DIAGNOSIS — C773 Secondary and unspecified malignant neoplasm of axilla and upper limb lymph nodes: Secondary | ICD-10-CM | POA: Diagnosis not present

## 2015-01-16 DIAGNOSIS — C50411 Malignant neoplasm of upper-outer quadrant of right female breast: Secondary | ICD-10-CM

## 2015-01-16 DIAGNOSIS — F419 Anxiety disorder, unspecified: Secondary | ICD-10-CM | POA: Diagnosis not present

## 2015-01-16 DIAGNOSIS — E669 Obesity, unspecified: Secondary | ICD-10-CM | POA: Insufficient documentation

## 2015-01-16 DIAGNOSIS — Z87891 Personal history of nicotine dependence: Secondary | ICD-10-CM | POA: Insufficient documentation

## 2015-01-16 DIAGNOSIS — I10 Essential (primary) hypertension: Secondary | ICD-10-CM | POA: Diagnosis not present

## 2015-01-16 DIAGNOSIS — Z6831 Body mass index (BMI) 31.0-31.9, adult: Secondary | ICD-10-CM | POA: Insufficient documentation

## 2015-01-16 HISTORY — DX: Essential (primary) hypertension: I10

## 2015-01-16 HISTORY — PX: RADIOACTIVE SEED GUIDED PARTIAL MASTECTOMY WITH AXILLARY SENTINEL LYMPH NODE BIOPSY: SHX6520

## 2015-01-16 SURGERY — RADIOACTIVE SEED GUIDED PARTIAL MASTECTOMY WITH AXILLARY SENTINEL LYMPH NODE BIOPSY
Anesthesia: Regional | Site: Breast | Laterality: Right

## 2015-01-16 MED ORDER — PROPOFOL 10 MG/ML IV BOLUS
INTRAVENOUS | Status: DC | PRN
  Administered 2015-01-16: 250 mg via INTRAVENOUS

## 2015-01-16 MED ORDER — DEXAMETHASONE SODIUM PHOSPHATE 4 MG/ML IJ SOLN
INTRAMUSCULAR | Status: DC | PRN
  Administered 2015-01-16: 10 mg via INTRAVENOUS

## 2015-01-16 MED ORDER — FENTANYL CITRATE (PF) 100 MCG/2ML IJ SOLN
INTRAMUSCULAR | Status: DC | PRN
  Administered 2015-01-16: 50 ug via INTRAVENOUS

## 2015-01-16 MED ORDER — OXYCODONE HCL 5 MG/5ML PO SOLN: 5.0000 mg | Freq: Once | ORAL | Status: DC | PRN

## 2015-01-16 MED ORDER — SODIUM CHLORIDE 0.9 % IJ SOLN
INTRAMUSCULAR | Status: DC | PRN
  Administered 2015-01-16: 5 mL

## 2015-01-16 MED ORDER — FENTANYL CITRATE (PF) 100 MCG/2ML IJ SOLN
50.0000 ug | INTRAMUSCULAR | Status: DC | PRN
  Administered 2015-01-16: 100 ug via INTRAVENOUS

## 2015-01-16 MED ORDER — ONDANSETRON HCL 4 MG/2ML IJ SOLN: 4.0000 mg | Freq: Four times a day (QID) | INTRAMUSCULAR | Status: DC | PRN

## 2015-01-16 MED ORDER — TECHNETIUM TC 99M SULFUR COLLOID FILTERED
1.0000 | Freq: Once | INTRAVENOUS | Status: AC | PRN
  Administered 2015-01-16: 1 via INTRADERMAL

## 2015-01-16 MED ORDER — CEFAZOLIN SODIUM-DEXTROSE 2-3 GM-% IV SOLR
INTRAVENOUS | Status: AC
  Filled 2015-01-16: qty 50

## 2015-01-16 MED ORDER — LACTATED RINGERS IV SOLN
INTRAVENOUS | Status: DC
  Administered 2015-01-16: 07:00:00 via INTRAVENOUS
  Administered 2015-01-16: 10 mL/h via INTRAVENOUS

## 2015-01-16 MED ORDER — MIDAZOLAM HCL 2 MG/2ML IJ SOLN
INTRAMUSCULAR | Status: AC
  Filled 2015-01-16: qty 2

## 2015-01-16 MED ORDER — LIDOCAINE HCL (CARDIAC) 20 MG/ML IV SOLN
INTRAVENOUS | Status: DC | PRN
  Administered 2015-01-16: 60 mg via INTRAVENOUS

## 2015-01-16 MED ORDER — BUPIVACAINE HCL (PF) 0.25 % IJ SOLN
INTRAMUSCULAR | Status: AC
  Filled 2015-01-16: qty 30

## 2015-01-16 MED ORDER — SODIUM CHLORIDE 0.9 % IJ SOLN
INTRAMUSCULAR | Status: AC
  Filled 2015-01-16: qty 10

## 2015-01-16 MED ORDER — CEFAZOLIN SODIUM-DEXTROSE 2-3 GM-% IV SOLR
2.0000 g | INTRAVENOUS | Status: AC
  Administered 2015-01-16: 2 g via INTRAVENOUS

## 2015-01-16 MED ORDER — BUPIVACAINE HCL (PF) 0.25 % IJ SOLN
INTRAMUSCULAR | Status: DC | PRN
  Administered 2015-01-16: 5 mL

## 2015-01-16 MED ORDER — BUPIVACAINE-EPINEPHRINE (PF) 0.5% -1:200000 IJ SOLN
INTRAMUSCULAR | Status: DC | PRN
  Administered 2015-01-16: 30 mL via PERINEURAL

## 2015-01-16 MED ORDER — METHYLENE BLUE 1 % INJ SOLN
INTRAMUSCULAR | Status: AC
  Filled 2015-01-16: qty 10

## 2015-01-16 MED ORDER — OXYCODONE HCL 5 MG PO TABS: 5.0000 mg | ORAL_TABLET | Freq: Once | ORAL | Status: DC | PRN

## 2015-01-16 MED ORDER — OXYCODONE-ACETAMINOPHEN 10-325 MG PO TABS: 1.0000 | ORAL_TABLET | Freq: Four times a day (QID) | ORAL | Status: DC | PRN

## 2015-01-16 MED ORDER — FENTANYL CITRATE (PF) 100 MCG/2ML IJ SOLN
INTRAMUSCULAR | Status: AC
  Filled 2015-01-16: qty 2

## 2015-01-16 MED ORDER — ONDANSETRON HCL 4 MG/2ML IJ SOLN
INTRAMUSCULAR | Status: DC | PRN
  Administered 2015-01-16: 4 mg via INTRAVENOUS

## 2015-01-16 MED ORDER — FENTANYL CITRATE (PF) 100 MCG/2ML IJ SOLN
INTRAMUSCULAR | Status: AC
  Filled 2015-01-16: qty 6

## 2015-01-16 MED ORDER — MIDAZOLAM HCL 2 MG/2ML IJ SOLN
1.0000 mg | INTRAMUSCULAR | Status: DC | PRN
  Administered 2015-01-16: 2 mg via INTRAVENOUS

## 2015-01-16 MED ORDER — FENTANYL CITRATE (PF) 100 MCG/2ML IJ SOLN
25.0000 ug | INTRAMUSCULAR | Status: DC | PRN
  Administered 2015-01-16 (×2): 50 ug via INTRAVENOUS

## 2015-01-16 MED ORDER — GLYCOPYRROLATE 0.2 MG/ML IJ SOLN: 0.2000 mg | Freq: Once | INTRAMUSCULAR | Status: DC | PRN

## 2015-01-16 SURGICAL SUPPLY — 62 items
APPLIER CLIP 9.375 MED OPEN (MISCELLANEOUS) ×3
BENZOIN TINCTURE PRP APPL 2/3 (GAUZE/BANDAGES/DRESSINGS) IMPLANT
BINDER BREAST LRG (GAUZE/BANDAGES/DRESSINGS) IMPLANT
BINDER BREAST MEDIUM (GAUZE/BANDAGES/DRESSINGS) IMPLANT
BINDER BREAST XLRG (GAUZE/BANDAGES/DRESSINGS) IMPLANT
BINDER BREAST XXLRG (GAUZE/BANDAGES/DRESSINGS) ×3 IMPLANT
BLADE SURG 15 STRL LF DISP TIS (BLADE) ×1 IMPLANT
BLADE SURG 15 STRL SS (BLADE) ×2
CANISTER SUC SOCK COL 7IN (MISCELLANEOUS) IMPLANT
CANISTER SUCT 1200ML W/VALVE (MISCELLANEOUS) IMPLANT
CHLORAPREP W/TINT 26ML (MISCELLANEOUS) ×3 IMPLANT
CLIP APPLIE 9.375 MED OPEN (MISCELLANEOUS) ×1 IMPLANT
CLOSURE WOUND 1/2 X4 (GAUZE/BANDAGES/DRESSINGS) ×1
COVER BACK TABLE 60X90IN (DRAPES) ×3 IMPLANT
COVER MAYO STAND STRL (DRAPES) ×3 IMPLANT
COVER PROBE W GEL 5X96 (DRAPES) ×3 IMPLANT
DECANTER SPIKE VIAL GLASS SM (MISCELLANEOUS) IMPLANT
DEVICE DUBIN W/COMP PLATE 8390 (MISCELLANEOUS) ×3 IMPLANT
DRAPE LAPAROSCOPIC ABDOMINAL (DRAPES) ×3 IMPLANT
DRSG TEGADERM 4X4.75 (GAUZE/BANDAGES/DRESSINGS) IMPLANT
ELECT COATED BLADE 2.86 ST (ELECTRODE) ×3 IMPLANT
ELECT REM PT RETURN 9FT ADLT (ELECTROSURGICAL) ×3
ELECTRODE REM PT RTRN 9FT ADLT (ELECTROSURGICAL) ×1 IMPLANT
GLOVE BIO SURGEON STRL SZ7 (GLOVE) ×6 IMPLANT
GLOVE BIOGEL PI IND STRL 6.5 (GLOVE) ×2 IMPLANT
GLOVE BIOGEL PI IND STRL 7.5 (GLOVE) ×1 IMPLANT
GLOVE BIOGEL PI INDICATOR 6.5 (GLOVE) ×4
GLOVE BIOGEL PI INDICATOR 7.5 (GLOVE) ×2
GLOVE ECLIPSE 6.5 STRL STRAW (GLOVE) ×3 IMPLANT
GOWN STRL REUS W/ TWL LRG LVL3 (GOWN DISPOSABLE) ×2 IMPLANT
GOWN STRL REUS W/TWL LRG LVL3 (GOWN DISPOSABLE) ×4
KIT MARKER MARGIN INK (KITS) ×3 IMPLANT
LIQUID BAND (GAUZE/BANDAGES/DRESSINGS) ×3 IMPLANT
MARKER SKIN DUAL TIP RULER LAB (MISCELLANEOUS) ×6 IMPLANT
NDL SAFETY ECLIPSE 18X1.5 (NEEDLE) ×1 IMPLANT
NEEDLE HYPO 18GX1.5 SHARP (NEEDLE) ×2
NEEDLE HYPO 25X1 1.5 SAFETY (NEEDLE) ×6 IMPLANT
NS IRRIG 1000ML POUR BTL (IV SOLUTION) IMPLANT
PACK BASIN DAY SURGERY FS (CUSTOM PROCEDURE TRAY) ×3 IMPLANT
PENCIL BUTTON HOLSTER BLD 10FT (ELECTRODE) ×3 IMPLANT
SHEET MEDIUM DRAPE 40X70 STRL (DRAPES) IMPLANT
SLEEVE SCD COMPRESS KNEE MED (MISCELLANEOUS) ×3 IMPLANT
SPONGE GAUZE 4X4 12PLY STER LF (GAUZE/BANDAGES/DRESSINGS) IMPLANT
SPONGE LAP 4X18 X RAY DECT (DISPOSABLE) ×3 IMPLANT
STAPLER VISISTAT 35W (STAPLE) ×3 IMPLANT
STOCKINETTE IMPERVIOUS LG (DRAPES) IMPLANT
STRIP CLOSURE SKIN 1/2X4 (GAUZE/BANDAGES/DRESSINGS) ×2 IMPLANT
SUT ETHILON 2 0 FS 18 (SUTURE) IMPLANT
SUT MNCRL AB 4-0 PS2 18 (SUTURE) ×3 IMPLANT
SUT MON AB 5-0 PS2 18 (SUTURE) IMPLANT
SUT SILK 2 0 SH (SUTURE) IMPLANT
SUT VIC AB 2-0 SH 27 (SUTURE) ×2
SUT VIC AB 2-0 SH 27XBRD (SUTURE) ×1 IMPLANT
SUT VIC AB 3-0 SH 27 (SUTURE) ×2
SUT VIC AB 3-0 SH 27X BRD (SUTURE) ×1 IMPLANT
SUT VIC AB 5-0 PS2 18 (SUTURE) IMPLANT
SYR CONTROL 10ML LL (SYRINGE) ×6 IMPLANT
TOWEL OR 17X24 6PK STRL BLUE (TOWEL DISPOSABLE) ×3 IMPLANT
TOWEL OR NON WOVEN STRL DISP B (DISPOSABLE) ×3 IMPLANT
TUBE CONNECTING 20'X1/4 (TUBING)
TUBE CONNECTING 20X1/4 (TUBING) IMPLANT
YANKAUER SUCT BULB TIP NO VENT (SUCTIONS) IMPLANT

## 2015-01-16 NOTE — Anesthesia Procedure Notes (Addendum)
Anesthesia Regional Block:  Pectoralis block  Pre-Anesthetic Checklist: ,, timeout performed, Correct Patient, Correct Site, Correct Laterality, Correct Procedure, Correct Position, site marked, Risks and benefits discussed,  Surgical consent,  Pre-op evaluation,  At surgeon's request and post-op pain management  Laterality: Right  Prep: chloraprep       Needles:  Injection technique: Single-shot  Needle Type: Echogenic Needle     Needle Length: 9cm 9 cm Needle Gauge: 21 and 21 G    Additional Needles:  Procedures: ultrasound guided (picture in chart) Pectoralis block Narrative:  Start time: 01/16/2015 6:55 AM End time: 01/16/2015 7:04 AM Injection made incrementally with aspirations every 5 mL.  Performed by: Personally  Anesthesiologist: HODIERNE, ADAM  Additional Notes: Pt tolerated the procedure well.   Procedure Name: LMA Insertion Date/Time: 01/16/2015 7:29 AM Performed by: Lyndee Leo Pre-anesthesia Checklist: Patient identified, Emergency Drugs available, Suction available and Patient being monitored Patient Re-evaluated:Patient Re-evaluated prior to inductionOxygen Delivery Method: Circle System Utilized Preoxygenation: Pre-oxygenation with 100% oxygen Intubation Type: IV induction Ventilation: Mask ventilation without difficulty LMA: LMA inserted LMA Size: 4.0 Number of attempts: 1 Airway Equipment and Method: Bite block Placement Confirmation: positive ETCO2 Tube secured with: Tape Dental Injury: Teeth and Oropharynx as per pre-operative assessment

## 2015-01-16 NOTE — Transfer of Care (Signed)
Immediate Anesthesia Transfer of Care Note  Patient: Barbara Myers  Procedure(s) Performed: Procedure(s): RADIOACTIVE SEED GUIDED PARTIAL MASTECTOMY WITH RIGHT RADIOACTIVE SEED GUIDED AXILLARY SENTINEL LYMPH NODE BIOPSY (Right)  Patient Location: PACU  Anesthesia Type:GA combined with regional for post-op pain  Level of Consciousness: awake, sedated and lethargic  Airway & Oxygen Therapy: Patient Spontanous Breathing and Patient connected to face mask oxygen  Post-op Assessment: Report given to RN and Post -op Vital signs reviewed and stable  Post vital signs: Reviewed and stable  Last Vitals:  Filed Vitals:   01/16/15 0710  BP:   Pulse: 69  Temp:   Resp: 12    Complications: No apparent anesthesia complications

## 2015-01-16 NOTE — Progress Notes (Signed)
Emotional support during breast injections °

## 2015-01-16 NOTE — Progress Notes (Signed)
  Assisted Dr. Hodierne with right, ultrasound guided, pectoralis block. Side rails up, monitors on throughout procedure. See vital signs in flow sheet. Tolerated Procedure well. 

## 2015-01-16 NOTE — Anesthesia Preprocedure Evaluation (Signed)
Anesthesia Evaluation  Patient identified by MRN, date of birth, ID band Patient awake    Reviewed: Allergy & Precautions, NPO status , Patient's Chart, lab work & pertinent test results  Airway Mallampati: II   Neck ROM: full    Dental   Pulmonary former smoker,  breath sounds clear to auscultation        Cardiovascular hypertension, Rhythm:regular Rate:Normal     Neuro/Psych Anxiety    GI/Hepatic   Endo/Other  obese  Renal/GU      Musculoskeletal   Abdominal   Peds  Hematology   Anesthesia Other Findings   Reproductive/Obstetrics Breast CA                             Anesthesia Physical Anesthesia Plan  ASA: II  Anesthesia Plan: General and Regional   Post-op Pain Management: MAC Combined w/ Regional for Post-op pain   Induction: Intravenous  Airway Management Planned: LMA  Additional Equipment:   Intra-op Plan:   Post-operative Plan:   Informed Consent: I have reviewed the patients History and Physical, chart, labs and discussed the procedure including the risks, benefits and alternatives for the proposed anesthesia with the patient or authorized representative who has indicated his/her understanding and acceptance.     Plan Discussed with: CRNA, Anesthesiologist and Surgeon  Anesthesia Plan Comments:         Anesthesia Quick Evaluation

## 2015-01-16 NOTE — Anesthesia Postprocedure Evaluation (Signed)
Anesthesia Post Note  Patient: Barbara Myers  Procedure(s) Performed: Procedure(s) (LRB): RADIOACTIVE SEED GUIDED PARTIAL MASTECTOMY WITH RIGHT RADIOACTIVE SEED GUIDED AXILLARY SENTINEL LYMPH NODE BIOPSY (Right)  Anesthesia type: General  Patient location: PACU  Post pain: Pain level controlled and Adequate analgesia  Post assessment: Post-op Vital signs reviewed, Patient's Cardiovascular Status Stable, Respiratory Function Stable, Patent Airway and Pain level controlled  Last Vitals:  Filed Vitals:   01/16/15 0915  BP:   Pulse:   Temp:   Resp: 20    Post vital signs: Reviewed and stable  Level of consciousness: awake, alert  and oriented  Complications: No apparent anesthesia complications

## 2015-01-16 NOTE — Op Note (Signed)
Preoperative diagnosis: Clinical stage II right breast cancer status post primary chemotherapy Postoperative diagnosis: Same as above Procedure: #1 right breast radioactive seed guided lumpectomy #2 Targeted right axillary lymph node excision, right axillary sentinel node biopsy plus excision of radioactive seed marked node #3 injection of blue dye Surgeon: Dr. Serita Grammes Anesthesia: Gen. With pectoral block Estimated blood loss: Minimal Complications: None Drains: None Specimens: #1 right breast marked with paint #2 radioactive seed node, this was not the sentinel node #3 right axillary sentinel nodes with highest count of 667 Sponge and needle count was correct at completion Disposition to recovery stable  Indications: This is a 39 year old female who has undergone primary chemotherapy. She has had a good response. We discussed her options and elected undergo breast conservation therapy. She had a radioactive seed placed in the lesion prior to beginning. She also had positive nodes at the beginning that were clinically negative upon completion. She elected not to enroll in a trial and we discussed a target axillary lymph node removal. She had a radioactive seed placed in the previously abnormal node prior to beginning as well. These mammograms were in the operating room.  Procedure: After informed consent was obtained the patient was taken the operating room. She first underwent a pectoral block as well as technetium in a standard periareolar fashion. She was given cefazolin. Sequential compression devices were on her legs. She was placed under general anesthesia without complication. Her right breast was prepped and draped in the standard sterile surgical fashion. Surgical timeout was then performed.  I injected 4 mL in all for the cardinal positions around the areola with a methylene blue and saline mixture. I massaged this.   I then identified the radioactive seed labeled node. I  made an incision below the axillary hairline. I used the neoprobe to guide excision of the radioactive seed and the node that is associated with it. This was excised. Mammogram was taken confirming removal of the seed. This was confirmed by radiology. This was sent to pathology and the seed was retrieved. I then switched the neoprobe to the technetium setting. The sentinel node was not the radioactive labeled node. I then removed the sentinel nodes together. The highest count is listed as above. There was no background blue dye or any radioactivity upon completion. The sentinel node did have blue dye present in it. Hemostasis was observed. I then closed this with 2-0 Vicryl, 3-0 Vicryl, and 4-0 Monocryl. Steri-Strips and glue were placed.  I then identified the radioactive seed in the upper outer quadrant of the breast. I made an incision in the axilla and the crease of the breast and I used the neoprobe to guide excision of the radioactive seed as well as the surrounding tissue. This was done with an attempt to get a clear margin. I then passed this off the table. The clip and the radioactive seed were both present in the mammogram. This was confirmed by radiology. I then obtained hemostasis. I placed clips in all positions around the cavity. I closed the cavity with 2-0 Vicryl. The skin was closed with 3-0 Vicryl 4 Monocryl. Steri-Strips are placed. She tolerated this well and was extubated in the operating room.

## 2015-01-16 NOTE — Interval H&P Note (Signed)
History and Physical Interval Note:  01/16/2015 7:14 AM  Barbara Myers  has presented today for surgery, with the diagnosis of RIGHT BREAST CANCER  The various methods of treatment have been discussed with the patient and family. After consideration of risks, benefits and other options for treatment, the patient has consented to  Procedure(s): RADIOACTIVE SEED GUIDED PARTIAL MASTECTOMY WITH RIGHT RADIOACTIVE SEED GUIDED AXILLARY SENTINEL LYMPH NODE BIOPSY (Right) as a surgical intervention .  The patient's history has been reviewed, patient examined, no change in status, stable for surgery.  I have reviewed the patient's chart and labs.  Questions were answered to the patient's satisfaction.     Barbara Myers

## 2015-01-16 NOTE — H&P (Signed)
39 yof who was diagnosed with right breast cancer 12/15. she had palpable right breast mass at that time. no discharge. has undergone genetics that has three vus but otherwise negative. She had mm with density of c. had a 2 cm irregular mass in her right breast. 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 initial mri showed a about a 4.6x2.6 cm mass. she has undergone primary chemotherapy with no real significant side effects that was AC-T. she completed chemo on 5/10 and is here to discuss surgery. She had repeat mri that shows nl nodes, nl left breast, right breast mass is now 3.8x2.4x2.2 cm in size. She did not feel this anymore during chemotherapy and the skin changes are gone from local issues.    Other Problems Marjean Donna, CMA; 12/28/2014 10:47 AM) Breast Cancer High blood pressure  Past Surgical History Marjean Donna, CMA; 12/28/2014 10:47 AM) Breast Biopsy Right.  Diagnostic Studies History Marjean Donna, CMA; 12/28/2014 10:47 AM) Mammogram within last year Pap Smear 1-5 years ago  Allergies Marjean Donna, CMA; 12/28/2014 10:48 AM) Tape 1"X5yd *MEDICAL DEVICES AND SUPPLIES*  Medication History Davy Pique Bynum, CMA; 12/28/2014 10:49 AM) LORazepam (0.5MG  Tablet, Oral) Active. Venlafaxine HCl ER (37.5MG  Capsule ER 24HR, Oral) Active. Metoprolol Tartrate (50MG  Tablet, Oral) Active. Lidocaine-Prilocaine (2.5-2.5% Cream, External) Active. Atorvastatin Calcium (40MG  Tablet, Oral) Active. Medications Reconciled  Social History Marjean Donna, CMA; 12/28/2014 10:47 AM) Alcohol use Recently quit alcohol use. Caffeine use Carbonated beverages, Coffee, Tea. No drug use Tobacco use Former smoker.  Family History Marjean Donna, [place]; 12/28/2014 10:47 AM) Cerebrovascular Accident Mother, Sister. Hypertension Mother, Sister. Thyroid problems Sister.  Pregnancy / Birth History Marjean Donna, [place]; 12/28/2014 10:47 AM) Age at  menarche 78 years. Age of menopause <43 Gravida 3 Irregular periods Maternal age 75-20 Para 2  Review of Systems ([place]; 12/28/2014 10:47 AM) General Present- Fatigue. Not Present- Appetite Loss, Chills, Fever, Night Sweats, Weight Gain and Weight Loss. Skin Present- Dryness. Not Present- Change in Wart/Mole, Hives, Jaundice, New Lesions, Non-Healing Wounds, Rash and Ulcer. HEENT Present- Seasonal Allergies and Wears glasses/contact lenses. Not Present- Earache, Hearing Loss, Hoarseness, Nose Bleed, Oral Ulcers, Ringing in the Ears, Sinus Pain, Sore Throat, Visual Disturbances and Yellow Eyes. Respiratory Present- Chronic Cough. Not Present- Bloody sputum, Difficulty Breathing, Snoring and Wheezing. Breast Present- Breast Mass. Not Present- Breast Pain, Nipple Discharge and Skin Changes. Cardiovascular Present- Leg Cramps. Not Present- Chest Pain, Difficulty Breathing Lying Down, Palpitations, Rapid Heart Rate, Shortness of Breath and Swelling of Extremities. Gastrointestinal Present- Constipation and Nausea. Not Present- Abdominal Pain, Bloating, Bloody Stool, Change in Bowel Habits, Chronic diarrhea, Difficulty Swallowing, Excessive gas, Gets full quickly at meals, Hemorrhoids, Indigestion, Rectal Pain and Vomiting. Female Genitourinary Not Present- Frequency, Nocturia, Painful Urination, Pelvic Pain and Urgency. Musculoskeletal Present- Muscle Pain. Not Present- Back Pain, Joint Pain, Joint Stiffness, Muscle Weakness and Swelling of Extremities. Neurological Present- Numbness. Not Present- Decreased Memory, Fainting, Headaches, Seizures, Tingling, Tremor, Trouble walking and Weakness. Psychiatric Present- Change in Sleep Pattern. Not Present- Anxiety, Bipolar, Depression, Fearful and Frequent crying. Endocrine Present- Hot flashes. Not Present- Cold Intolerance, Excessive Hunger, Hair Changes, Heat Intolerance and New Diabetes. Hematology Not Present- Easy Bruising, Excessive  bleeding, Gland problems, HIV and Persistent Infections.   Vitals (Sonya Bynum CMA; 12/28/2014 10:48 AM) 12/28/2014 10:47 AM Weight: 188 lb Height: 65in Body Surface Area: 1.98 m Body Mass Index: 31.28 kg/m Temp.: 97.78F(Temporal)  Pulse: 83 (Regular)  BP:  134/82 (Sitting, Left Arm, Standard)    Physical Exam Rolm Bookbinder MD; 12/28/2014 1:46 PM) General Mental Status-Alert. Orientation-Oriented X3.  Chest and Lung Exam Chest and lung exam reveals -on auscultation, normal breath sounds, no adventitious sounds and normal vocal resonance.  Breast Nipples-No Discharge. Breast Lump-No Palpable Breast Mass.  Cardiovascular Cardiovascular examination reveals -normal heart sounds, regular rate and rhythm with no murmurs.  Lymphatic Head & Neck  General Head & Neck Lymphatics: Bilateral - Description - Normal. Axillary  General Axillary Region: Bilateral - Description - Normal. Note: no [place] adenopathy     Assessment & Plan Rolm Bookbinder MD; 12/28/2014 1:45 PM) CANCER OF UPPER-OUTER QUADRANT OF FEMALE BREAST (174.4  C50.419)  right breast radioactive seed guided lumpectomy, right breast targeted axillary node removal (sentinel node plus excision of abnl node at beginning) We discussed the options for treatment of the breast cancer which included lumpectomy versus a mastectomy. We discussed the performance of the lumpectomy with radioactive seed placement. We discussed a 5-10% chance of a positive margin requiring reexcision in the operating room. We also discussed that she will need radiation therapy (this is usually 5-7 weeks) if she undergoes lumpectomy. The breast cannot undergo more radiation therapy in the same breast after lumpectomy in the future. We discussed the mastectomy (removal of whole breast) and the postoperative care for that as well. Mastectomy can be followed by reconstruction. This is a more extensive surgery and requires more  recovery. she will likely get radiotherapy after mastectomy due to node. We discussed that there is no difference in her survival whether she undergoes lumpectomy with radiation therapy or antiestrogen therapy versus a mastectomy. There is also no real difference between her recurrence in the breast. We discussed she now has clinically negative nodes and I would like to enroll in alliance trial. we discussed trial schema and that is node positive she would end up being randomized intraop and could have alnd. she understands that and thinks she would like to participate. I will have her see research nurses. will schedule for 3-4 weeks after finishing chemo. we discussed genetics and no role for prophylactic surgery at this point She has elected not to enroll on trial and would like to proceed with targeted axillary excision alone and we will await final path results prior to any further decisions.

## 2015-01-16 NOTE — Discharge Instructions (Signed)
[facility] °Office Phone Number [phone] ° °POST OP INSTRUCTIONS ° °Always review your discharge instruction sheet given to you by the facility where your surgery was performed. ° °IF YOU HAVE DISABILITY OR FAMILY LEAVE FORMS, YOU MUST BRING THEM TO THE OFFICE FOR PROCESSING.  DO NOT GIVE THEM TO YOUR DOCTOR. ° °1. A prescription for pain medication may be given to you upon discharge.  Take your pain medication as prescribed, if needed.  If narcotic pain medicine is not needed, then you may take acetaminophen (Tylenol), naprosyn (Alleve) or ibuprofen (Advil) as needed. °2. Take your usually prescribed medications unless otherwise directed °3. If you need a refill on your pain medication, please contact your pharmacy.  They will contact our office to request authorization.  Prescriptions will not be filled after 5pm or on week-ends. °4. You should eat very light the first 24 hours after surgery, such as soup, crackers, pudding, etc.  Resume your normal diet the day after surgery. °5. Most patients will experience some swelling and bruising in the breast.  Ice packs and a good support bra will help.  Wear the breast binder provided or a sports bra for 72 hours day and night.  After that wear a sports bra during the day until you return to the office. Swelling and bruising can take several days to resolve.  °6. It is common to experience some constipation if taking pain medication after surgery.  Increasing fluid intake and taking a stool softener will usually help or prevent this problem from occurring.  A mild laxative (Milk of Magnesia or Miralax) should be taken according to package directions if there are no bowel movements after 48 hours. °7. Unless discharge instructions indicate otherwise, you may remove your bandages 48 hours after surgery and you may shower at that time.  You may have steri-strips (small skin tapes) in place directly over the incision.  These strips should be left on the  skin for 7-10 days and will come off on their own.  If your surgeon used skin glue on the incision, you may shower in 24 hours.  The glue will flake off over the next 2-3 weeks.  Any sutures or staples will be removed at the office during your follow-up visit. °8. ACTIVITIES:  You may resume regular daily activities (gradually increasing) beginning the next day.  Wearing a good support bra or sports bra minimizes pain and swelling.  You may have sexual intercourse when it is comfortable. °a. You may drive when you no longer are taking prescription pain medication, you can comfortably wear a seatbelt, and you can safely maneuver your car and apply brakes. °b. RETURN TO WORK:  ______________________________________________________________________________________ °9. You should see your doctor in the office for a follow-up appointment approximately two weeks after your surgery.  Your doctor’s nurse will typically make your follow-up appointment when she calls you with your pathology report.  Expect your pathology report 3-4 business days after your surgery.  You may call to check if you do not hear from us after three days. °10. OTHER INSTRUCTIONS: _______________________________________________________________________________________________ _____________________________________________________________________________________________________________________________________ °_____________________________________________________________________________________________________________________________________ °_____________________________________________________________________________________________________________________________________ ° °WHEN TO CALL DR WAKEFIELD: °1. Fever over 101.0 °2. Nausea and/or vomiting. °3. Extreme swelling or bruising. °4. Continued bleeding from incision. °5. Increased pain, redness, or drainage from the incision. ° °The clinic staff is available to answer your questions during regular  business hours.  Please don’t hesitate to call and ask to speak to one of the nurses for clinical concerns.  If   you have a medical emergency, go to the nearest emergency room or call 911.  A surgeon from [facility] is always on call at the hospital. ° °For further questions, please visit [email or web address] ° ° ° °Post Anesthesia Home Care Instructions ° °Activity: °Get plenty of rest for the remainder of the day. A responsible adult should stay with you for 24 hours following the procedure.  °For the next 24 hours, DO NOT: °-Drive a car °-Operate machinery °-Drink alcoholic beverages °-Take any medication unless instructed by your physician °-Make any legal decisions or sign important papers. ° °Meals: °Start with liquid foods such as gelatin or soup. Progress to regular foods as tolerated. Avoid greasy, spicy, heavy foods. If nausea and/or vomiting occur, drink only clear liquids until the nausea and/or vomiting subsides. Call your physician if vomiting continues. ° °Special Instructions/Symptoms: °Your throat may feel dry or sore from the anesthesia or the breathing tube placed in your throat during surgery. If this causes discomfort, gargle with warm salt water. The discomfort should disappear within 24 hours. ° °If you had a scopolamine patch placed behind your ear for the management of post- operative nausea and/or vomiting: ° °1. The medication in the patch is effective for 72 hours, after which it should be removed.  Wrap patch in a tissue and discard in the trash. Wash hands thoroughly with soap and water. °2. You may remove the patch earlier than 72 hours if you experience unpleasant side effects which may include dry mouth, dizziness or visual disturbances. °3. Avoid touching the patch. Wash your hands with soap and water after contact with the patch. °  ° °

## 2015-01-18 ENCOUNTER — Encounter (HOSPITAL_BASED_OUTPATIENT_CLINIC_OR_DEPARTMENT_OTHER): Payer: Self-pay | Admitting: General Surgery

## 2015-01-22 ENCOUNTER — Telehealth: Payer: Self-pay

## 2015-01-22 ENCOUNTER — Encounter: Payer: Self-pay | Admitting: Hematology and Oncology

## 2015-01-22 DIAGNOSIS — C50411 Malignant neoplasm of upper-outer quadrant of right female breast: Secondary | ICD-10-CM

## 2015-01-22 NOTE — Telephone Encounter (Signed)
           Message     Since completing the chemo treatments, I have been very attentive to see side affects go away. However, there are some that i am worried about that are lingering. The tips of my fingers hurt from the joint to the tips of my fingers such that it feels like that part of my fingers are broken. I have to continually shake them and move them in order for the pain to go away. The tips of my fingers are still feel weird. There is still discolor on all my fingernails with a few nails disfigured. I also have pain in my feet, the same as above. My knees, hips, and lower back make me feel like an old lady when I get out of bed. It takes a little while for me to stand up straight and walk normal. My eyelashes are not growing back. My blood pressure is still consistently high, which worries me. They all worry me. Could I go and see a physical therapist or is there anything you would recommend? I just want to get back to normal.

## 2015-01-23 ENCOUNTER — Other Ambulatory Visit: Payer: Self-pay | Admitting: General Surgery

## 2015-01-25 MED ORDER — CEFAZOLIN SODIUM-DEXTROSE 2-3 GM-% IV SOLR: 2.0000 g | INTRAVENOUS | Status: DC

## 2015-01-26 ENCOUNTER — Ambulatory Visit (HOSPITAL_COMMUNITY): Admission: RE | Admit: 2015-01-26 | Payer: Medicaid Other | Source: Ambulatory Visit | Admitting: General Surgery

## 2015-01-26 ENCOUNTER — Encounter (HOSPITAL_COMMUNITY): Admission: RE | Payer: Self-pay | Source: Ambulatory Visit

## 2015-01-26 SURGERY — BREAST LUMPECTOMY WITH EXCISION OF SENTINEL NODE
Anesthesia: General | Site: Breast | Laterality: Right

## 2015-01-26 MED ORDER — GABAPENTIN 100 MG PO CAPS: 100.0000 mg | ORAL_CAPSULE | Freq: Three times a day (TID) | ORAL | Status: DC

## 2015-01-31 ENCOUNTER — Encounter: Payer: Self-pay | Admitting: *Deleted

## 2015-01-31 DIAGNOSIS — C50411 Malignant neoplasm of upper-outer quadrant of right female breast: Secondary | ICD-10-CM

## 2015-01-31 NOTE — Assessment & Plan Note (Signed)
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 Recommendation: 1. AXLND 2. Followed by XRT 3. Foll by Adjuvant anti-estrogen therapy  We are evaluating participation in [facility] clinical trial RTC after XRT

## 2015-02-01 ENCOUNTER — Ambulatory Visit (HOSPITAL_BASED_OUTPATIENT_CLINIC_OR_DEPARTMENT_OTHER): Payer: Medicaid Other | Admitting: Hematology and Oncology

## 2015-02-01 ENCOUNTER — Encounter: Payer: Self-pay | Admitting: Hematology and Oncology

## 2015-02-01 ENCOUNTER — Encounter: Payer: Medicaid Other | Admitting: *Deleted

## 2015-02-01 ENCOUNTER — Other Ambulatory Visit (HOSPITAL_BASED_OUTPATIENT_CLINIC_OR_DEPARTMENT_OTHER): Payer: Medicaid Other

## 2015-02-01 VITALS — BP 135/87 | HR 68 | Temp 97.8°F | Resp 18 | Ht 65.0 in | Wt 185.7 lb

## 2015-02-01 DIAGNOSIS — Z006 Encounter for examination for normal comparison and control in clinical research program: Secondary | ICD-10-CM

## 2015-02-01 DIAGNOSIS — C50411 Malignant neoplasm of upper-outer quadrant of right female breast: Secondary | ICD-10-CM | POA: Diagnosis not present

## 2015-02-01 LAB — CBC WITH DIFFERENTIAL/PLATELET
BASO%: 1.9 % (ref 0.0–2.0)
BASOS ABS: 0.1 10*3/uL (ref 0.0–0.1)
EOS ABS: 0.1 10*3/uL (ref 0.0–0.5)
EOS%: 2.3 % (ref 0.0–7.0)
HEMATOCRIT: 38.6 % (ref 34.8–46.6)
HEMOGLOBIN: 12.9 g/dL (ref 11.6–15.9)
LYMPH#: 0.8 10*3/uL — AB (ref 0.9–3.3)
LYMPH%: 29.7 % (ref 14.0–49.7)
MCH: 29.5 pg (ref 25.1–34.0)
MCHC: 33.4 g/dL (ref 31.5–36.0)
MCV: 88.3 fL (ref 79.5–101.0)
MONO#: 0.3 10*3/uL (ref 0.1–0.9)
MONO%: 10.3 % (ref 0.0–14.0)
NEUT%: 55.8 % (ref 38.4–76.8)
NEUTROS ABS: 1.5 10*3/uL (ref 1.5–6.5)
Platelets: 185 10*3/uL (ref 145–400)
RBC: 4.37 10*6/uL (ref 3.70–5.45)
RDW: 14.1 % (ref 11.2–14.5)
WBC: 2.6 10*3/uL — ABNORMAL LOW (ref 3.9–10.3)

## 2015-02-01 LAB — COMPREHENSIVE METABOLIC PANEL (CC13)
ALBUMIN: 3.9 g/dL (ref 3.5–5.0)
ALT: 69 U/L — AB (ref 0–55)
AST: 44 U/L — AB (ref 5–34)
Alkaline Phosphatase: 92 U/L (ref 40–150)
Anion Gap: 7 mEq/L (ref 3–11)
BUN: 11.9 mg/dL (ref 7.0–26.0)
CALCIUM: 9.3 mg/dL (ref 8.4–10.4)
CO2: 27 mEq/L (ref 22–29)
Chloride: 106 mEq/L (ref 98–109)
Creatinine: 0.9 mg/dL (ref 0.6–1.1)
EGFR: >90 mL/min/{1.73_m2} (ref >90)
Glucose: 99 mg/dl (ref 70–140)
POTASSIUM: 3.7 meq/L (ref 3.5–5.1)
Sodium: 141 mEq/L (ref 136–145)
Total Bilirubin: 0.41 mg/dL (ref 0.20–1.20)
Total Protein: 6.9 g/dL (ref 6.4–8.3)

## 2015-02-01 LAB — RESEARCH LABS

## 2015-02-01 MED ORDER — INV-ATORVASTATIN/PLACEBO 40 MG TABS WAKE FOREST WF 98213: 1.0000 | ORAL_TABLET | Freq: Every day | ORAL | Status: DC

## 2015-02-01 NOTE — Progress Notes (Signed)
02/01/2015 0845  PREVENT study Patient here for the PREVENT 6 month visit.  The nurse assessment and neurocog. tests were done.  Research labs were done. The patient complains of grade 1 muscle/joint aches in her left hip.  She says she feels "like an old lady" when she gets out of bed in the morning.  She has peripheral neuropathy.  She has occasional dizziness with headaches off and on. This RN spoke with Dr. Lindi Adie regarding above symptoms and regarding elevated grade 1 AST and ALT.  Per Dr. Lindi Adie, "I do not think the above symptoms are related to study drug".  "Please re-check ALT and AST in 1 week".  Per Dr. Lindi Adie, patient is to continue taking study drug and will make a decision on continuation after next week's labs.  Patient notified of above. Patient returned study drug with 24 pills left in the bottle.  She said she had 4 more pills at home in her pill box.  She requested to take those 4 pills and then open up her new bottle.  She said she had missed a taking a few pills due to nausea from her chemo.  She was not specific on how many days, but her medication calendars are marked.   She was dispensed a new 6 month bottle of study drug along with calendars July 2016 through Dec. 2016.  Her MRI is scheduled in July, but may have to change the date due to upcoming surgery planned date TBD. Patient understands to call for any increase in symptoms. She sees Dr. Donne Hazel this afternoon and will decide on the [facility] [id number] study.  He was made aware of the elevated ALT and AST. Marcellus Scott, RN

## 2015-02-01 NOTE — Progress Notes (Addendum)
Patient Care Team: No Pcp Per Patient as PCP - General (General Practice) Rolm Bookbinder, MD as Consulting Physician (General Surgery) Nicholas Lose, MD as Consulting Physician (Hematology and Oncology) Thea Silversmith, MD as Consulting Physician (Radiation Oncology) Holley Bouche, NP as Nurse Practitioner (Nurse Practitioner) Carola Frost, RN as Registered Nurse (Oncology)  DIAGNOSIS: Breast cancer of upper-outer quadrant of right female breast   Staging form: Breast, AJCC 7th Edition     Clinical stage from 07/26/2014: Stage IIB (T2, N1, M0) - Unsigned       Staging comments: Staged at breast conference on 12.16.15    SUMMARY OF ONCOLOGIC HISTORY:   Breast cancer of upper-outer quadrant of right female breast   07/13/2014 Mammogram indeterminate mass in the right breast by ultrasound measured 2 cm at 10:00 position   07/20/2014 Initial Diagnosis right breast biopsy: Invasive ductal carcinoma, 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, grade 3,ER/PR positive, HER-2 equivocal ratio 1.55, gene copy #4.35 (being repeated), Ki-67 40%   08/09/2014 - 12/19/2014 Neo-Adjuvant Chemotherapy Dose dense Adriamycin and Cytoxan 4 followed by Taxol 12    Procedure Genetic testing revealed three variant's of unknown significance - MRE11A c.256G>C, PMS2 c.1004A>G, and RAD51C c.200A>G - but was otherwise normal, and did not reveal a deleterious mutation in these genes   01/16/2015 Surgery Rt Lumpectomy: 1.2 cm IDC 1/1 LN Positive eith ECE, Grade 3,Er 100%, PR 15%, Her 2 neg: 1.13 T1cN1M0 Stage 2A     CHIEF COMPLIANT: Patient here to discuss surgery reports and treatment plan  INTERVAL HISTORY: Barbara Myers is a 39 year old with above-mentioned history of right breast cancer treated with new adjuvant chemotherapy. She underwent lumpectomy on 01/16/2015. She had one positive axillary lymph node and 1 positive margin. There is a plan to take her back to surgery for  reresection of the margin as well as for axillary lymph node dissection. She was presented at the multidisciplinary tumor board and she is here today to discuss overall plan. She is healing very well from prior surgery and is awaiting insurance approval to get the second surgery.   REVIEW OF SYSTEMS:   Constitutional: Denies fevers, chills or abnormal weight loss Eyes: Denies blurriness of vision Ears, nose, mouth, throat, and face: Denies mucositis or sore throat Respiratory: Denies cough, dyspnea or wheezes Cardiovascular: Denies palpitation, chest discomfort or lower extremity swelling Gastrointestinal:  Denies nausea, heartburn or change in bowel habits Skin: Denies abnormal skin rashes Lymphatics: Denies new lymphadenopathy or easy bruising Neurological:Denies numbness, tingling or new weaknesses Behavioral/Psych: Mood is stable, no new changes  Breast:  denies any pain or lumps or nodules in either breasts All other systems were reviewed with the patient and are negative.  I have reviewed the past medical history, past surgical history, social history and family history with the patient and they are unchanged from previous note.  ALLERGIES:  is allergic to tape.  MEDICATIONS:  Current Outpatient Prescriptions  Medication Sig Dispense Refill  . Atorvastatin Calcium (INVESTIGATIONAL ATORVASTATIN/PLACEBO) 40 MG tablet [facility] [id number] Take 1 tablet by mouth daily. Take 2 doses (these doses must be 12 hours apart) prior to first chemotherapy treatment. Then take 1 tablet daily by mouth with or without food. 180 tablet 0  . gabapentin (NEURONTIN) 100 MG capsule Take 1 capsule (100 mg total) by mouth 3 (three) times daily. 90 capsule 0  . LORazepam (ATIVAN) 0.5 MG tablet Take 1 tablet (0.5 mg total) by mouth every 6 (six)  hours as needed (Nausea or vomiting). 30 tablet 3  . metoprolol (LOPRESSOR) 50 MG tablet Take 1 tablet (50 mg total) by mouth 2 (two) times daily. 30 tablet 3  .  oxyCODONE-acetaminophen (PERCOCET) 10-325 MG per tablet Take 1 tablet by mouth every 6 (six) hours as needed for pain. 20 tablet 0  . ranitidine (ZANTAC) 300 MG tablet Take 1 tablet (300 mg total) by mouth at bedtime. 30 tablet 0   No current facility-administered medications for this visit.    PHYSICAL EXAMINATION: ECOG PERFORMANCE STATUS: 1 - Symptomatic but completely ambulatory  Filed Vitals:   02/01/15 0839  BP: 135/87  Pulse: 68  Temp: 97.8 F (36.6 C)  Resp: 18   Filed Weights   02/01/15 0839  Weight: 185 lb 11.2 oz (84.233 kg)    GENERAL:alert, no distress and comfortable SKIN: skin color, texture, turgor are normal, no rashes or significant lesions EYES: normal, Conjunctiva are pink and non-injected, sclera clear OROPHARYNX:no exudate, no erythema and lips, buccal mucosa, and tongue normal  NECK: supple, thyroid normal size, non-tender, without nodularity LYMPH:  no palpable lymphadenopathy in the cervical, axillary or inguinal LUNGS: clear to auscultation and percussion with normal breathing effort HEART: regular rate & rhythm and no murmurs and no lower extremity edema ABDOMEN:abdomen soft, non-tender and normal bowel sounds Musculoskeletal:no cyanosis of digits and no clubbing  NEURO: alert & oriented x 3 with fluent speech, no focal motor/sensory deficits  LABORATORY DATA:  I have reviewed the data as listed   Chemistry      Component Value Date/Time   NA 142 12/19/2014 1005   NA 138 01/22/2011 2330   K 3.8 12/19/2014 1005   K 3.6 01/22/2011 2330   CL 104 01/22/2011 2330   CO2 24 12/19/2014 1005   CO2 28 01/22/2011 2330   BUN 8.8 12/19/2014 1005   BUN 9 01/22/2011 2330   CREATININE 0.8 12/19/2014 1005   CREATININE 0.72 01/22/2011 2330      Component Value Date/Time   CALCIUM 8.8 12/19/2014 1005   CALCIUM 9.2 01/22/2011 2330   ALKPHOS 61 12/19/2014 1005   ALKPHOS 55 01/22/2011 2330   AST 19 12/19/2014 1005   AST 21 01/22/2011 2330   ALT 28  12/19/2014 1005   ALT 15 01/22/2011 2330   BILITOT 0.40 12/19/2014 1005   BILITOT 0.2* 01/22/2011 2330       Lab Results  Component Value Date   WBC 4.5 12/19/2014   HGB 10.8* 12/19/2014   HCT 32.8* 12/19/2014   MCV 93.2 12/19/2014   PLT 296 12/19/2014   NEUTROABS 3.2 12/19/2014    ASSESSMENT & PLAN:  Breast cancer of upper-outer quadrant of right female breast Rt Lumpectomy 01/16/15: 1.2 cm IDC 1/1 LN Positive eith ECE, Grade 3,Er 100%, PR 15%, Her 2 neg: 1.13 T1cN1M0 Stage 2A  (Pre-Op:Right breast invasive ductal carcinoma 2 cm by ultrasound one axillary lymph node biopsy positive, ER positive, PR positive, HER-2 equivocal [employer] HER 2 Negative, grade 3, Ki-67 40%, T2 N1 M0 stage IIB clinical stage) S/P AC X 4 foll by Taxol X 12  Path review: Discussed the path report and provided a copy for the patient. Recommendation: 1. AXLND 2. Followed by XRT 3. Foll by Adjuvant anti-estrogen therapy  Pain in the fingers: Short lasting 30-60 seconds and goes away multiple times throughout the day. Neuropathy itself is getting better with decrease in numbness and tingling on Neurontin.  We are evaluating participation in [facility]ANCE clinical trial RTC after  XRT   No orders of the defined types were placed in this encounter.   The patient has a good understanding of the overall plan. she agrees with it. she will call with any problems that may develop before the next visit here.   Rulon Eisenmenger, MD     Addendum: The side effects reported above which include the pain in the fingers are not related to the study drug (atorvastatin versus placebo)

## 2015-02-02 ENCOUNTER — Encounter: Payer: Self-pay | Admitting: *Deleted

## 2015-02-05 ENCOUNTER — Telehealth: Payer: Self-pay

## 2015-02-05 NOTE — Telephone Encounter (Signed)
Ultrasound and mammogram results dtd 01/15/15 and 01/16/15 rcvd from [facility]/  Reviewed by Dr. Lindi Adie, Sent to scan.

## 2015-02-06 ENCOUNTER — Other Ambulatory Visit: Payer: Self-pay | Admitting: *Deleted

## 2015-02-06 ENCOUNTER — Encounter (HOSPITAL_BASED_OUTPATIENT_CLINIC_OR_DEPARTMENT_OTHER): Payer: Self-pay | Admitting: *Deleted

## 2015-02-06 DIAGNOSIS — C50411 Malignant neoplasm of upper-outer quadrant of right female breast: Secondary | ICD-10-CM

## 2015-02-07 ENCOUNTER — Other Ambulatory Visit: Payer: Self-pay | Admitting: Hematology and Oncology

## 2015-02-07 ENCOUNTER — Telehealth: Payer: Self-pay | Admitting: *Deleted

## 2015-02-07 ENCOUNTER — Other Ambulatory Visit: Payer: Self-pay | Admitting: Nurse Practitioner

## 2015-02-07 ENCOUNTER — Other Ambulatory Visit: Payer: Self-pay | Admitting: *Deleted

## 2015-02-07 DIAGNOSIS — C50411 Malignant neoplasm of upper-outer quadrant of right female breast: Secondary | ICD-10-CM

## 2015-02-07 NOTE — Telephone Encounter (Signed)
Too early for fill

## 2015-02-07 NOTE — Telephone Encounter (Signed)
02/07/2015 6213  PREVENT study Spoke with Hinton Dyer in pre-op area at [facility].  Requested the patient's CMET ordered by Dr. Lindi Adie for 02/08/15 be drawn while at surgery center.  Explained the result is needed for the PREVENT research study (Atorvastatin vs. Placebo) that the patient is participating on.  Hinton Dyer agreed and said no problem as long as order is in EPIC.  This RN confirmed the order is in [place]. Marcellus Scott, RN Clinical Research

## 2015-02-07 NOTE — Telephone Encounter (Signed)
Called to verify with pt how many Gabapentin she has left being it's not quite time to fill it. No answer. Rx was last filled on 01/26/15. Left detailed message for pt to call this nurse back @ [phone]. Left two messages for pt on VM.

## 2015-02-08 ENCOUNTER — Encounter (HOSPITAL_BASED_OUTPATIENT_CLINIC_OR_DEPARTMENT_OTHER): Payer: Self-pay | Admitting: *Deleted

## 2015-02-08 ENCOUNTER — Ambulatory Visit (HOSPITAL_BASED_OUTPATIENT_CLINIC_OR_DEPARTMENT_OTHER)
Admission: RE | Admit: 2015-02-08 | Discharge: 2015-02-09 | Disposition: A | Discharge status: Home / Self Care | Payer: Medicaid Other | Source: Ambulatory Visit | Attending: General Surgery | Admitting: General Surgery

## 2015-02-08 ENCOUNTER — Encounter: Payer: Medicaid Other | Admitting: *Deleted

## 2015-02-08 ENCOUNTER — Encounter (HOSPITAL_BASED_OUTPATIENT_CLINIC_OR_DEPARTMENT_OTHER)
Admission: RE | Disposition: A | Discharge status: Home / Self Care | Payer: Self-pay | Source: Ambulatory Visit | Attending: General Surgery

## 2015-02-08 ENCOUNTER — Other Ambulatory Visit: Payer: Medicaid Other

## 2015-02-08 ENCOUNTER — Ambulatory Visit (HOSPITAL_BASED_OUTPATIENT_CLINIC_OR_DEPARTMENT_OTHER): Payer: Medicaid Other | Admitting: Certified Registered Nurse Anesthetist

## 2015-02-08 DIAGNOSIS — F419 Anxiety disorder, unspecified: Secondary | ICD-10-CM | POA: Insufficient documentation

## 2015-02-08 DIAGNOSIS — I10 Essential (primary) hypertension: Secondary | ICD-10-CM | POA: Diagnosis not present

## 2015-02-08 DIAGNOSIS — C773 Secondary and unspecified malignant neoplasm of axilla and upper limb lymph nodes: Secondary | ICD-10-CM | POA: Diagnosis not present

## 2015-02-08 DIAGNOSIS — Z87891 Personal history of nicotine dependence: Secondary | ICD-10-CM | POA: Insufficient documentation

## 2015-02-08 DIAGNOSIS — C50911 Malignant neoplasm of unspecified site of right female breast: Secondary | ICD-10-CM | POA: Diagnosis present

## 2015-02-08 DIAGNOSIS — Z853 Personal history of malignant neoplasm of breast: Secondary | ICD-10-CM | POA: Diagnosis not present

## 2015-02-08 DIAGNOSIS — Z79899 Other long term (current) drug therapy: Secondary | ICD-10-CM | POA: Diagnosis not present

## 2015-02-08 DIAGNOSIS — C50919 Malignant neoplasm of unspecified site of unspecified female breast: Secondary | ICD-10-CM | POA: Diagnosis present

## 2015-02-08 DIAGNOSIS — Z9221 Personal history of antineoplastic chemotherapy: Secondary | ICD-10-CM | POA: Diagnosis not present

## 2015-02-08 HISTORY — PX: BREAST LUMPECTOMY WITH AXILLARY LYMPH NODE DISSECTION: SHX5756

## 2015-02-08 HISTORY — PX: PORT-A-CATH REMOVAL: SHX5289

## 2015-02-08 LAB — COMPREHENSIVE METABOLIC PANEL
ALT: 38 U/L (ref 14–54)
AST: 24 U/L (ref 15–41)
Albumin: 3.6 g/dL (ref 3.5–5.0)
Alkaline Phosphatase: 65 U/L (ref 38–126)
Anion gap: 6 (ref 5–15)
BUN: 9 mg/dL (ref 6–20)
CO2: 27 mmol/L (ref 22–32)
CREATININE: 0.81 mg/dL (ref 0.44–1.00)
Calcium: 8.8 mg/dL — ABNORMAL LOW (ref 8.9–10.3)
Chloride: 105 mmol/L (ref 101–111)
GFR calc non Af Amer: >60 mL/min (ref >60)
GLUCOSE: 84 mg/dL (ref 65–99)
Potassium: 3.9 mmol/L (ref 3.5–5.1)
SODIUM: 138 mmol/L (ref 135–145)
Total Bilirubin: 0.6 mg/dL (ref 0.3–1.2)
Total Protein: 6 g/dL — ABNORMAL LOW (ref 6.5–8.1)

## 2015-02-08 LAB — POCT HEMOGLOBIN-HEMACUE: Hemoglobin: 12.2 g/dL (ref 12.0–15.0)

## 2015-02-08 SURGERY — BREAST LUMPECTOMY WITH AXILLARY LYMPH NODE DISSECTION
Anesthesia: Regional | Laterality: Right

## 2015-02-08 MED ORDER — HYDROMORPHONE HCL 1 MG/ML IJ SOLN
0.2500 mg | INTRAMUSCULAR | Status: DC | PRN
  Administered 2015-02-08 (×4): 0.5 mg via INTRAVENOUS

## 2015-02-08 MED ORDER — MORPHINE SULFATE 2 MG/ML IJ SOLN
2.0000 mg | INTRAMUSCULAR | Status: DC | PRN
  Administered 2015-02-08: 2 mg via INTRAVENOUS
  Filled 2015-02-08 (×2): qty 1

## 2015-02-08 MED ORDER — MIDAZOLAM HCL 2 MG/2ML IJ SOLN
1.0000 mg | INTRAMUSCULAR | Status: DC | PRN
Start: 2015-02-08 — End: 2015-02-08
  Administered 2015-02-08 (×2): 2 mg via INTRAVENOUS

## 2015-02-08 MED ORDER — CEFAZOLIN SODIUM-DEXTROSE 2-3 GM-% IV SOLR
INTRAVENOUS | Status: AC
  Filled 2015-02-08: qty 50

## 2015-02-08 MED ORDER — OXYCODONE-ACETAMINOPHEN 10-325 MG PO TABS: 1.0000 | ORAL_TABLET | Freq: Four times a day (QID) | ORAL | Status: DC | PRN

## 2015-02-08 MED ORDER — GABAPENTIN 100 MG PO CAPS
100.0000 mg | ORAL_CAPSULE | Freq: Three times a day (TID) | ORAL | Status: DC
  Administered 2015-02-08: 100 mg via ORAL

## 2015-02-08 MED ORDER — MIDAZOLAM HCL 2 MG/2ML IJ SOLN
INTRAMUSCULAR | Status: AC
  Filled 2015-02-08: qty 2

## 2015-02-08 MED ORDER — ACETAMINOPHEN 650 MG RE SUPP: 650.0000 mg | Freq: Four times a day (QID) | RECTAL | Status: DC | PRN

## 2015-02-08 MED ORDER — FENTANYL CITRATE (PF) 100 MCG/2ML IJ SOLN
INTRAMUSCULAR | Status: AC
  Filled 2015-02-08: qty 2

## 2015-02-08 MED ORDER — ONDANSETRON HCL 4 MG/2ML IJ SOLN
INTRAMUSCULAR | Status: DC | PRN
  Administered 2015-02-08: 4 mg via INTRAVENOUS

## 2015-02-08 MED ORDER — PROMETHAZINE HCL 25 MG/ML IJ SOLN: 6.2500 mg | INTRAMUSCULAR | Status: DC | PRN

## 2015-02-08 MED ORDER — PROPOFOL 10 MG/ML IV BOLUS
INTRAVENOUS | Status: DC | PRN
  Administered 2015-02-08: 300 mg via INTRAVENOUS

## 2015-02-08 MED ORDER — HYDROMORPHONE HCL 1 MG/ML IJ SOLN
INTRAMUSCULAR | Status: AC
  Filled 2015-02-08: qty 1

## 2015-02-08 MED ORDER — SUFENTANIL CITRATE 50 MCG/ML IV SOLN
INTRAVENOUS | Status: AC
  Filled 2015-02-08: qty 1

## 2015-02-08 MED ORDER — SCOPOLAMINE 1 MG/3DAYS TD PT72: 1.0000 | MEDICATED_PATCH | Freq: Once | TRANSDERMAL | Status: DC | PRN

## 2015-02-08 MED ORDER — FENTANYL CITRATE (PF) 100 MCG/2ML IJ SOLN
50.0000 ug | INTRAMUSCULAR | Status: DC | PRN
  Administered 2015-02-08: 100 ug via INTRAVENOUS

## 2015-02-08 MED ORDER — OXYCODONE-ACETAMINOPHEN 5-325 MG PO TABS
1.0000 | ORAL_TABLET | ORAL | Status: DC | PRN
  Administered 2015-02-08 – 2015-02-09 (×2): 2 via ORAL
  Filled 2015-02-08 (×2): qty 2

## 2015-02-08 MED ORDER — CEFAZOLIN SODIUM-DEXTROSE 2-3 GM-% IV SOLR
INTRAVENOUS | Status: DC | PRN
  Administered 2015-02-08: 2 g via INTRAVENOUS

## 2015-02-08 MED ORDER — SODIUM CHLORIDE 0.9 % IV SOLN
INTRAVENOUS | Status: DC
  Administered 2015-02-08 – 2015-02-09 (×2): via INTRAVENOUS

## 2015-02-08 MED ORDER — SUFENTANIL CITRATE 50 MCG/ML IV SOLN
INTRAVENOUS | Status: DC | PRN
  Administered 2015-02-08 (×2): 10 ug via INTRAVENOUS

## 2015-02-08 MED ORDER — ONDANSETRON HCL 4 MG/2ML IJ SOLN: 4.0000 mg | Freq: Four times a day (QID) | INTRAMUSCULAR | Status: DC | PRN

## 2015-02-08 MED ORDER — LACTATED RINGERS IV SOLN
INTRAVENOUS | Status: DC
  Administered 2015-02-08: 10:00:00 via INTRAVENOUS

## 2015-02-08 MED ORDER — METOPROLOL TARTRATE 50 MG PO TABS
50.0000 mg | ORAL_TABLET | Freq: Two times a day (BID) | ORAL | Status: DC
  Administered 2015-02-08: 50 mg via ORAL

## 2015-02-08 MED ORDER — HYDROMORPHONE HCL 1 MG/ML IJ SOLN
INTRAMUSCULAR | Status: AC
Start: 2015-02-08 — End: 2015-02-08
  Filled 2015-02-08: qty 1

## 2015-02-08 MED ORDER — ACETAMINOPHEN 325 MG PO TABS: 650.0000 mg | ORAL_TABLET | Freq: Four times a day (QID) | ORAL | Status: DC | PRN

## 2015-02-08 MED ORDER — GLYCOPYRROLATE 0.2 MG/ML IJ SOLN: 0.2000 mg | Freq: Once | INTRAMUSCULAR | Status: DC | PRN

## 2015-02-08 MED ORDER — DEXAMETHASONE SODIUM PHOSPHATE 4 MG/ML IJ SOLN
INTRAMUSCULAR | Status: DC | PRN
  Administered 2015-02-08: 10 mg via INTRAVENOUS

## 2015-02-08 SURGICAL SUPPLY — 39 items
BINDER BREAST XLRG (GAUZE/BANDAGES/DRESSINGS) ×4 IMPLANT
BLADE SURG 15 STRL LF DISP TIS (BLADE) ×2 IMPLANT
BLADE SURG 15 STRL SS (BLADE) ×2
CHLORAPREP W/TINT 26ML (MISCELLANEOUS) ×8 IMPLANT
COVER BACK TABLE 60X90IN (DRAPES) ×4 IMPLANT
COVER MAYO STAND STRL (DRAPES) ×4 IMPLANT
DECANTER SPIKE VIAL GLASS SM (MISCELLANEOUS) IMPLANT
DRAPE LAPAROTOMY 100X72 PEDS (DRAPES) IMPLANT
ELECT COATED BLADE 2.86 ST (ELECTRODE) ×4 IMPLANT
ELECT REM PT RETURN 9FT ADLT (ELECTROSURGICAL) ×4
ELECTRODE REM PT RTRN 9FT ADLT (ELECTROSURGICAL) ×2 IMPLANT
GLOVE BIO SURGEON STRL SZ7 (GLOVE) ×8 IMPLANT
GLOVE BIOGEL PI IND STRL 7.0 (GLOVE) ×8 IMPLANT
GLOVE BIOGEL PI IND STRL 7.5 (GLOVE) ×2 IMPLANT
GLOVE BIOGEL PI INDICATOR 7.0 (GLOVE) ×8
GLOVE BIOGEL PI INDICATOR 7.5 (GLOVE) ×2
GLOVE ECLIPSE 6.5 STRL STRAW (GLOVE) ×4 IMPLANT
GLOVE SURG SS PI 7.0 STRL IVOR (GLOVE) ×4 IMPLANT
GOWN STRL REUS W/ TWL LRG LVL3 (GOWN DISPOSABLE) ×6 IMPLANT
GOWN STRL REUS W/TWL LRG LVL3 (GOWN DISPOSABLE) ×6
LIGHT WAVEGUIDE WIDE FLAT (MISCELLANEOUS) ×4 IMPLANT
LIQUID BAND (GAUZE/BANDAGES/DRESSINGS) ×4 IMPLANT
MARKER SKIN DUAL TIP RULER LAB (MISCELLANEOUS) ×4 IMPLANT
NEEDLE HYPO 25X1 1.5 SAFETY (NEEDLE) ×4 IMPLANT
PACK BASIN DAY SURGERY FS (CUSTOM PROCEDURE TRAY) ×4 IMPLANT
PENCIL BUTTON HOLSTER BLD 10FT (ELECTRODE) ×4 IMPLANT
PIN SAFETY STERILE (MISCELLANEOUS) IMPLANT
SLEEVE SCD COMPRESS KNEE MED (MISCELLANEOUS) ×4 IMPLANT
STAPLER VISISTAT 35W (STAPLE) ×4 IMPLANT
SUT ETHILON 2 0 FS 18 (SUTURE) ×4 IMPLANT
SUT MNCRL AB 4-0 PS2 18 (SUTURE) ×8 IMPLANT
SUT MON AB 4-0 PC3 18 (SUTURE) ×8 IMPLANT
SUT VIC AB 2-0 SH 27 (SUTURE) ×8
SUT VIC AB 2-0 SH 27XBRD (SUTURE) ×8 IMPLANT
SUT VIC AB 3-0 SH 27 (SUTURE) ×6
SUT VIC AB 3-0 SH 27X BRD (SUTURE) ×6 IMPLANT
SYR CONTROL 10ML LL (SYRINGE) ×4 IMPLANT
TOWEL OR 17X24 6PK STRL BLUE (TOWEL DISPOSABLE) ×4 IMPLANT
TOWEL OR NON WOVEN STRL DISP B (DISPOSABLE) ×4 IMPLANT

## 2015-02-08 NOTE — Transfer of Care (Signed)
Immediate Anesthesia Transfer of Care Note  Patient: Barbara Myers  Procedure(s) Performed: Procedure(s): RIGHT AXILLARY LYMPH NODE DISECTION, RIGHT RE-EXCISION OF MARGIN (Right) REMOVAL PORT-A-CATH (Left)  Patient Location: PACU  Anesthesia Type:General and Regional  Level of Consciousness: awake, alert  and oriented  Airway & Oxygen Therapy: Patient Spontanous Breathing and Patient connected to face mask oxygen  Post-op Assessment: Report given to RN and Post -op Vital signs reviewed and stable  Post vital signs: Reviewed and stable  Last Vitals:  Filed Vitals:   02/08/15 1243  BP:   Pulse: 58  Resp: 9    Complications: No apparent anesthesia complications

## 2015-02-08 NOTE — Anesthesia Procedure Notes (Signed)
Procedure Name: LMA Insertion Date/Time: 02/08/2015 11:20 AM Performed by: Melynda Ripple D Pre-anesthesia Checklist: Patient identified, Emergency Drugs available, Suction available and Patient being monitored Patient Re-evaluated:Patient Re-evaluated prior to inductionOxygen Delivery Method: Circle System Utilized Preoxygenation: Pre-oxygenation with 100% oxygen Intubation Type: IV induction Ventilation: Mask ventilation without difficulty LMA: LMA inserted LMA Size: 4.0 Number of attempts: 1 Airway Equipment and Method: Bite block Placement Confirmation: positive ETCO2 Tube secured with: Tape Dental Injury: Teeth and Oropharynx as per pre-operative assessment

## 2015-02-08 NOTE — Discharge Instructions (Signed)
[place] Office Phone Number [phone]   POST OP INSTRUCTIONS  Always review your discharge instruction sheet given to you by the facility where your surgery was performed.  IF YOU HAVE DISABILITY OR FAMILY LEAVE FORMS, YOU MUST BRING THEM TO THE OFFICE FOR PROCESSING.  DO NOT GIVE THEM TO YOUR DOCTOR.  1. A prescription for pain medication may be given to you upon discharge.  Take your pain medication as prescribed, if needed.  If narcotic pain medicine is not needed, then you may take acetaminophen (Tylenol), naprosyn (Alleve) or ibuprofen (Advil) as needed. 2. Take your usually prescribed medications unless otherwise directed 3. If you need a refill on your pain medication, please contact your pharmacy.  They will contact our office to request authorization.  Prescriptions will not be filled after 5pm or on week-ends. 4. You should eat very light the first 24 hours after surgery, such as soup, crackers, pudding, etc.  Resume your normal diet the day after surgery. 5. Most patients will experience some swelling and bruising in the breast.  Ice packs and a good support bra will help.  Wear the breast binder provided or a sports bra for 72 hours day and night.  After that wear a sports bra during the day until you return to the office. Swelling and bruising can take several days to resolve.  6. It is common to experience some constipation if taking pain medication after surgery.  Increasing fluid intake and taking a stool softener will usually help or prevent this problem from occurring.  A mild laxative (Milk of Magnesia or Miralax) should be taken according to package directions if there are no bowel movements after 48 hours. 7. Unless discharge instructions indicate otherwise, you may remove your bandages 48 hours after surgery and you may shower at that time.  You may have steri-strips (small skin tapes) in place directly over the incision.  These strips should be left on the  skin for 7-10 days and will come off on their own.  If your surgeon used skin glue on the incision, you may shower in 24 hours.  The glue will flake off over the next 2-3 weeks.  Any sutures or staples will be removed at the office during your follow-up visit. 8. ACTIVITIES:  You may resume regular daily activities (gradually increasing) beginning the next day.  Wearing a good support bra or sports bra minimizes pain and swelling.  You may have sexual intercourse when it is comfortable. a. You may drive when you no longer are taking prescription pain medication, you can comfortably wear a seatbelt, and you can safely maneuver your car and apply brakes. b. RETURN TO WORK:  ______________________________________________________________________________________ 9. You should see your doctor in the office for a follow-up appointment approximately two weeks after your surgery.  Your doctors nurse will typically make your follow-up appointment when she calls you with your pathology report.  Expect your pathology report 3-4 business days after your surgery.  You may call to check if you do not hear from [place] after three days. 10. OTHER INSTRUCTIONS: _______________________________________________________________________________________________ _____________________________________________________________________________________________________________________________________ _____________________________________________________________________________________________________________________________________ _____________________________________________________________________________________________________________________________________  WHEN TO CALL DR WAKEFIELD: 1. Fever over 101.0 2. Nausea and/or vomiting. 3. Extreme swelling or bruising. 4. Continued bleeding from incision. 5. Increased pain, redness, or drainage from the incision.  The clinic staff is available to answer your questions during regular  business hours.  Please dont hesitate to call and ask to speak to one of the nurses for clinical concerns.  If you have a medical emergency, go to the nearest emergency room or call 911.  A surgeon from [facility] is always on call at the hospital.  For further questions, please visit [email or web address]  .    Post Anesthesia Home Care Instructions  Activity: Get plenty of rest for the remainder of the day. A responsible adult should stay with you for 24 hours following the procedure.  For the next 24 hours, DO NOT: -Drive a car -Paediatric nurse -Drink alcoholic beverages -Take any medication unless instructed by your physician -Make any legal decisions or sign important papers.  Meals: Start with liquid foods such as gelatin or soup. Progress to regular foods as tolerated. Avoid greasy, spicy, heavy foods. If nausea and/or vomiting occur, drink only clear liquids until the nausea and/or vomiting subsides. Call your physician if vomiting continues.  Special Instructions/Symptoms: Your throat may feel dry or sore from the anesthesia or the breathing tube placed in your throat during surgery. If this causes discomfort, gargle with warm salt water. The discomfort should disappear within 24 hours.  If you had a scopolamine patch placed behind your ear for the management of post- operative nausea and/or vomiting:  1. The medication in the patch is effective for 72 hours, after which it should be removed.  Wrap patch in a tissue and discard in the trash. Wash hands thoroughly with soap and water. 2. You may remove the patch earlier than 72 hours if you experience unpleasant side effects which may include dry mouth, dizziness or visual disturbances. 3. Avoid touching the patch. Wash your hands with soap and water after contact with the patch.   Call your surgeon if you experience:   1.  Fever over 101.0. 2.  Inability to urinate. 3.  Nausea and/or  vomiting. 4.  Extreme swelling or bruising at the surgical site. 5.  Continued bleeding from the incision. 6.  Increased pain, redness or drainage from the incision. 7.  Problems related to your pain medication. 8. Any change in color, movement and/or sensation 9. Any problems and/or concerns   About my Jackson-Pratt Bulb Drain  What is a Jackson-Pratt bulb? A Jackson-Pratt is a soft, round device used to collect drainage. It is connected to a long, thin drainage catheter, which is held in place by one or two small stiches near your surgical incision site. When the bulb is squeezed, it forms a vacuum, forcing the drainage to empty into the bulb.  Emptying the Jackson-Pratt bulb- To empty the bulb: 1. Release the plug on the top of the bulb. 2. Pour the bulb's contents into a measuring container which your nurse will provide. 3. Record the time emptied and amount of drainage. Empty the drain(s) as often as your     doctor or nurse recommends.  Date                  Time                    Amount (Drain 1)                 Amount (Drain 2)  _____________________________________________________________________  _____________________________________________________________________  _____________________________________________________________________  _____________________________________________________________________  _____________________________________________________________________  _____________________________________________________________________  _____________________________________________________________________  _____________________________________________________________________  Squeezing the Jackson-Pratt Bulb- To squeeze the bulb: 1. Make sure the plug at the top of the bulb is open. 2. Squeeze the bulb tightly in your fist. You will hear air squeezing from the bulb. 3. Replace the plug  while the bulb is squeezed. 4. Use a safety pin to attach the bulb to  your clothing. This will keep the catheter from     pulling at the bulb insertion site.  When to call your doctor- Call your doctor if:  Drain site becomes red, swollen or hot.  You have a fever greater than 101 degrees F.  There is oozing at the drain site.  Drain falls out (apply a guaze bandage over the drain hole and secure it with tape).  Drainage increases daily not related to activity patterns. (You will usually have more drainage when you are active than when you are resting.)  Drainage has a bad odor.

## 2015-02-08 NOTE — Op Note (Signed)
Preoperative diagnosis: Right breast cancer status post primary chemotherapy with positive margin and positive sentinel node Postoperative diagnosis: Same as above Procedure: #1 left subclavian port removal #2 reexcision of posterior margin status post right lumpectomy #3 right axillary lymph node dissection Surgeon: Dr. Serita Grammes Anesthesia: Gen. With pectoral block Estimated blood loss: Minimal Drains: 44 French Blake drain to right axilla Complications: None Specimens: #1 posterior margin marked with paint #2 right axillary contents Sponge and needle count was correct at completion Disposition to recovery stable  Indications: This a 39 year old female underwent primary chemotherapy followed by breast conservation therapy. She elected to have a sentinel node and wanted to wait on those results prior to any decision making. She has a positive posterior margin. Her repeat HER-2/neu was negative and her sentinel node was positive. We discussed all of her options and decided to proceed with removal of her port, excision of her margin, and a right axillary node dissection. I was unable to enroll her in the Alliance trial.  Procedure: After informed consent was obtained the patient first underwent a pectoral block. She was given cefazolin. Sequential compression devices were on her legs. She was then placed under general anesthesia without complication. Her left chest and right breast and axilla were then prepped and draped in the standard sterile surgical fashion. A surgical timeout was then performed.  I first reentered her old port incision. I then removed the port and the line in their entirety. Hemostasis was observed. I then closed this with 2-0 Vicryl, 3-0 Vicryl, and 4-0 Monocryl. Glue was placed.  I then reentered her old lumpectomy incision. There was a small seroma that was drained. I released all my prior suture. I then excised the posterior margin all the way down to muscle. This  is the new margin. I then marked this with paint. Hemostasis was then observed. I then closed this cavity with 2-0 Vicryl. The skin was closed with 3-0 Vicryl, 4-0 Monocryl, glue and Steri-Strips.  I then made an incision which included her prior sentinel node incision. I carried this through the fascia. There was certainly was some scarring associated with her sentinel node biopsy. Eventually I was able to identify the axillary vein, thoracodorsal nerve, and long thoracic nerve. I swept the axillary contents caudad from the vein taking care to preserve the nerves. I then removed all this in total. Both nerves were functional upon completion. Hemostasis obtained. I placed a 21 [place] Blake drain is secured this with a 2-0 nylon suture. I then closed this with 2-0 Vicryl, 3-0 Vicryl, and 4-0 Monocryl. Benzoin and Steri-Strips was placed over this incision. She tolerated all this well. A breast binder was placed. She was then extubated and transferred to recovery stable.

## 2015-02-08 NOTE — Interval H&P Note (Signed)
History and Physical Interval Note: Have discussed re-excision of positive margin, removal of port due to repeat her2 being negative, unable to enroll in alliance trial so will proceed with standard alnd.  She will not get axillary radiation likely if alnd done in discussion with rad onc unless something unexpected comes up.   02/08/2015 10:59 AM  Barbara Myers  has presented today for surgery, with the diagnosis of RIGHT BREAST CANCER  The various methods of treatment have been discussed with the patient and family. After consideration of risks, benefits and other options for treatment, the patient has consented to  Procedure(s): RIGHT AXILLARY LYMPH NODE DISECTION, RIGHT RE-EXCISION OF MARGIN (Right) REMOVAL PORT-A-CATH (N/A) as a surgical intervention .  The patient's history has been reviewed, patient examined, no change in status, stable for surgery.  I have reviewed the patient's chart and labs.  Questions were answered to the patient's satisfaction.     Nochum Fenter

## 2015-02-08 NOTE — Anesthesia Preprocedure Evaluation (Signed)
Anesthesia Evaluation  Patient identified by MRN, date of birth, ID band Patient awake    Reviewed: Allergy & Precautions, NPO status , Patient's Chart, lab work & pertinent test results  Airway Mallampati: II   Neck ROM: full    Dental  (+) Teeth Intact, Dental Advisory Given   Pulmonary former smoker,  breath sounds clear to auscultation        Cardiovascular hypertension, Rhythm:regular Rate:Normal     Neuro/Psych Anxiety negative neurological ROS     GI/Hepatic negative GI ROS, Neg liver ROS,   Endo/Other  negative endocrine ROSobese  Renal/GU negative Renal ROS     Musculoskeletal   Abdominal   Peds  Hematology   Anesthesia Other Findings   Reproductive/Obstetrics Breast CA                             Anesthesia Physical  Anesthesia Plan  ASA: II  Anesthesia Plan: General and Regional   Post-op Pain Management: MAC Combined w/ Regional for Post-op pain   Induction: Intravenous  Airway Management Planned: LMA  Additional Equipment:   Intra-op Plan:   Post-operative Plan: Extubation in OR  Informed Consent: I have reviewed the patients History and Physical, chart, labs and discussed the procedure including the risks, benefits and alternatives for the proposed anesthesia with the patient or authorized representative who has indicated his/her understanding and acceptance.   Dental advisory given  Plan Discussed with: CRNA, Anesthesiologist and Surgeon  Anesthesia Plan Comments:         Anesthesia Quick Evaluation

## 2015-02-08 NOTE — H&P (View-Only) (Signed)
39 yof who was diagnosed with right breast cancer 12/15. she had palpable right breast mass at that time. no discharge. has undergone genetics that has three vus but otherwise negative. She had mm with density of c. had a 2 cm irregular mass in her right breast. 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 initial mri showed a about a 4.6x2.6 cm mass. she has undergone primary chemotherapy with no real significant side effects that was AC-T. she completed chemo on 5/10 and is here to discuss surgery. She had repeat mri that shows nl nodes, nl left breast, right breast mass is now 3.8x2.4x2.2 cm in size. She did not feel this anymore during chemotherapy and the skin changes are gone from local issues.    Other Problems Marjean Donna, CMA; 12/28/2014 10:47 AM) Breast Cancer High blood pressure  Past Surgical History Marjean Donna, CMA; 12/28/2014 10:47 AM) Breast Biopsy Right.  Diagnostic Studies History Marjean Donna, CMA; 12/28/2014 10:47 AM) Mammogram within last year Pap Smear 1-5 years ago  Allergies Marjean Donna, CMA; 12/28/2014 10:48 AM) Tape 1"X5yd *MEDICAL DEVICES AND SUPPLIES*  Medication History Davy Pique Bynum, CMA; 12/28/2014 10:49 AM) LORazepam (0.5MG  Tablet, Oral) Active. Venlafaxine HCl ER (37.5MG  Capsule ER 24HR, Oral) Active. Metoprolol Tartrate (50MG  Tablet, Oral) Active. Lidocaine-Prilocaine (2.5-2.5% Cream, External) Active. Atorvastatin Calcium (40MG  Tablet, Oral) Active. Medications Reconciled  Social History Marjean Donna, CMA; 12/28/2014 10:47 AM) Alcohol use Recently quit alcohol use. Caffeine use Carbonated beverages, Coffee, Tea. No drug use Tobacco use Former smoker.  Family History Marjean Donna, [place]; 12/28/2014 10:47 AM) Cerebrovascular Accident Mother, Sister. Hypertension Mother, Sister. Thyroid problems Sister.  Pregnancy / Birth History Marjean Donna, [place]; 12/28/2014 10:47 AM) Age at  menarche 27 years. Age of menopause <43 Gravida 3 Irregular periods Maternal age 6-20 Para 2  Review of Systems ([place]; 12/28/2014 10:47 AM) General Present- Fatigue. Not Present- Appetite Loss, Chills, Fever, Night Sweats, Weight Gain and Weight Loss. Skin Present- Dryness. Not Present- Change in Wart/Mole, Hives, Jaundice, New Lesions, Non-Healing Wounds, Rash and Ulcer. HEENT Present- Seasonal Allergies and Wears glasses/contact lenses. Not Present- Earache, Hearing Loss, Hoarseness, Nose Bleed, Oral Ulcers, Ringing in the Ears, Sinus Pain, Sore Throat, Visual Disturbances and Yellow Eyes. Respiratory Present- Chronic Cough. Not Present- Bloody sputum, Difficulty Breathing, Snoring and Wheezing. Breast Present- Breast Mass. Not Present- Breast Pain, Nipple Discharge and Skin Changes. Cardiovascular Present- Leg Cramps. Not Present- Chest Pain, Difficulty Breathing Lying Down, Palpitations, Rapid Heart Rate, Shortness of Breath and Swelling of Extremities. Gastrointestinal Present- Constipation and Nausea. Not Present- Abdominal Pain, Bloating, Bloody Stool, Change in Bowel Habits, Chronic diarrhea, Difficulty Swallowing, Excessive gas, Gets full quickly at meals, Hemorrhoids, Indigestion, Rectal Pain and Vomiting. Female Genitourinary Not Present- Frequency, Nocturia, Painful Urination, Pelvic Pain and Urgency. Musculoskeletal Present- Muscle Pain. Not Present- Back Pain, Joint Pain, Joint Stiffness, Muscle Weakness and Swelling of Extremities. Neurological Present- Numbness. Not Present- Decreased Memory, Fainting, Headaches, Seizures, Tingling, Tremor, Trouble walking and Weakness. Psychiatric Present- Change in Sleep Pattern. Not Present- Anxiety, Bipolar, Depression, Fearful and Frequent crying. Endocrine Present- Hot flashes. Not Present- Cold Intolerance, Excessive Hunger, Hair Changes, Heat Intolerance and New Diabetes. Hematology Not Present- Easy Bruising, Excessive  bleeding, Gland problems, HIV and Persistent Infections.   Vitals (Sonya Bynum CMA; 12/28/2014 10:48 AM) 12/28/2014 10:47 AM Weight: 188 lb Height: 65in Body Surface Area: 1.98 m Body Mass Index: 31.28 kg/m Temp.: 97.28F(Temporal)  Pulse: 83 (Regular)  BP:  134/82 (Sitting, Left Arm, Standard)    Physical Exam Rolm Bookbinder MD; 12/28/2014 1:46 PM) General Mental Status-Alert. Orientation-Oriented X3.  Chest and Lung Exam Chest and lung exam reveals -on auscultation, normal breath sounds, no adventitious sounds and normal vocal resonance.  Breast Nipples-No Discharge. Breast Lump-No Palpable Breast Mass.  Cardiovascular Cardiovascular examination reveals -normal heart sounds, regular rate and rhythm with no murmurs.  Lymphatic Head & Neck  General Head & Neck Lymphatics: Bilateral - Description - Normal. Axillary  General Axillary Region: Bilateral - Description - Normal. Note: no [place] adenopathy     Assessment & Plan Rolm Bookbinder MD; 12/28/2014 1:45 PM) CANCER OF UPPER-OUTER QUADRANT OF FEMALE BREAST (174.4  C50.419)  right breast radioactive seed guided lumpectomy, right breast targeted axillary node removal (sentinel node plus excision of abnl node at beginning) We discussed the options for treatment of the breast cancer which included lumpectomy versus a mastectomy. We discussed the performance of the lumpectomy with radioactive seed placement. We discussed a 5-10% chance of a positive margin requiring reexcision in the operating room. We also discussed that she will need radiation therapy (this is usually 5-7 weeks) if she undergoes lumpectomy. The breast cannot undergo more radiation therapy in the same breast after lumpectomy in the future. We discussed the mastectomy (removal of whole breast) and the postoperative care for that as well. Mastectomy can be followed by reconstruction. This is a more extensive surgery and requires more  recovery. she will likely get radiotherapy after mastectomy due to node. We discussed that there is no difference in her survival whether she undergoes lumpectomy with radiation therapy or antiestrogen therapy versus a mastectomy. There is also no real difference between her recurrence in the breast. We discussed she now has clinically negative nodes and I would like to enroll in alliance trial. we discussed trial schema and that is node positive she would end up being randomized intraop and could have alnd. she understands that and thinks she would like to participate. I will have her see research nurses. will schedule for 3-4 weeks after finishing chemo. we discussed genetics and no role for prophylactic surgery at this point She has elected not to enroll on trial and would like to proceed with targeted axillary excision alone and we will await final path results prior to any further decisions.

## 2015-02-08 NOTE — Anesthesia Postprocedure Evaluation (Signed)
Anesthesia Post Note  Patient: Barbara Myers  Procedure(s) Performed: Procedure(s) (LRB): RIGHT AXILLARY LYMPH NODE DISECTION, RIGHT RE-EXCISION OF MARGIN (Right) REMOVAL PORT-A-CATH (Left)  Anesthesia type: general  Patient location: PACU  Post pain: Pain level controlled  Post assessment: Patient's Cardiovascular Status Stable  Last Vitals:  Filed Vitals:   02/08/15 1354  BP: 167/102  Pulse: 64  Temp: 36.2 C  Resp: 16    Post vital signs: Reviewed and stable  Level of consciousness: sedated  Complications: No apparent anesthesia complications

## 2015-02-08 NOTE — Progress Notes (Signed)
Assisted Dr. Singer with right, ultrasound guided, pectoralis block. Side rails up, monitors on throughout procedure. See vital signs in flow sheet. Tolerated Procedure well. °

## 2015-02-09 ENCOUNTER — Encounter (HOSPITAL_BASED_OUTPATIENT_CLINIC_OR_DEPARTMENT_OTHER): Payer: Self-pay | Admitting: General Surgery

## 2015-02-09 ENCOUNTER — Encounter: Payer: Self-pay | Admitting: *Deleted

## 2015-02-09 DIAGNOSIS — C773 Secondary and unspecified malignant neoplasm of axilla and upper limb lymph nodes: Secondary | ICD-10-CM | POA: Diagnosis not present

## 2015-02-09 LAB — COMPREHENSIVE METABOLIC PANEL
ALK PHOS: 64 U/L (ref 38–126)
ALT: 29 U/L (ref 14–54)
AST: 22 U/L (ref 15–41)
Albumin: 3.2 g/dL — ABNORMAL LOW (ref 3.5–5.0)
Anion gap: 6 (ref 5–15)
BUN: 11 mg/dL (ref 6–20)
CHLORIDE: 105 mmol/L (ref 101–111)
CO2: 26 mmol/L (ref 22–32)
Calcium: 8.8 mg/dL — ABNORMAL LOW (ref 8.9–10.3)
Creatinine, Ser: 0.87 mg/dL (ref 0.44–1.00)
GFR calc Af Amer: >60 mL/min (ref >60)
GFR calc non Af Amer: >60 mL/min (ref >60)
GLUCOSE: 140 mg/dL — AB (ref 65–99)
Potassium: 4.2 mmol/L (ref 3.5–5.1)
Sodium: 137 mmol/L (ref 135–145)
TOTAL PROTEIN: 5.8 g/dL — AB (ref 6.5–8.1)
Total Bilirubin: 0.6 mg/dL (ref 0.3–1.2)

## 2015-02-09 NOTE — Discharge Summary (Signed)
Physician Discharge Summary  Patient ID: Barbara Myers MRN: [id number] DOB/AGE: 11-09-1975 39 y.o.  Admit date: 02/08/2015 Discharge date: 02/09/2015  Admission Diagnoses: Breast cancer  Discharge Diagnoses:  Active Problems:   Breast cancer   Discharged Condition: good  Hospital Course: 82 yof admitted overnight after undergoing alnd, port removal and margin clearance. She is doing well with expected drain output.  Consults: None  Significant Diagnostic Studies: none  Treatments: surgery: port removal, margin excision, alnd  Discharge Exam: Blood pressure 114/73, pulse 61, temperature 98.1 F (36.7 C), resp. rate 18, height 5\' 5"  (1.651 m), weight 84.823 kg (187 lb), SpO2 100 %. drain with expected output  Disposition: 01-Home or Self Care     Medication List    TAKE these medications        gabapentin 100 MG capsule  Commonly known as:  NEURONTIN  Take 1 capsule (100 mg total) by mouth 3 (three) times daily.     Investigational atorvastatin/placebo 40 MG tablet [facility] [phone]  Take 1 tablet by mouth daily. Take 2 doses (these doses must be 12 hours apart) prior to first chemotherapy treatment. Then take 1 tablet daily by mouth with or without food.     Investigational atorvastatin/placebo 40 MG tablet [facility] [phone]  Take 1 tablet by mouth daily. Take 1 tablet daily with or without food.     LORazepam 0.5 MG tablet  Commonly known as:  ATIVAN  TAKE 1 TABLET BY MOUTH EVERY 6 HOURS AS NEEDED FOR NAUSEA AND VOMITING     metoprolol 50 MG tablet  Commonly known as:  LOPRESSOR  Take 1 tablet (50 mg total) by mouth 2 (two) times daily.     oxyCODONE-acetaminophen 10-325 MG per tablet  Commonly known as:  PERCOCET  Take 1 tablet by mouth every 6 (six) hours as needed for pain.     oxyCODONE-acetaminophen 10-325 MG per tablet  Commonly known as:  PERCOCET  Take 1 tablet by mouth every 6 (six) hours as needed for pain.           Follow-up  Information    Follow up with [facility], MD In 1 week.   Specialty:  General Surgery   Contact information:   [address] [place] [address] [phone]       Signed: Rolm Bookbinder 02/09/2015, 7:37 AM

## 2015-02-09 NOTE — Progress Notes (Unsigned)
02/09/2015 0900  PREVENT study Dr. Lindi Adie reviewed the patient's CMET results from 02/09/15 and 02/08/15.  AST and ALT levels are WNL.  No changes to current study drug treatment required, per Dr. Lindi Adie. Barbara Scott, RN Clinical research

## 2015-02-14 ENCOUNTER — Ambulatory Visit (HOSPITAL_COMMUNITY): Admission: RE | Admit: 2015-02-14 | Payer: Medicaid Other | Source: Ambulatory Visit

## 2015-02-14 ENCOUNTER — Encounter: Payer: Medicaid Other | Admitting: *Deleted

## 2015-02-14 ENCOUNTER — Other Ambulatory Visit: Payer: Medicaid Other

## 2015-02-16 ENCOUNTER — Encounter: Payer: Self-pay | Admitting: *Deleted

## 2015-02-16 NOTE — Progress Notes (Signed)
02/16/2015 1030  PREVENT This RN spoke with the patient late in the day on 02/09/15 after Dr. Lindi Adie reviewed the labs (ALT and AST).  She was informed of normal results and that she did not need to discontinue study drug unless there was a change in her symptoms or if she had new symptoms.   This RN  discussed re-scheduling MRI from 02/14/15 to a later date since she had just had surgery.  The patient requested to do the MRI on week of 02/26/15 after her post-op visit with Dr. Donne Hazel on 02/23/15.  MRI was scheduled for 02/28/15. This RN spoke with [name] on 02/16/15 in MRI regarding the patient's scheduled MRI for 02/28/15.  Per Dr. Donne Hazel, "Should be fine to have MRI on 02/28/15, but she did have clips placed in lumpectomy bed and axilla at time of surgery. Many times they want some time to pass before getting mr".  [name] aware of above and called back to say this was discussed with a radiologist who said it would be okay for MRI to occur on 02/28/15.  Per Dr. Lindi Adie, it should be fine. The patient is also due to see Dr. Donne Hazel on 02/23/15.  Barbara Johns, RN to follow-up after MD visit to confirm okay for MRI to take place on 02/28/15.  If patient still has a drain in place, the MRI may have to be re-scheduled. Barbara Scott, RN

## 2015-02-20 ENCOUNTER — Other Ambulatory Visit: Payer: Self-pay | Admitting: Nurse Practitioner

## 2015-02-21 ENCOUNTER — Telehealth: Payer: Self-pay

## 2015-02-21 NOTE — Telephone Encounter (Signed)
LMOVM - Pt to call clinic - need update on neuropathy to determine if dose change needed on gabapentin.

## 2015-02-23 ENCOUNTER — Encounter: Payer: Self-pay | Admitting: *Deleted

## 2015-02-23 ENCOUNTER — Other Ambulatory Visit: Payer: Self-pay | Admitting: *Deleted

## 2015-02-23 DIAGNOSIS — C50411 Malignant neoplasm of upper-outer quadrant of right female breast: Secondary | ICD-10-CM

## 2015-02-23 MED ORDER — GABAPENTIN 100 MG PO CAPS: 100.0000 mg | ORAL_CAPSULE | Freq: Three times a day (TID) | ORAL | Status: DC

## 2015-02-23 NOTE — Progress Notes (Signed)
02/23/15 at 9:39am- PREVENT study/ 6 month cardiac MRI- Dr. Donne Hazel called the research nurse this morning and stated that her breast drain needs to remain in place for now.  He stated that he did not think that the drain would be removed by 02/28/15 (day of pt's scheduled cardiac MRI).  He stated that the pt's cardiac MRI needed to be rescheduled for a later date.  The research nurse stated that the pt's 30 day deadline is on 03/08/15.  Dr. Donne Hazel stated that he can definitely confirm that her breast drain would be removed by 03/08/15.  Therefore, the research nurse contacted, Henrietta Dine, in MRI and canceled the pt's 02/28/15 cardiac MRI.  The PREVENT 6 month cardiac MRI was re-scheduled for 03/08/15 at 9 am.  The research nurse left a message for the pt informing her that her 02/28/15 MRI was rescheduled for 03/08/15 at [facility].  The research nurse asked the pt to return the research nurse's call to confirm that she is aware of her new cardiac MRI appt date and time.  The research nurse will await the pt's return appt.  Brion Aliment RN, BSN, CCRP Clinical Research Nurse 02/23/2015 9:49 AM    02/27/15 at 8:49am- The research nurse called the pt (left voice mail) again to confirm that the pt is aware of her appointment change from 02/28/15 to 03/08/15 at 9 am.  The research nurse specifically asked the pt to return the nurse's call.  Will await the pt's return call. Brion Aliment RN, BSN, CCRP Clinical Research Nurse 02/27/2015 8:52 AM   02/27/15 at 9:04am- The pt called the research nurse back and stated that she did get the message about her MRI appointment change last week.  She apologized for not returning the nurse's call.  The pt said that her appointment on 03/08/15 at 9am is fine with her schedule.   Brion Aliment RN, BSN, CCRP Clinical Research Nurse 02/27/2015 9:06 AM

## 2015-02-24 ENCOUNTER — Encounter: Payer: Self-pay | Admitting: Hematology and Oncology

## 2015-02-28 ENCOUNTER — Inpatient Hospital Stay (HOSPITAL_COMMUNITY): Admission: RE | Admit: 2015-02-28 | Payer: Medicaid Other | Source: Ambulatory Visit

## 2015-03-06 ENCOUNTER — Other Ambulatory Visit: Payer: Self-pay

## 2015-03-06 ENCOUNTER — Encounter: Payer: Self-pay | Admitting: Hematology and Oncology

## 2015-03-06 DIAGNOSIS — C50411 Malignant neoplasm of upper-outer quadrant of right female breast: Secondary | ICD-10-CM

## 2015-03-06 DIAGNOSIS — D500 Iron deficiency anemia secondary to blood loss (chronic): Secondary | ICD-10-CM

## 2015-03-06 MED ORDER — GABAPENTIN 300 MG PO CAPS: 300.0000 mg | ORAL_CAPSULE | Freq: Three times a day (TID) | ORAL | Status: DC

## 2015-03-06 NOTE — Telephone Encounter (Signed)
Responded to pt per MyChart message.  Refill gabapentin sent.  Appt made for lab and injection.  MD notified for orders.

## 2015-03-07 ENCOUNTER — Telehealth: Payer: Self-pay | Admitting: *Deleted

## 2015-03-07 ENCOUNTER — Other Ambulatory Visit: Payer: Self-pay | Admitting: Hematology and Oncology

## 2015-03-07 NOTE — Telephone Encounter (Signed)
03/07/2015 PREVENT study Left voice message twice today asking patient to please return call regarding tomorrow's MRI.  According to note from Dr. Donne Hazel on 02/26/15, the patient had JP drain removed.  This RN called patient to confirm she is aware of MRI scheduled for tomorrow.  This RN left contact name and information for return call.

## 2015-03-08 ENCOUNTER — Ambulatory Visit (HOSPITAL_BASED_OUTPATIENT_CLINIC_OR_DEPARTMENT_OTHER): Payer: Medicaid Other

## 2015-03-08 ENCOUNTER — Ambulatory Visit (HOSPITAL_COMMUNITY)
Admission: RE | Admit: 2015-03-08 | Discharge: 2015-03-08 | Disposition: A | Discharge status: Home / Self Care | Payer: Medicaid Other | Source: Ambulatory Visit | Attending: Hematology and Oncology | Admitting: Hematology and Oncology

## 2015-03-08 ENCOUNTER — Other Ambulatory Visit (HOSPITAL_BASED_OUTPATIENT_CLINIC_OR_DEPARTMENT_OTHER): Payer: Medicaid Other

## 2015-03-08 VITALS — BP 152/98 | HR 63 | Temp 98.4°F

## 2015-03-08 DIAGNOSIS — C50411 Malignant neoplasm of upper-outer quadrant of right female breast: Secondary | ICD-10-CM | POA: Diagnosis not present

## 2015-03-08 DIAGNOSIS — C773 Secondary and unspecified malignant neoplasm of axilla and upper limb lymph nodes: Secondary | ICD-10-CM

## 2015-03-08 DIAGNOSIS — Z5111 Encounter for antineoplastic chemotherapy: Secondary | ICD-10-CM | POA: Diagnosis present

## 2015-03-08 DIAGNOSIS — D500 Iron deficiency anemia secondary to blood loss (chronic): Secondary | ICD-10-CM

## 2015-03-08 LAB — CBC WITH DIFFERENTIAL/PLATELET
BASO%: 1.1 % (ref 0.0–2.0)
BASOS ABS: 0.1 10*3/uL (ref 0.0–0.1)
EOS%: 2.7 % (ref 0.0–7.0)
Eosinophils Absolute: 0.1 10*3/uL (ref 0.0–0.5)
HEMATOCRIT: 32.2 % — AB (ref 34.8–46.6)
HEMOGLOBIN: 10.7 g/dL — AB (ref 11.6–15.9)
LYMPH%: 29 % (ref 14.0–49.7)
MCH: 29.2 pg (ref 25.1–34.0)
MCHC: 33.1 g/dL (ref 31.5–36.0)
MCV: 88.1 fL (ref 79.5–101.0)
MONO#: 0.3 10*3/uL (ref 0.1–0.9)
MONO%: 7.3 % (ref 0.0–14.0)
NEUT%: 59.9 % (ref 38.4–76.8)
NEUTROS ABS: 2.9 10*3/uL (ref 1.5–6.5)
PLATELETS: 326 10*3/uL (ref 145–400)
RBC: 3.66 10*6/uL — ABNORMAL LOW (ref 3.70–5.45)
RDW: 14 % (ref 11.2–14.5)
WBC: 4.8 10*3/uL (ref 3.9–10.3)
lymph#: 1.4 10*3/uL (ref 0.9–3.3)

## 2015-03-08 MED ORDER — GOSERELIN ACETATE 3.6 MG ~~LOC~~ IMPL
3.6000 mg | DRUG_IMPLANT | Freq: Once | SUBCUTANEOUS | Status: AC
  Administered 2015-03-08: 3.6 mg via SUBCUTANEOUS
  Filled 2015-03-08: qty 3.6

## 2015-03-08 NOTE — Patient Instructions (Signed)
Goserelin injection What is this medicine? GOSERELIN (GOE se rel in) is similar to a hormone found in the body. It lowers the amount of sex hormones that the body makes. Men will have lower testosterone levels and women will have lower estrogen levels while taking this medicine. In men, this medicine is used to treat prostate cancer; the injection is either given once per month or once every 12 weeks. A once per month injection (only) is used to treat women with endometriosis, dysfunctional uterine bleeding, or advanced breast cancer. This medicine may be used for other purposes; ask your health care provider or pharmacist if you have questions. COMMON BRAND NAME(S): Zoladex What should I tell my health care provider before I take this medicine? They need to know if you have any of these conditions (some only apply to women): -diabetes -heart disease or previous heart attack -high blood pressure -high cholesterol -kidney disease -osteoporosis or low bone density -problems passing urine -spinal cord injury -stroke -tobacco smoker -an unusual or allergic reaction to goserelin, hormone therapy, other medicines, foods, dyes, or preservatives -pregnant or trying to get pregnant -breast-feeding How should I use this medicine? This medicine is for injection under the skin. It is given by a health care professional in a hospital or clinic setting. Men receive this injection once every 4 weeks or once every 12 weeks. Women will only receive the once every 4 weeks injection. Talk to your pediatrician regarding the use of this medicine in children. Special care may be needed. Overdosage: If you think you have taken too much of this medicine contact a poison control center or emergency room at once. NOTE: This medicine is only for you. Do not share this medicine with others. What if I miss a dose? It is important not to miss your dose. Call your doctor or health care professional if you are unable to  keep an appointment. What may interact with this medicine? -female hormones like estrogen -herbal or dietary supplements like black cohosh, chasteberry, or DHEA -female hormones like testosterone -prasterone This list may not describe all possible interactions. Give your health care provider a list of all the medicines, herbs, non-prescription drugs, or dietary supplements you use. Also tell them if you smoke, drink alcohol, or use illegal drugs. Some items may interact with your medicine. What should I watch for while using this medicine? Visit your doctor or health care professional for regular checks on your progress. Your symptoms may appear to get worse during the first weeks of this therapy. Tell your doctor or healthcare professional if your symptoms do not start to get better or if they get worse after this time. Your bones may get weaker if you take this medicine for a long time. If you smoke or frequently drink alcohol you may increase your risk of bone loss. A family history of osteoporosis, chronic use of drugs for seizures (convulsions), or corticosteroids can also increase your risk of bone loss. Talk to your doctor about how to keep your bones strong. This medicine should stop regular monthly menstration in women. Tell your doctor if you continue to menstrate. Women should not become pregnant while taking this medicine or for 12 weeks after stopping this medicine. Women should inform their doctor if they wish to become pregnant or think they might be pregnant. There is a potential for serious side effects to an unborn child. Talk to your health care professional or pharmacist for more information. Do not breast-feed an infant while taking   this medicine. Men should inform their doctors if they wish to father a child. This medicine may lower sperm counts. Talk to your health care professional or pharmacist for more information. What side effects may I notice from receiving this  medicine? Side effects that you should report to your doctor or health care professional as soon as possible: -allergic reactions like skin rash, itching or hives, swelling of the face, lips, or tongue -bone pain -breathing problems -changes in vision -chest pain -feeling faint or lightheaded, falls -fever, chills -pain, swelling, warmth in the leg -pain, tingling, numbness in the hands or feet -signs and symptoms of low blood pressure like dizziness; feeling faint or lightheaded, falls; unusually weak or tired -stomach pain -swelling of the ankles, feet, hands -trouble passing urine or change in the amount of urine -unusually high or low blood pressure -unusually weak or tired Side effects that usually do not require medical attention (report to your doctor or health care professional if they continue or are bothersome): -change in sex drive or performance -changes in breast size in both males and females -changes in emotions or moods -headache -hot flashes -irritation at site where injected -loss of appetite -skin problems like acne, dry skin -vaginal dryness This list may not describe all possible side effects. Call your doctor for medical advice about side effects. You may report side effects to FDA at 1-800-FDA-1088. Where should I keep my medicine? This drug is given in a hospital or clinic and will not be stored at home. NOTE: This sheet is a summary. It may not cover all possible information. If you have questions about this medicine, talk to your doctor, pharmacist, or health care provider.  2015, [employer]. (2013-10-04 11:10:35)  

## 2015-03-13 ENCOUNTER — Ambulatory Visit: Payer: Medicaid Other | Attending: General Surgery | Admitting: Physical Therapy

## 2015-03-13 DIAGNOSIS — C50919 Malignant neoplasm of unspecified site of unspecified female breast: Secondary | ICD-10-CM | POA: Insufficient documentation

## 2015-03-13 DIAGNOSIS — G620 Drug-induced polyneuropathy: Secondary | ICD-10-CM | POA: Insufficient documentation

## 2015-03-13 DIAGNOSIS — T451X5A Adverse effect of antineoplastic and immunosuppressive drugs, initial encounter: Secondary | ICD-10-CM | POA: Insufficient documentation

## 2015-03-13 DIAGNOSIS — M25611 Stiffness of right shoulder, not elsewhere classified: Secondary | ICD-10-CM | POA: Diagnosis not present

## 2015-03-13 DIAGNOSIS — Z9189 Other specified personal risk factors, not elsewhere classified: Secondary | ICD-10-CM | POA: Diagnosis not present

## 2015-03-13 DIAGNOSIS — M25511 Pain in right shoulder: Secondary | ICD-10-CM | POA: Insufficient documentation

## 2015-03-13 NOTE — Patient Instructions (Signed)
Cane Overhead - Supine  Hold cane at thighs with both hands, extend arms straight over head. Hold __1-2_ seconds. Repeat __5-10_ times. Do _3__ times per day.

## 2015-03-13 NOTE — Therapy (Signed)
[place], [place], [address] Phone: [phone]   Fax:  [phone]  Physical Therapy Evaluation  Patient Details  Name: Barbara Myers MRN: [id number] Date of Birth: [birth year] Referring Provider:  Rolm Bookbinder, MD  Encounter Date: 03/13/2015      PT End of Session - 03/13/15 1739    Visit Number 1   Number of Visits 4   Date for PT Re-Evaluation 05/11/15   PT Start Time 0800   PT Stop Time 0855   PT Time Calculation (min) 55 min   Activity Tolerance Patient tolerated treatment well   Behavior During Therapy [facility] for tasks assessed/performed      Past Medical History  Diagnosis Date  . Anemia   . Breast cancer of upper-outer quadrant of right female breast 07/24/2014  . Anxiety   . Wears glasses   . Hypertension     bp elevated during chemo, not before    Past Surgical History  Procedure Laterality Date  . Wisdom tooth extraction    . Umbilical hernia repair      1990  . Portacath placement N/A 08/08/2014    Procedure: INSERTION PORT-A-CATH left subclavian;  Surgeon: Rolm Bookbinder, MD;  Location: [place];  Service: General;  Laterality: N/A;  . Radioactive seed guided mastectomy with axillary sentinel lymph node biopsy Right 01/16/2015    Procedure: RADIOACTIVE SEED GUIDED PARTIAL MASTECTOMY WITH RIGHT RADIOACTIVE SEED GUIDED AXILLARY SENTINEL LYMPH NODE BIOPSY;  Surgeon: Rolm Bookbinder, MD;  Location: [place];  Service: General;  Laterality: Right;  . Breast lumpectomy with axillary lymph node dissection Right 02/08/2015    Procedure: RIGHT AXILLARY LYMPH NODE DISECTION, RIGHT RE-EXCISION OF MARGIN;  Surgeon: Rolm Bookbinder, MD;  Location: [place];  Service: General;  Laterality: Right;  . Port-a-cath removal Left 02/08/2015    Procedure: REMOVAL PORT-A-CATH;  Surgeon: Rolm Bookbinder, MD;  Location: [place];  Service: General;  Laterality: Left;    There were no vitals filed for this visit.  Visit Diagnosis:  Stiffness of shoulder joint, right  Neuropathy due to chemotherapeutic drug  At risk for lymphedema  Pain in joint, shoulder region, right      Subjective Assessment - 03/13/15 0810    Subjective syptoms of neuropathy in toes and fingers, pain in feet and hip. pain in feet is so bad when she gets out of bed that she has to hold on  to something.    Pertinent History breast cancer dec. 2015, had chemotherapy with some neuropathy symptoms, right lumpecetomy with sentinel node  removed 01/16/2015 with second surgery 6/30 with ALND  Plans to have radiation with intial appt next week    How long can you sit comfortably? has to move occasionally ..pt is constant mover .  25 - 30 minutes    How long can you stand comfortably? more pain in feet with standing especially if she doesn't walk    How long can you walk comfortably? has been too busy with with 2 special needs kids 13 and 15   Patient Stated Goals to" get body back"  to get stronger. and to get range of motion in arm back    Currently in Pain? Yes   Pain Score 3    Pain Location Foot   Pain Orientation Right;Left   Pain Descriptors / Indicators Aching   Pain Type Chronic pain   Pain Onset More than a month ago  Pain Frequency Intermittent   Aggravating Factors  standing and not moving    Pain Relieving Factors move , medicine helps with numbness, doesn't necessariy help with pain    Effect of Pain on Daily Activities yes, because she wants to stop doing everything.  she had to quit her full time job because of it.             [facility] PT Assessment - 03/13/15 0001    Assessment   Medical Diagnosis brease cancaer    Onset Date/Surgical Date 07/12/14   Hand Dominance Right   Precautions   Precautions Other (comment)   Precaution Comments cancer neuropathy   Restrictions   Weight Bearing Restrictions No   Balance  Screen   Has the patient fallen in the past 6 months No   Has the patient had a decrease in activity level because of a fear of falling?  Yes  because of the neuropathy,    Is the patient reluctant to leave their home because of a fear of falling?  No   Home Environment   Living Environment Private residence   Living Arrangements Children;Spouse/significant other  36 and 80 yo special needs kids who are mobile and do adls   Available Help at Discharge Family;Friend(s)   Type of [place] to enter   Entrance Stairs-Number of Steps 5   Entrance Stairs-Rails None;Right;Left  no rail at 2 bottom, pt needs extra time   Home Layout Two level;1/2 bath on main level  has trouble with getting up steps    Home Equipment None   Prior Function   Level of Independence Independent  everything takes longer   Vocation Part time employment  total of about 25 hours per week    [employer] ([place]  has to lift boxes to [place] goes to [place] , read, movies, hang out with friends    Cognition   Overall Cognitive Status Within Functional Limits for tasks assessed   Sit to Stand   Comments 7 sit to stand in 30 seconds with obvious effort    Posture/Postural Control   Posture/Postural Control Postural limitations   Postural Limitations Rounded Shoulders;Forward head   AROM   Right Shoulder Flexion 130 Degrees   Right Shoulder ABduction 119 Degrees   Left Shoulder Flexion 162 Degrees   Left Shoulder ABduction 164 Degrees   Strength   Overall Strength Deficits   Overall Strength Comments mobility appears to require effort and extra time. shoulder strength is aobut 2+/5 on right and 3-/5 on right as she cannot raise arms fully against gravity    Right Hand Grip (lbs) 19  19/20/20   Left Hand Grip (lbs) 40  35/45/42   Palpation   Palpation comment axillary cording perceived at right axilla with guitar strings at  antecubital fossa            LYMPHEDEMA/ONCOLOGY QUESTIONNAIRE - 03/13/15 0842    Right Upper Extremity Lymphedema   15 cm Proximal to Olecranon Process 33.8 cm   Olecranon Process 26.9 cm   15 cm Proximal to Ulnar Styloid Process 26.6 cm   Just Proximal to Ulnar Styloid Process 16.5 cm   Across Hand at [employer] 20.3 cm   At [place] of 2nd Digit 6 cm   Left Upper Extremity Lymphedema   15 cm Proximal to Olecranon Process 33.5 cm   Olecranon Process 26 cm  15 cm Proximal to Ulnar Styloid Process 26.5 cm   Just Proximal to Ulnar Styloid Process 15.5 cm   Across Hand at [employer] 20 cm   At [place] of 2nd Digit 5.9 cm           Quick Dash - 03/13/15 0001    Open a tight or new jar Severe difficulty   Do heavy household chores (wash walls, wash floors) Moderate difficulty   Carry a shopping bag or briefcase Moderate difficulty   Wash your back Severe difficulty   Use a knife to cut food No difficulty   Recreational activities in which you take some force or impact through your arm, shoulder, or hand (golf, hammering, tennis) Moderate difficulty   During the past week, to what extent has your arm, shoulder or hand problem interfered with your normal social activities with family, friends, neighbors, or groups? Modererately   During the past week, to what extent has your arm, shoulder or hand problem limited your work or other regular daily activities Modererately   Arm, shoulder, or hand pain. Severe   Tingling (pins and needles) in your arm, shoulder, or hand Severe   Difficulty Sleeping Moderate difficulty   DASH Score 54.55 %             [facility] Adult PT Treatment/Exercise - 03/13/15 0001    Self-Care   Self-Care Other Self-Care Comments   Other Self-Care Comments  provided size medium tg soft for support to right arm lymphatics and help symptomatically with axillary cording                 PT Education - 03/13/15 1738    Education provided Yes    Education Details supine cane exercise   Person(s) Educated Patient   Methods Explanation;Demonstration;Handout   Comprehension Verbalized understanding;Returned demonstration                [facility] Goals - 03/13/15 1748    CC Long Term Goal  #1   Title Patient with verbalize an understanding of lymphedema risk reduction precautions   Baseline no knowledge   Time 3   Period --  authorizaion period   Status New   CC Long Term Goal  #2   Title Patient will be independent in a  home exercise program   Baseline no knowledge   Time 3   Period --  authorization period   Status New   CC Long Term Goal  #3   Title Patient will be know how to obtain and use compression garments for maintenance phase of treatment   Baseline no knowledge   Time 3   Period --  authorization period   CC Long Term Goal  #4   Title Patient will improve right  shoulder abduction to 130 degrees so that she can achieve position needed for radiation therapy.   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    Pt will benefit from skilled therapeutic intervention in order to improve on the following deficits Decreased range of motion;Decreased endurance;Impaired UE functional use;Pain;Decreased activity tolerance;Decreased knowledge of precautions;Decreased skin integrity;Decreased knowledge of use of DME;Decreased mobility;Decreased strength;Increased edema;Postural dysfunction   Rehab Potential Good    Clinical Impairments Affecting Rehab Potential long course of chemotherapy with peripheral neuropathy symptoms, 2 surgeries with ALND    PT Next Visit Plan Manual lymph drainage and  ROM to shoulder with treatment for cording   PT Home Exercise Plan eventual Strength ABC program   Recommended Other Services gave pt information about [employer] program and [facility] support groups and activities    Consulted and Agree with Plan of Care Patient         Problem List Patient Active Problem List   Diagnosis Date Noted  . Breast cancer 02/08/2015  . Pruritus 12/15/2014  . Genetic testing 09/18/2014  . Iron deficiency anemia due to chronic blood loss 07/26/2014  . Breast cancer of upper-outer quadrant of right female breast 07/24/2014   Donato Heinz. Owens Shark, PT   03/13/2015, 5:58 PM  [place], [place], [address] Phone: [phone]   Fax:  [phone]

## 2015-03-19 NOTE — Progress Notes (Signed)
Location of Breast Cancer:Right upper-outer quadrant.   Histology per Pathology Report:02/08/2015  INAL DIAGNOSIS Diagnosis 1. Breast, excision, Right posterior margin - BENIGN BREAST TISSUE, SEE COMMENT. - NEGATIVE FOR ATYPIA OR MALIGNANCY. - SURGICAL MARGIN, NEGATIVE FOR ATYPIA OR MALIGNANCY. 2. 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 - INTRA-NODAL TUMOR DEPOSIT IS 1.5 MM. - NEGATIVE FOR EXTRACAPSULAR TUMOR EXTENSION. Receptor Status: ER(+), PR (+), Her2-neu (-)  Did patient present with symptoms (if so, please note symptoms) or was this found on screening mammography?: mass palpated by patient.  Past/Anticipated interventions by surgeon, if any:02/08/15: BREAST LUMPECTOMY WITH AXILLARY LYMPH NODE DISSECTION 01/16/15:RADIOACTIVE SEED GUIDED PARTIAL MASTECTOMY WITH AXILLARY SENTINEL LYMPH NODE BIOPSY  Past/Anticipated interventions by medical oncology, if any: Chemotherapy:Dose dense adriamycin and cytoxan  X 4 followed by taxol x 12.from 08/09/14-12/19/14.  Lymphedema issues, if any: No  Pain issues, if any: chemo-induced neuropathy.  SAFETY ISSUES:  Prior radiation? No  Pacemaker/ICD? No  Possible current pregnancy?No.LMP July 2016.  Is the patient on methotrexate? No  Current Complaints / other details:Married.Menarche age 34, 2 children (autistic) first born at age 87.No HRT.G2P2. Maternal grandmother diagnosed breast cancer in her 82's. Allergies:tape Smoking:Quit December 1995.      Arlyss Repress, RN 03/19/2015,12:09 PM  BP 170/103 mmHg  Pulse 53  Temp(Src) 98.4 F (36.9 C)  Wt 189 lb 9.6 oz (86.002 kg)

## 2015-03-22 ENCOUNTER — Ambulatory Visit
Admission: RE | Admit: 2015-03-22 | Discharge: 2015-03-22 | Disposition: A | Discharge status: Home / Self Care | Payer: Medicaid Other | Source: Ambulatory Visit | Attending: Radiation Oncology | Admitting: Radiation Oncology

## 2015-03-22 ENCOUNTER — Encounter: Payer: Self-pay | Admitting: Radiation Oncology

## 2015-03-22 VITALS — BP 170/103 | HR 53 | Temp 98.4°F | Wt 189.6 lb

## 2015-03-22 DIAGNOSIS — C50411 Malignant neoplasm of upper-outer quadrant of right female breast: Secondary | ICD-10-CM

## 2015-03-22 DIAGNOSIS — Z510 Encounter for antineoplastic radiation therapy: Secondary | ICD-10-CM | POA: Insufficient documentation

## 2015-03-22 NOTE — Progress Notes (Signed)
Department of Radiation Oncology  Phone:  [phone] Fax:        [phone]   Name: Barbara Myers MRN: [id number]  DOB: Jan 02, 1976  Date: 03/22/2015  Follow Up Visit Note  Diagnosis: Breast cancer of upper-outer quadrant of right female breast   Staging form: Breast, AJCC 7th Edition     Clinical stage from 07/26/2014: Stage IIB (T2, N1, M0) - Unsigned       Staging comments: Staged at breast conference on 12.16.15   Interval History: Barbara Myers presents today for routine followup. 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 Lymphovascular invasion was seen and she had a focally positive margin, posteriorly. She had one out of three lymph nodes positive, with extracapsular extension. She underwent a axillary node dissection and excision of this margin on 02/08/2015. The margin was negative and one out of eleven nodes showed a micrometastasis. She is currently undergoing physical therapy to increase range of motion on her right arm. She will start anti-estrogen therapy after radiation treatments are complete. The patient is currently still menstruating. In regards to improvement on her arm with the aid of physical therapy, she expressed that she still experiences some numbness and some swelling, but range of motion has improved. She was able to hold her arm above her head without physical stress. The patient projected a healthy mental disposition and was not accompanied by family for today's radiation oncology appointment. She vocalized that she will soon participate in the Live Strong program at the [facility]! "I have two kids at home, I need to stay strong for them."  Physical Exam: The patient is alert and oriented x3. Filed Vitals:   03/22/15 0953  BP: 170/103  Pulse: 53  Temp: 98.4 F (36.9 C)  Weight: 189 lb 9.6 oz (86.002 kg)  Right female breast: It is observed that the inscision is well healed,  drain sites are also well healed, and surgical glue still in place.  IMPRESSION: [name] is a 39 y.o. female presenting to clinic in regards to her cancer of the right female breast. Treatment options in management of her breast cancer were discussed, including radiation treatments. She has healed well since her most recent surgery and her port has been removed.  PLAN: I spoke to the patient today regarding her diagnosis and options for treatment. We discussed the role of radiation in decreasing local failures after lumpectomy. We discussed treatment of the breast and supraclavicular lymph nodes. We discussed the process of simulation and the placement tattoos. We discussed 5 weeks of treatment as an outpatient. We discussed the possibility of asymptomatic lung damage. We discussed the low likelihood of secondary malignancies. We discussed the possible side effects including but not limited to skin redness, fatigue, permanent skin darkening, and breast breast swelling or lymphedema. She has been made aware of the CT scan and planning session to take place 9:00 am next Thursday (03/29/2015) with radiation treatments to follow. She was made aware of her follow up appointment with radiation oncology to take place as scheduled and encouraged to continue her physical therapy to improve the range of motion to her right arm. An appointment with Dr. Donne Hazel is to be scheduled. All questions and concerns vocalized were fully addressed. All consent paperwork was fully reviewed and signed. If she develops any further questions or concerns in regards to her treatment, she has been encouraged to contact Dr. Pablo Ledger.  This document serves  as a record of services personally performed by Thea Silversmith , MD. It was created on her behalf by Lenn Cal, a trained medical scribe. The creation of this record is based on the scribe's personal observations and the provider's statements to them. This document has been  checked and approved by the attending provider.   ------------------------------------------------  Thea Silversmith, MD

## 2015-03-28 ENCOUNTER — Ambulatory Visit
Admission: RE | Admit: 2015-03-28 | Discharge: 2015-03-28 | Disposition: A | Discharge status: Home / Self Care | Payer: Medicaid Other | Source: Ambulatory Visit | Attending: Radiation Oncology | Admitting: Radiation Oncology

## 2015-03-28 DIAGNOSIS — Z510 Encounter for antineoplastic radiation therapy: Secondary | ICD-10-CM | POA: Diagnosis not present

## 2015-03-28 DIAGNOSIS — C50411 Malignant neoplasm of upper-outer quadrant of right female breast: Secondary | ICD-10-CM

## 2015-03-28 NOTE — Progress Notes (Signed)
Name: Marybeth Dandy   MRN: [id number]  Date:  03/28/2015  DOB: February 07, 1976  Status:outpatient    DIAGNOSIS: Breast cancer of upper-outer quadrant of right female breast   Staging form: Breast, AJCC 7th Edition     Clinical stage from 07/26/2014: Stage IIB (T2, N1, M0) - Unsigned       Staging comments: Staged at breast conference on 12.16.15  CONSENT VERIFIED: yes   SET UP: Patient is setup supine   IMMOBILIZATION:  The following immobilization was used:Custom Moldable Pillow, breast board.   NARRATIVE: Ms. Driggs was brought to the [facility].  Identity was confirmed.  All relevant records and images related to the planned course of therapy were reviewed.  Then, the patient was positioned in a stable reproducible clinical set-up for radiation therapy.  Wires were placed to delineate the clinical extent of breast tissue. A wire was placed on the scar as well.  CT images were obtained.  An isocenter was placed. Skin markings were placed.  The CT images were loaded into the planning software where the target and avoidance structures were contoured.  The radiation prescription was entered and confirmed. The patient was discharged in stable condition and tolerated simulation well.   TREATMENT PLANNING NOTE/3D Simulation Note Treatment planning then occurred. I have requested : MLC's, isodose plan, basic dose calculation  3D simulation was performed.  I personally constructed 5 complex treatment devices in the form of MLCs which will be used for beam modification and to protect critical structures including the heart and lung.  I have requested a dose volume histogram of the heart lung and tumor cavity.    ------------------------------------------------  Thea Silversmith, MD  This document serves as a record of services personally performed by Thea Silversmith, MD. It was created on her behalf by Derek Mound, a trained medical scribe. The creation of this record is based on  the scribe's personal observations and the provider's statements to them. This document has been checked and approved by the attending provider.

## 2015-04-03 ENCOUNTER — Other Ambulatory Visit: Payer: Self-pay

## 2015-04-03 DIAGNOSIS — Z510 Encounter for antineoplastic radiation therapy: Secondary | ICD-10-CM | POA: Diagnosis not present

## 2015-04-04 ENCOUNTER — Ambulatory Visit
Admission: RE | Admit: 2015-04-04 | Discharge: 2015-04-04 | Disposition: A | Discharge status: Home / Self Care | Payer: Medicaid Other | Source: Ambulatory Visit | Attending: Radiation Oncology | Admitting: Radiation Oncology

## 2015-04-04 ENCOUNTER — Telehealth: Payer: Self-pay | Admitting: Hematology and Oncology

## 2015-04-04 ENCOUNTER — Ambulatory Visit: Payer: Medicaid Other | Admitting: Physical Therapy

## 2015-04-04 DIAGNOSIS — Z510 Encounter for antineoplastic radiation therapy: Secondary | ICD-10-CM | POA: Diagnosis not present

## 2015-04-04 NOTE — Telephone Encounter (Signed)
lvm for pt regarding to Sept appt mailed pt appt sched andl etter °

## 2015-04-05 ENCOUNTER — Ambulatory Visit
Admission: RE | Admit: 2015-04-05 | Discharge: 2015-04-05 | Disposition: A | Discharge status: Home / Self Care | Payer: Medicaid Other | Source: Ambulatory Visit | Attending: Radiation Oncology | Admitting: Radiation Oncology

## 2015-04-05 ENCOUNTER — Ambulatory Visit (HOSPITAL_BASED_OUTPATIENT_CLINIC_OR_DEPARTMENT_OTHER): Payer: Medicaid Other

## 2015-04-05 ENCOUNTER — Ambulatory Visit: Payer: Medicaid Other | Admitting: Physical Therapy

## 2015-04-05 VITALS — BP 171/95 | HR 55 | Temp 98.4°F

## 2015-04-05 DIAGNOSIS — M25611 Stiffness of right shoulder, not elsewhere classified: Secondary | ICD-10-CM | POA: Diagnosis not present

## 2015-04-05 DIAGNOSIS — Z5111 Encounter for antineoplastic chemotherapy: Secondary | ICD-10-CM | POA: Diagnosis present

## 2015-04-05 DIAGNOSIS — G620 Drug-induced polyneuropathy: Secondary | ICD-10-CM

## 2015-04-05 DIAGNOSIS — C50411 Malignant neoplasm of upper-outer quadrant of right female breast: Secondary | ICD-10-CM | POA: Diagnosis not present

## 2015-04-05 DIAGNOSIS — C773 Secondary and unspecified malignant neoplasm of axilla and upper limb lymph nodes: Secondary | ICD-10-CM

## 2015-04-05 DIAGNOSIS — T451X5A Adverse effect of antineoplastic and immunosuppressive drugs, initial encounter: Secondary | ICD-10-CM

## 2015-04-05 DIAGNOSIS — Z9189 Other specified personal risk factors, not elsewhere classified: Secondary | ICD-10-CM

## 2015-04-05 DIAGNOSIS — M25511 Pain in right shoulder: Secondary | ICD-10-CM

## 2015-04-05 DIAGNOSIS — Z510 Encounter for antineoplastic radiation therapy: Secondary | ICD-10-CM | POA: Diagnosis not present

## 2015-04-05 MED ORDER — GOSERELIN ACETATE 3.6 MG ~~LOC~~ IMPL
3.6000 mg | DRUG_IMPLANT | Freq: Once | SUBCUTANEOUS | Status: AC
  Administered 2015-04-05: 3.6 mg via SUBCUTANEOUS
  Filled 2015-04-05: qty 3.6

## 2015-04-05 NOTE — Patient Instructions (Addendum)
[email or web address] Lymphatic flow series with Shoosh Lettick Crotzer   Over Head Pull: Narrow Grip     [place] [id number]   On back, knees bent, feet flat, band across thighs, elbows straight but relaxed. Pull hands apart (start). Keeping elbows straight, bring arms up and over head, hands toward floor. Keep pull steady on band. Hold momentarily. Return slowly, keeping pull steady, back to start. Repeat _10__ times. Band color ___red___   Side Pull: Double Arm   On back, knees bent, feet flat. Arms perpendicular to body, shoulder level, elbows straight but relaxed. Pull arms out to sides, elbows straight. Resistance band comes across collarbones, hands toward floor. Hold momentarily. Slowly return to starting position. Repeat _10__ times. Band color __red___   Elmer Picker   On back, knees bent, feet flat, left hand on left hip, right hand above left. Pull right arm DIAGONALLY (hip to shoulder) across chest. Bring right arm along head toward floor. Hold momentarily. Slowly return to starting position. Repeat _10__ times. Do with left arm. Band color _red_____   Shoulder Rotation: Double Arm   On back, knees bent, feet flat, elbows tucked at sides, bent 90, hands palms up. Pull hands apart and down toward floor, keeping elbows near sides. Hold momentarily. Slowly return to starting position. Repeat _10__ times. Band color ___red___

## 2015-04-05 NOTE — Therapy (Addendum)
[place], [place], [address] Phone: [phone]   Fax:  [phone]  Physical Therapy Treatment  Patient Details  Name: Barbara Myers MRN: [id number] Date of Birth: August 22, 1975 Referring Provider:  Rolm Bookbinder, MD  Encounter Date: 04/05/2015      PT End of Session - 04/05/15 1803    Visit Number 2   Number of Visits 4   Date for PT Re-Evaluation 05/11/15   PT Start Time 1520   PT Stop Time 1612   PT Time Calculation (min) 52 min   Activity Tolerance Patient tolerated treatment well   Behavior During Therapy [facility] for tasks assessed/performed      Past Medical History  Diagnosis Date  . Anemia   . Breast cancer of upper-outer quadrant of right female breast 07/24/2014  . Anxiety   . Wears glasses   . Hypertension     bp elevated during chemo, not before    Past Surgical History  Procedure Laterality Date  . Wisdom tooth extraction    . Umbilical hernia repair      1990  . Portacath placement N/A 08/08/2014    Procedure: INSERTION PORT-A-CATH left subclavian;  Surgeon: Rolm Bookbinder, MD;  Location: [place];  Service: General;  Laterality: N/A;  . Radioactive seed guided mastectomy with axillary sentinel lymph node biopsy Right 01/16/2015    Procedure: RADIOACTIVE SEED GUIDED PARTIAL MASTECTOMY WITH RIGHT RADIOACTIVE SEED GUIDED AXILLARY SENTINEL LYMPH NODE BIOPSY;  Surgeon: Rolm Bookbinder, MD;  Location: [place];  Service: General;  Laterality: Right;  . Breast lumpectomy with axillary lymph node dissection Right 02/08/2015    Procedure: RIGHT AXILLARY LYMPH NODE DISECTION, RIGHT RE-EXCISION OF MARGIN;  Surgeon: Rolm Bookbinder, MD;  Location: [place];  Service: General;  Laterality: Right;  . Port-a-cath removal Left 02/08/2015    Procedure: REMOVAL PORT-A-CATH;  Surgeon: Rolm Bookbinder, MD;  Location: [place];  Service: General;  Laterality: Left;    There were no vitals filed for this visit.  Visit Diagnosis:  Stiffness of shoulder joint, right  Neuropathy due to chemotherapeutic drug  At risk for lymphedema  Pain in joint, shoulder region, right      Subjective Assessment - 04/05/15 1525    Subjective had first radiation today, had no problem getting into position.  Still has trouble with cording in her axilla.  Still has numbness in right upper arm. Still has nueropathy symptoms.    Currently in Pain? Yes   Pain Score --  numbness in toes    Pain Location Toe (Comment which one)   Pain Radiating Towards pain in feet and legs at the end of the day.             [facility] PT Assessment - 04/05/15 0001    AROM   Right Shoulder Flexion 162 Degrees   Right Shoulder ABduction 144 Degrees                     [facility] Adult PT Treatment/Exercise - 04/05/15 0001    Self-Care   Other Self-Care Comments  small tg soft to arm, chip pack to axilla and small foam patch to breast incision area   Shoulder Exercises: Supine   Other Supine Exercises supine scapular series with red theraband   Manual Therapy   Manual therapy comments issued [place] training DVD   Manual Lymphatic Drainage (MLD) short neck, superficial and deep abdominals, right  inguinal nodes, right axillo-inguinal anastamoses, left axilla, anterior interaxillary anastamoses, right shoulder, axilla upper. lower arm and hand with return along pathways                 PT Education - 04/05/15 1802    Education provided Yes   Education Details lymphatic flow series, supine scapular series exercise with red and green theraband for progression, use of compression and foam   Person(s) Educated Patient   Methods Explanation;Demonstration;Handout   Comprehension Verbalized understanding;Returned demonstration;Verbal cues required                [facility] Goals - 04/05/15 1809    CC Long Term  Goal  #1   Title Patient with verbalize an understanding of lymphedema risk reduction precautions   Status On-going   CC Long Term Goal  #2   Title Patient will be independent in a  home exercise program   Status On-going   CC Long Term Goal  #3   Title Patient will be know how to obtain and use compression garments for maintenance phase of treatment   Status On-going   CC Long Term Goal  #4   Title Patient will improve right  shoulder abduction to 130 degrees so that she can achieve position needed for radiation therapy.   Status Achieved   CC Long Term Goal  #5   Title Patient will decrease the DASH score to <20    to demonstrate increased functional use of upper extremity   Status On-going            Plan - 04/05/15 1804    Clinical Impression Statement pt had made good initial improvement in range of motion on her own, but has some lymphedema in arm, axilla and lateral breast near incision. She continues with nueropathy symptoms   She is now starting radiation therapy. She received information about manual lymph drainage and exercise progression and will do this throughout radiation.  Will save visits until after radiation if needed  She will benefit from a compression sleeve and guantlet/ and possibly belisse bra if she continues to have firmness in breast around incision.    PT Next Visit Plan assess edema after radiation. Begin strenth ABC progression training. make sure compression garments are in progress and assess need for Belisse bra        Problem List Patient Active Problem List   Diagnosis Date Noted  . Breast cancer 02/08/2015  . Pruritus 12/15/2014  . Genetic testing 09/18/2014  . Iron deficiency anemia due to chronic blood loss 07/26/2014  . Breast cancer of upper-outer quadrant of right female breast 07/24/2014   Donato Heinz. Owens Shark, PT  04/05/2015, 6:10 PM     PHYSICAL THERAPY DISCHARGE SUMMARY  Visits from Start of Care: 2  Current functional level  related to goals / functional outcomes: As above   Remaining deficits: unkown   Education / Equipment: Home exercise  Plan: Patient agrees to discharge.  Patient goals were partially met. Patient is being discharged due to not returning since the last visit.  ?????        Maudry Diego, PT 09/06/2015 1:07 PM   [place], [place], [address] Phone: [phone]   Fax:  [phone]

## 2015-04-06 ENCOUNTER — Ambulatory Visit
Admission: RE | Admit: 2015-04-06 | Discharge: 2015-04-06 | Disposition: A | Discharge status: Home / Self Care | Payer: Medicaid Other | Source: Ambulatory Visit | Attending: Radiation Oncology | Admitting: Radiation Oncology

## 2015-04-06 ENCOUNTER — Encounter: Payer: Self-pay | Admitting: *Deleted

## 2015-04-06 DIAGNOSIS — C50411 Malignant neoplasm of upper-outer quadrant of right female breast: Secondary | ICD-10-CM

## 2015-04-06 DIAGNOSIS — Z510 Encounter for antineoplastic radiation therapy: Secondary | ICD-10-CM | POA: Diagnosis not present

## 2015-04-06 NOTE — Progress Notes (Signed)
04/06/2015  0915  PREVENT Study Received PREVENT Clinical Report results from [facility] re: Cardiac MRI performed on 04/04/15.  The results were shown to Dr. Lindi Adie.  He is aware of the potentially significant findings in the report and stated there would be nothing to do with the information.

## 2015-04-09 ENCOUNTER — Ambulatory Visit
Admission: RE | Admit: 2015-04-09 | Discharge: 2015-04-09 | Disposition: A | Discharge status: Home / Self Care | Payer: Medicaid Other | Source: Ambulatory Visit | Attending: Radiation Oncology | Admitting: Radiation Oncology

## 2015-04-09 DIAGNOSIS — C50411 Malignant neoplasm of upper-outer quadrant of right female breast: Secondary | ICD-10-CM | POA: Insufficient documentation

## 2015-04-09 DIAGNOSIS — Z510 Encounter for antineoplastic radiation therapy: Secondary | ICD-10-CM | POA: Diagnosis not present

## 2015-04-09 MED ORDER — ALRA NON-METALLIC DEODORANT (RAD-ONC)
1.0000 "application " | Freq: Once | TOPICAL | Status: AC
  Administered 2015-04-09: 1 via TOPICAL

## 2015-04-09 MED ORDER — RADIAPLEXRX EX GEL
Freq: Once | CUTANEOUS | Status: AC
  Administered 2015-04-09: 13:00:00 via TOPICAL

## 2015-04-09 NOTE — Progress Notes (Signed)
Pt here for patient teaching.  Pt given Radiation and You booklet, skin care instructions, Alra deodorant and Radiaplex gel. Pt reports they have not watched the Radiation Therapy Education video on April 09, 2015.  Reviewed areas of pertinence such as fatigue and skin changes . Pt able to give teach back of to pat skin and use unscented/gentle soap,apply Radiaplex bid, avoid applying anything to skin within 4 hours of treatment and to use an electric razor if they must shave. Pt demonstrated understanding and verbalizes understanding of information given and will contact nursing with any questions or concerns.    Pt states they have not watched the Radiation Therapy Education.  Patient states they will watch the video at home.

## 2015-04-10 ENCOUNTER — Ambulatory Visit
Admission: RE | Admit: 2015-04-10 | Discharge: 2015-04-10 | Disposition: A | Discharge status: Home / Self Care | Payer: Medicaid Other | Source: Ambulatory Visit | Attending: Radiation Oncology | Admitting: Radiation Oncology

## 2015-04-10 ENCOUNTER — Encounter: Payer: Medicaid Other | Admitting: Radiation Oncology

## 2015-04-10 VITALS — BP 162/112 | HR 51 | Temp 98.8°F | Wt 187.0 lb

## 2015-04-10 DIAGNOSIS — C50411 Malignant neoplasm of upper-outer quadrant of right female breast: Secondary | ICD-10-CM

## 2015-04-10 DIAGNOSIS — Z510 Encounter for antineoplastic radiation therapy: Secondary | ICD-10-CM | POA: Diagnosis not present

## 2015-04-10 NOTE — Progress Notes (Signed)
Weekly Management Note Current Dose: 7.2  Gy  Projected Dose: 61 Gy   Narrative:  The patient presents for routine under treatment assessment.  CBCT/MVCT images/Port film x-rays were reviewed.  The chart was checked.  She is doing well. She has no complaints.   Physical Findings: Weight: 187 lb (84.823 kg). Unchanged.  Impression:  The patient is tolerating radiation.  Plan:  Continue treatment as planned. Continue radiaplex.

## 2015-04-11 ENCOUNTER — Ambulatory Visit
Admission: RE | Admit: 2015-04-11 | Discharge: 2015-04-11 | Disposition: A | Discharge status: Home / Self Care | Payer: Medicaid Other | Source: Ambulatory Visit | Attending: Radiation Oncology | Admitting: Radiation Oncology

## 2015-04-11 ENCOUNTER — Ambulatory Visit: Payer: Medicaid Other

## 2015-04-11 ENCOUNTER — Encounter: Payer: Self-pay | Admitting: *Deleted

## 2015-04-11 ENCOUNTER — Other Ambulatory Visit: Payer: Self-pay | Admitting: Hematology and Oncology

## 2015-04-11 DIAGNOSIS — C50411 Malignant neoplasm of upper-outer quadrant of right female breast: Secondary | ICD-10-CM

## 2015-04-11 DIAGNOSIS — Z510 Encounter for antineoplastic radiation therapy: Secondary | ICD-10-CM | POA: Diagnosis not present

## 2015-04-11 NOTE — Progress Notes (Signed)
04/11/2015  1015  PREVENT Spoke with Dr. Lindi Adie regarding phone call and e-mail from Dr. Danny Lawless, [place] for PREVENT study at [facility],  on 04/10/15.  Per Dr. Danny Lawless, "the physician who read the cardiac [facility] at [facility] is wondering whether the patient might have pulmonary hypertension". This RN spoke with Dr. Lindi Adie regarding above.  Dr. Lindi Adie  made a referral to a cardiologist and said he would notify the patient and explain why.

## 2015-04-12 ENCOUNTER — Encounter: Payer: Self-pay | Admitting: Hematology and Oncology

## 2015-04-12 ENCOUNTER — Ambulatory Visit
Admission: RE | Admit: 2015-04-12 | Discharge: 2015-04-12 | Disposition: A | Discharge status: Home / Self Care | Payer: Medicaid Other | Source: Ambulatory Visit | Attending: Radiation Oncology | Admitting: Radiation Oncology

## 2015-04-12 ENCOUNTER — Telehealth: Payer: Self-pay | Admitting: Hematology and Oncology

## 2015-04-12 DIAGNOSIS — Z510 Encounter for antineoplastic radiation therapy: Secondary | ICD-10-CM | POA: Diagnosis not present

## 2015-04-12 NOTE — Telephone Encounter (Signed)
Called and left a message with heart center appointment 9/20

## 2015-04-13 ENCOUNTER — Ambulatory Visit
Admission: RE | Admit: 2015-04-13 | Discharge: 2015-04-13 | Disposition: A | Discharge status: Home / Self Care | Payer: Medicaid Other | Source: Ambulatory Visit | Attending: Radiation Oncology | Admitting: Radiation Oncology

## 2015-04-13 DIAGNOSIS — Z510 Encounter for antineoplastic radiation therapy: Secondary | ICD-10-CM | POA: Diagnosis not present

## 2015-04-17 ENCOUNTER — Ambulatory Visit
Admission: RE | Admit: 2015-04-17 | Discharge: 2015-04-17 | Disposition: A | Discharge status: Home / Self Care | Payer: Medicaid Other | Source: Ambulatory Visit | Attending: Radiation Oncology | Admitting: Radiation Oncology

## 2015-04-17 ENCOUNTER — Encounter: Payer: Self-pay | Admitting: Radiation Oncology

## 2015-04-17 VITALS — BP 154/104 | HR 57 | Temp 98.6°F | Resp 20 | Wt 187.3 lb

## 2015-04-17 DIAGNOSIS — Z510 Encounter for antineoplastic radiation therapy: Secondary | ICD-10-CM | POA: Diagnosis not present

## 2015-04-17 DIAGNOSIS — C50411 Malignant neoplasm of upper-outer quadrant of right female breast: Secondary | ICD-10-CM

## 2015-04-17 NOTE — Progress Notes (Signed)
Weekly rad txs right breast, tanning under axilla, skin looks good, intact, using radiaplex bid-tid,  Slight isore ness ,swelling, stated , appetite good energy level okay 2:49 PM BP 154/104 mmHg  Pulse 57  Temp(Src) 98.6 F (37 C) (Oral)  Resp 20  Wt 187 lb 4.8 oz (84.959 kg)  Wt Readings from Last 3 Encounters:  04/17/15 187 lb 4.8 oz (84.959 kg)  04/10/15 187 lb (84.823 kg)  03/22/15 189 lb 9.6 oz (86.002 kg)

## 2015-04-17 NOTE — Progress Notes (Signed)
Weekly Management Note Current Dose: 14.4  Gy  Projected Dose: 61 Gy   Narrative:  The patient presents for routine under treatment assessment.  CBCT/MVCT images/Port film x-rays were reviewed.  The chart was checked.  She is doing well. She has some soreness and swelling in her left axilla. She is working full time.   Physical Findings: Weight: 187 lb 4.8 oz (84.959 kg). Slight skin darkness under right arm.   Impression:  The patient is tolerating radiation.  Plan:  Continue treatment as planned. Continue radiaplex.

## 2015-04-18 ENCOUNTER — Ambulatory Visit
Admission: RE | Admit: 2015-04-18 | Discharge: 2015-04-18 | Disposition: A | Discharge status: Home / Self Care | Payer: Medicaid Other | Source: Ambulatory Visit | Attending: Radiation Oncology | Admitting: Radiation Oncology

## 2015-04-18 DIAGNOSIS — Z510 Encounter for antineoplastic radiation therapy: Secondary | ICD-10-CM | POA: Diagnosis not present

## 2015-04-19 ENCOUNTER — Ambulatory Visit
Admission: RE | Admit: 2015-04-19 | Discharge: 2015-04-19 | Disposition: A | Discharge status: Home / Self Care | Payer: Medicaid Other | Source: Ambulatory Visit | Attending: Radiation Oncology | Admitting: Radiation Oncology

## 2015-04-19 DIAGNOSIS — Z510 Encounter for antineoplastic radiation therapy: Secondary | ICD-10-CM | POA: Diagnosis not present

## 2015-04-20 ENCOUNTER — Ambulatory Visit
Admission: RE | Admit: 2015-04-20 | Discharge: 2015-04-20 | Disposition: A | Discharge status: Home / Self Care | Payer: Medicaid Other | Source: Ambulatory Visit | Attending: Radiation Oncology | Admitting: Radiation Oncology

## 2015-04-20 DIAGNOSIS — Z510 Encounter for antineoplastic radiation therapy: Secondary | ICD-10-CM | POA: Diagnosis not present

## 2015-04-23 ENCOUNTER — Ambulatory Visit
Admission: RE | Admit: 2015-04-23 | Discharge: 2015-04-23 | Disposition: A | Discharge status: Home / Self Care | Payer: Medicaid Other | Source: Ambulatory Visit | Attending: Radiation Oncology | Admitting: Radiation Oncology

## 2015-04-23 DIAGNOSIS — Z510 Encounter for antineoplastic radiation therapy: Secondary | ICD-10-CM | POA: Diagnosis not present

## 2015-04-24 ENCOUNTER — Ambulatory Visit
Admission: RE | Admit: 2015-04-24 | Discharge: 2015-04-24 | Disposition: A | Discharge status: Home / Self Care | Payer: Medicaid Other | Source: Ambulatory Visit | Attending: Radiation Oncology | Admitting: Radiation Oncology

## 2015-04-24 DIAGNOSIS — Z510 Encounter for antineoplastic radiation therapy: Secondary | ICD-10-CM | POA: Diagnosis not present

## 2015-04-25 ENCOUNTER — Ambulatory Visit
Admission: RE | Admit: 2015-04-25 | Discharge: 2015-04-25 | Disposition: A | Discharge status: Home / Self Care | Payer: Medicaid Other | Source: Ambulatory Visit | Attending: Radiation Oncology | Admitting: Radiation Oncology

## 2015-04-25 DIAGNOSIS — Z510 Encounter for antineoplastic radiation therapy: Secondary | ICD-10-CM | POA: Diagnosis not present

## 2015-04-26 ENCOUNTER — Ambulatory Visit
Admission: RE | Admit: 2015-04-26 | Discharge: 2015-04-26 | Disposition: A | Discharge status: Home / Self Care | Payer: Medicaid Other | Source: Ambulatory Visit | Attending: Radiation Oncology | Admitting: Radiation Oncology

## 2015-04-26 VITALS — BP 190/112 | HR 54 | Temp 98.3°F | Wt 190.1 lb

## 2015-04-26 DIAGNOSIS — C50411 Malignant neoplasm of upper-outer quadrant of right female breast: Secondary | ICD-10-CM

## 2015-04-26 DIAGNOSIS — Z510 Encounter for antineoplastic radiation therapy: Secondary | ICD-10-CM | POA: Diagnosis not present

## 2015-04-26 NOTE — Addendum Note (Signed)
Encounter addended by: Thea Silversmith, MD on: 04/26/2015  3:22 PM<BR>     Documentation filed: Notes Section

## 2015-04-26 NOTE — Progress Notes (Signed)
Weekly Management Note Current Dose: 27  Gy  Projected Dose: 61 Gy   Narrative:  The patient presents for routine under treatment assessment.  CBCT/MVCT images/Port film x-rays were reviewed.  The chart was checked. BP still elevated. Appt with pulm. HTN clinic next Tuesday. C/O fatigue.   Physical Findings: Weight: 160 lb 1.6 oz (72.621 kg). Unchanged  Impression:  The patient is tolerating radiation.  Plan:  Continue treatment as planned. Rest. Attend pulm appt.

## 2015-04-27 ENCOUNTER — Ambulatory Visit
Admission: RE | Admit: 2015-04-27 | Discharge: 2015-04-27 | Disposition: A | Discharge status: Home / Self Care | Payer: Medicaid Other | Source: Ambulatory Visit | Attending: Radiation Oncology | Admitting: Radiation Oncology

## 2015-04-27 DIAGNOSIS — Z510 Encounter for antineoplastic radiation therapy: Secondary | ICD-10-CM | POA: Diagnosis not present

## 2015-04-30 ENCOUNTER — Ambulatory Visit
Admission: RE | Admit: 2015-04-30 | Discharge: 2015-04-30 | Disposition: A | Discharge status: Home / Self Care | Payer: Medicaid Other | Source: Ambulatory Visit | Attending: Radiation Oncology | Admitting: Radiation Oncology

## 2015-04-30 DIAGNOSIS — Z510 Encounter for antineoplastic radiation therapy: Secondary | ICD-10-CM | POA: Diagnosis not present

## 2015-05-01 ENCOUNTER — Ambulatory Visit (HOSPITAL_COMMUNITY)
Admission: RE | Admit: 2015-05-01 | Discharge: 2015-05-01 | Disposition: A | Discharge status: Home / Self Care | Payer: Medicaid Other | Source: Ambulatory Visit | Attending: Cardiology | Admitting: Cardiology

## 2015-05-01 ENCOUNTER — Encounter (HOSPITAL_COMMUNITY): Payer: Self-pay

## 2015-05-01 ENCOUNTER — Ambulatory Visit
Admission: RE | Admit: 2015-05-01 | Discharge: 2015-05-01 | Disposition: A | Discharge status: Home / Self Care | Payer: Medicaid Other | Source: Ambulatory Visit | Attending: Radiation Oncology | Admitting: Radiation Oncology

## 2015-05-01 VITALS — BP 163/110 | HR 58 | Temp 98.0°F | Wt 190.3 lb

## 2015-05-01 VITALS — BP 150/104 | HR 60 | Ht 65.0 in | Wt 190.0 lb

## 2015-05-01 DIAGNOSIS — I10 Essential (primary) hypertension: Secondary | ICD-10-CM | POA: Insufficient documentation

## 2015-05-01 DIAGNOSIS — I270 Primary pulmonary hypertension: Secondary | ICD-10-CM

## 2015-05-01 DIAGNOSIS — Z170 Estrogen receptor positive status [ER+]: Secondary | ICD-10-CM | POA: Diagnosis not present

## 2015-05-01 DIAGNOSIS — I7789 Other specified disorders of arteries and arterioles: Secondary | ICD-10-CM | POA: Insufficient documentation

## 2015-05-01 DIAGNOSIS — Q2579 Other congenital malformations of pulmonary artery: Secondary | ICD-10-CM

## 2015-05-01 DIAGNOSIS — C50411 Malignant neoplasm of upper-outer quadrant of right female breast: Secondary | ICD-10-CM

## 2015-05-01 DIAGNOSIS — Z823 Family history of stroke: Secondary | ICD-10-CM | POA: Diagnosis not present

## 2015-05-01 DIAGNOSIS — Z8249 Family history of ischemic heart disease and other diseases of the circulatory system: Secondary | ICD-10-CM | POA: Diagnosis not present

## 2015-05-01 DIAGNOSIS — I272 Pulmonary hypertension, unspecified: Secondary | ICD-10-CM

## 2015-05-01 DIAGNOSIS — C50919 Malignant neoplasm of unspecified site of unspecified female breast: Secondary | ICD-10-CM | POA: Diagnosis not present

## 2015-05-01 DIAGNOSIS — Z79899 Other long term (current) drug therapy: Secondary | ICD-10-CM | POA: Diagnosis not present

## 2015-05-01 DIAGNOSIS — Z510 Encounter for antineoplastic radiation therapy: Secondary | ICD-10-CM | POA: Diagnosis not present

## 2015-05-01 DIAGNOSIS — Z9221 Personal history of antineoplastic chemotherapy: Secondary | ICD-10-CM | POA: Insufficient documentation

## 2015-05-01 LAB — TSH: TSH: 1.458 u[IU]/mL (ref 0.350–4.500)

## 2015-05-01 LAB — BRAIN NATRIURETIC PEPTIDE: B Natriuretic Peptide: 65.3 pg/mL (ref 0.0–100.0)

## 2015-05-01 MED ORDER — AMLODIPINE BESYLATE 5 MG PO TABS: 5.0000 mg | ORAL_TABLET | Freq: Every day | ORAL | Status: DC

## 2015-05-01 NOTE — Patient Instructions (Addendum)
Start Amlodipine to 5 mg daily  Labs today  Your physician has requested that you have an echocardiogram. Echocardiography is a painless test that uses sound waves to create images of your heart. It provides your doctor with information about the size and shape of your heart and how well your heart's chambers and valves are working. This procedure takes approximately one hour. There are no restrictions for this procedure.  Your physician recommends that you schedule a follow-up appointment in: 3 week with Dr Aundra Dubin

## 2015-05-01 NOTE — Progress Notes (Signed)
Weekly Management Note Current Dose: 32.4 Gy  Projected Dose: 61 Gy   Narrative:  The patient presents for routine under treatment assessment.  CBCT/MVCT images/Port film x-rays were reviewed.  The chart was checked. On new BP meds per Cardio-oncology clinic. Some axillary soreness and swelling.   Physical Findings: Weight: 160 lb 1.6 oz (72.621 kg). Early moist desquamation (almost) in inframammary fold.   Impression:  The patient is tolerating radiation.  Plan:  Continue treatment as planned. Rest. Watch inframammary fold. Continue radiaplex.

## 2015-05-01 NOTE — Progress Notes (Signed)
BP 163/110 mmHg  Pulse 58  Temp(Src) 98 F (36.7 C)  Wt 190 lb 4.8 oz (86.32 kg)  Weekly assessment of radiation to right breast.Completed 18 of 33 treatments.seen by Dr.Bensimohn for high blood pressure.given script to start amlodipine.Hyperpigmentation greater of axilla and mammary fold.Mild fatigue.

## 2015-05-02 ENCOUNTER — Ambulatory Visit
Admission: RE | Admit: 2015-05-02 | Discharge: 2015-05-02 | Disposition: A | Discharge status: Home / Self Care | Payer: Medicaid Other | Source: Ambulatory Visit | Attending: Radiation Oncology | Admitting: Radiation Oncology

## 2015-05-02 DIAGNOSIS — Z510 Encounter for antineoplastic radiation therapy: Secondary | ICD-10-CM | POA: Diagnosis not present

## 2015-05-02 DIAGNOSIS — Q2579 Other congenital malformations of pulmonary artery: Secondary | ICD-10-CM | POA: Insufficient documentation

## 2015-05-02 NOTE — Progress Notes (Signed)
Patient ID: Barbara Myers, female   DOB: [birth year], 39 y.o.   MRN: [id number] PCP: Dr. Lindi Adie  39 yo with history of HTN and breast cancer presents for cardiology evaluation after cardiac MRI done as part of a trial showed enlargement of the main PA suggestive of pulmonary hypertension. She has breast cancer and received chemotherapy involving Adriamycin initially, followed by lumpectomy and nodal biopsy.  She has been enrolled in the PREVENT trial (atorvastatin versus placebo).  As part of this trial, she had a cardiac MRI.  This showed mild PA dilation to 3 cm concerning for possible pulmonary hypertension.  Last echo in 12/15 showed no evidence for pulmonary hypertension, EF was normal.   Patient has been doing well generally.  No exertional dyspnea.  No chest pain.  No orthopnea/PND.  No lightheadedness.  She does not screen positive for OSA.  No history of smoking or lung disease.  Of note, her BP has been running quite high for a number of months.   Labs (7/16): K 4.2, creatinine 0.87  PMH: 1. HTN 2. Breast cancer: Diagnosed 12/15.  ER+/PR+/HER2-.  12/15-5/16 had neoadjuvant chemo with Cytoxan, Adriamycin, and Taxol.  Lumpectomy/sentinel node biopsy 6/16.  Margins were focally positive and 1/3 LNs positive.  She then had axillary node dissection and excision of margin.  She is now undergoing radiation.  - Echo (12/15) with EF 55-60%, GLS -24%.   SH: Lives in [place], nonsmoker.  Works in [employer] research (medical).  FH: Grandmother with breast cancer.  No heart disease.  Mother and sister with hypertension, mother had CVA.   ROS: All systems reviewed and negative except as per HPI.    Current Outpatient Prescriptions  Medication Sig Dispense Refill  . Atorvastatin Calcium (INVESTIGATIONAL ATORVASTATIN/PLACEBO) 40 MG tablet [facility] [address] Take 1 tablet by mouth daily. Take 1 tablet daily with or without food. 180 tablet 0  . gabapentin (NEURONTIN) 300 MG capsule Take 1 capsule (300 mg total) by mouth  3 (three) times daily. 90 capsule 3  . hyaluronate sodium (RADIAPLEXRX) GEL Apply 1 application topically 2 (two) times daily.    . metoprolol (LOPRESSOR) 50 MG tablet Take 1 tablet (50 mg total) by mouth 2 (two) times daily. 30 tablet 3  . non-metallic deodorant (ALRA) MISC Apply 1 application topically daily as needed.    Marland Kitchen amLODipine (NORVASC) 5 MG tablet Take 1 tablet (5 mg total) by mouth daily. (Patient not taking: Reported on 05/01/2015) 30 tablet 3  . LORazepam (ATIVAN) 0.5 MG tablet TAKE 1 TABLET BY MOUTH EVERY 6 HOURS AS NEEDED FOR NAUSEA AND VOMITING (Patient not taking: Reported on 05/01/2015) 30 tablet 2  . oxyCODONE-acetaminophen (PERCOCET) 10-325 MG per tablet Take 1 tablet by mouth every 6 (six) hours as needed for pain. (Patient not taking: Reported on 03/13/2015) 20 tablet 0   No current facility-administered medications for this encounter.   BP 150/104 mmHg  Pulse 60  Ht 5' 5"  (1.651 m)  Wt 190 lb (86.183 kg)  BMI 31.62 kg/m2  SpO2 100% General: NAD Neck: No JVD, no thyromegaly or thyroid nodule.  Lungs: Clear to auscultation bilaterally with normal respiratory effort. CV: Nondisplaced PMI.  Heart regular S1/S2, no S3/S4, no murmur.  No peripheral edema.  No carotid bruit.  Normal pedal pulses.  Abdomen: Soft, nontender, no hepatosplenomegaly, no distention.  Skin: Intact without lesions or rashes.  Neurologic: Alert and oriented x 3.  Psych: Normal affect. Extremities: No clubbing or cyanosis.  HEENT: Normal.   Assessment/Plan:  1. Dilated main pain on cardiac MRI: Mild dilation, 3.0 cm.  This is somewhat nonspecific but can suggest pulmonary hypertension.  She has no exertional symptoms, chest pain, or lightheadedness.  It is noteworthy that her BP is quite high.  I am concerned that she may have a degree of diastolic dysfunction from HTN with pulmonary venous hypertension.  Pulmonary arterial hypertension seems unlikely but not out of the question.  She does not screen  positive for OSA and she has no history of lung disease.  - I will have her repeat an echo, paying most attention to the RV and PA pressure estimate.   - Check BNP today.  2. HTN: BP is running quite high, add amlodipine 5 mg daily.   Loralie Champagne  05/02/2015

## 2015-05-03 ENCOUNTER — Ambulatory Visit
Admission: RE | Admit: 2015-05-03 | Discharge: 2015-05-03 | Disposition: A | Discharge status: Home / Self Care | Payer: Medicaid Other | Source: Ambulatory Visit | Attending: Radiation Oncology | Admitting: Radiation Oncology

## 2015-05-03 ENCOUNTER — Other Ambulatory Visit (HOSPITAL_COMMUNITY): Payer: Medicaid Other

## 2015-05-03 DIAGNOSIS — Z510 Encounter for antineoplastic radiation therapy: Secondary | ICD-10-CM | POA: Diagnosis not present

## 2015-05-04 ENCOUNTER — Encounter: Payer: Self-pay | Admitting: *Deleted

## 2015-05-04 ENCOUNTER — Ambulatory Visit
Admission: RE | Admit: 2015-05-04 | Discharge: 2015-05-04 | Disposition: A | Discharge status: Home / Self Care | Payer: Medicaid Other | Source: Ambulatory Visit | Attending: Radiation Oncology | Admitting: Radiation Oncology

## 2015-05-04 DIAGNOSIS — Z510 Encounter for antineoplastic radiation therapy: Secondary | ICD-10-CM | POA: Diagnosis not present

## 2015-05-04 NOTE — Progress Notes (Signed)
[place] Work  Clinical Social Work was referred by patient for assessment of psychosocial needs due to financial concerns.  Clinical Social Worker met with patient at [facility]  to offer support and assess for needs.  Pt reports she qualified for the grant and has used up all her funds and she and family are really struggling. CSW reviewed resources and pt is eager to apply for [place], [employer], [employer] and [employer]. Pt given several applications and plans to return next week to complete. Pt to phone CSW team to set up appt for next week.   Clinical Social Work interventions: Supportive listening Resource education Loren Racer, [place] Worker [place]  [place] Phone: [phone] Fax: [phone]

## 2015-05-07 ENCOUNTER — Ambulatory Visit
Admission: RE | Admit: 2015-05-07 | Discharge: 2015-05-07 | Disposition: A | Discharge status: Home / Self Care | Payer: Medicaid Other | Source: Ambulatory Visit | Attending: Radiation Oncology | Admitting: Radiation Oncology

## 2015-05-07 DIAGNOSIS — Z510 Encounter for antineoplastic radiation therapy: Secondary | ICD-10-CM | POA: Diagnosis not present

## 2015-05-08 ENCOUNTER — Ambulatory Visit
Admission: RE | Admit: 2015-05-08 | Discharge: 2015-05-08 | Disposition: A | Discharge status: Home / Self Care | Payer: Medicaid Other | Source: Ambulatory Visit | Attending: Radiation Oncology | Admitting: Radiation Oncology

## 2015-05-08 ENCOUNTER — Telehealth: Payer: Self-pay | Admitting: Nutrition

## 2015-05-08 VITALS — BP 142/89 | HR 69 | Temp 98.2°F | Wt 187.4 lb

## 2015-05-08 DIAGNOSIS — Z510 Encounter for antineoplastic radiation therapy: Secondary | ICD-10-CM | POA: Diagnosis not present

## 2015-05-08 DIAGNOSIS — C50411 Malignant neoplasm of upper-outer quadrant of right female breast: Secondary | ICD-10-CM

## 2015-05-08 NOTE — Telephone Encounter (Signed)
Patient left me a voicemail requesting nutrition appointment. Contacted patient by phone.  However, she was not available.   Left my name and phone number for patient to call me back for appointment if desired.

## 2015-05-08 NOTE — Progress Notes (Signed)
Weekly assessment of radiation to right [employer].ch 23 of 33 treatments.marked hyperpigmentation of axilla and mammary fold with small area of wet peel.Blood pressure much improved.scheduled for echo on 05/11/15. BP 142/89 mmHg  Pulse 69  Temp(Src) 98.2 F (36.8 C)  Wt 187 lb 6.4 oz (85.004 kg)

## 2015-05-08 NOTE — Progress Notes (Signed)
Weekly Management Note Current Dose: 41.4  Gy  Projected Dose: 61 Gy   Narrative:  The patient presents for routine under treatment assessment.  CBCT/MVCT images/Port film x-rays were reviewed.  The chart was checked. Pain and skin breakdown under breast. Some under arm as well. BP impoved post visit with cards.   Physical Findings: Weight: 187 lb 6.4 oz (85.004 kg). Moist desquamation under breast  Impression:  The patient is tolerating radiation.  Plan:  Continue treatment as planned. Gentian violent to inframammary fold. Radiaplex to rest of breas.t

## 2015-05-09 ENCOUNTER — Ambulatory Visit
Admission: RE | Admit: 2015-05-09 | Discharge: 2015-05-09 | Disposition: A | Discharge status: Home / Self Care | Payer: Medicaid Other | Source: Ambulatory Visit | Attending: Radiation Oncology | Admitting: Radiation Oncology

## 2015-05-09 DIAGNOSIS — Z510 Encounter for antineoplastic radiation therapy: Secondary | ICD-10-CM | POA: Diagnosis not present

## 2015-05-10 ENCOUNTER — Ambulatory Visit: Payer: Medicaid Other

## 2015-05-10 ENCOUNTER — Ambulatory Visit
Admission: RE | Admit: 2015-05-10 | Discharge: 2015-05-10 | Disposition: A | Discharge status: Home / Self Care | Payer: Medicaid Other | Source: Ambulatory Visit | Attending: Radiation Oncology | Admitting: Radiation Oncology

## 2015-05-10 ENCOUNTER — Telehealth: Payer: Self-pay | Admitting: Hematology and Oncology

## 2015-05-10 ENCOUNTER — Encounter: Payer: Self-pay | Admitting: *Deleted

## 2015-05-10 ENCOUNTER — Ambulatory Visit (HOSPITAL_BASED_OUTPATIENT_CLINIC_OR_DEPARTMENT_OTHER): Payer: Medicaid Other | Admitting: Hematology and Oncology

## 2015-05-10 ENCOUNTER — Encounter: Payer: Self-pay | Admitting: Hematology and Oncology

## 2015-05-10 VITALS — BP 150/84 | HR 65 | Temp 98.2°F | Resp 18 | Ht 65.0 in | Wt 191.1 lb

## 2015-05-10 DIAGNOSIS — C50411 Malignant neoplasm of upper-outer quadrant of right female breast: Secondary | ICD-10-CM

## 2015-05-10 DIAGNOSIS — Z510 Encounter for antineoplastic radiation therapy: Secondary | ICD-10-CM | POA: Diagnosis not present

## 2015-05-10 MED ORDER — TAMOXIFEN CITRATE 20 MG PO TABS: 20.0000 mg | ORAL_TABLET | Freq: Every day | ORAL | Status: DC

## 2015-05-10 NOTE — Assessment & Plan Note (Signed)
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  Treatment plan: Adjuvant antiestrogen therapy with tamoxifen 20 mg daily 10 years Tamoxifen counseling:We discussed the risks and benefits of tamoxifen. These include but not limited to insomnia, hot flashes, mood changes, vaginal dryness, and weight gain. Although rare, serious side effects including endometrial cancer, risk of blood clots were also discussed. We strongly believe that the benefits far outweigh the risks. Patient understands these risks and consented to starting treatment. Planned treatment duration is 5-10 years.  Return to clinic in 3 months for toxicity check on tamoxifen.

## 2015-05-10 NOTE — Telephone Encounter (Signed)
Per 2nd 9/29 pof added lab for 9/30. Left message for patient. Other appointments remain the same.

## 2015-05-10 NOTE — Progress Notes (Unsigned)
05/10/2015  1630 PREVENT  Received call from Dr. Lindi Adie stating the patient had come in for visit today and she has grade 2 myalgias.  He is not sure if this is neuropathy or possibly related to study medication.  This RN provided MD with the study guidance on what to do for grade 2 myalgias.  Per Dr. Lindi Adie, notify the patient and her have stop study medication.  Have patient come in tomorrow for labs (ALT, AST, and CK). This RN attempted to reach patient by phone.  This RN left voice message with specific instructions to stop study medication and come to [facility] lab tomorrow before her radiation for labs.  This RN contact number was provided.  This RN confirmed with Burna Forts at [facility] on steps to take per protocol page 19.Marland Kitchen

## 2015-05-10 NOTE — Progress Notes (Signed)
[place] Work  Clinical Social Work phoned pt to follow up on assistance applications. CSW left vm to have pt call to set up appt for follow up on this concern that she brought forward. CSW awaits return call.   Clinical Social Work interventions: Follow up on financial assistance issues.   Loren Racer, [place] Worker [place]  [place] Phone: [phone] Fax: [phone]

## 2015-05-10 NOTE — Telephone Encounter (Signed)
Gave patient avs report and appointments for December  °

## 2015-05-10 NOTE — Progress Notes (Addendum)
Patient Care Team: No Pcp Per Patient as PCP - General (General Practice) Rolm Bookbinder, MD as Consulting Physician (General Surgery) Nicholas Lose, MD as Consulting Physician (Hematology and Oncology) Thea Silversmith, MD as Consulting Physician (Radiation Oncology) Holley Bouche, NP as Nurse Practitioner (Nurse Practitioner) Carola Frost, RN as Registered Nurse (Oncology)  DIAGNOSIS: Breast cancer of upper-outer quadrant of right female breast   Staging form: Breast, AJCC 7th Edition     Clinical stage from 07/26/2014: Stage IIB (T2, N1, M0) - Unsigned       Staging comments: Staged at breast conference on 12.16.15      Pathologic: Stage IIA (T1c, N1a, cM0) - Unsigned   SUMMARY OF ONCOLOGIC HISTORY:   Breast cancer of upper-outer quadrant of right female breast   07/13/2014 Mammogram indeterminate mass in the right breast by ultrasound measured 2 cm at 10:00 position   07/20/2014 Initial Diagnosis right breast biopsy: Invasive ductal carcinoma, 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, grade 3,ER/PR positive, HER-2 equivocal ratio 1.55, gene copy #4.35 (being repeated), Ki-67 40%   08/09/2014 - 12/19/2014 Neo-Adjuvant Chemotherapy Dose dense Adriamycin and Cytoxan 4 followed by Taxol 12    Procedure Genetic testing revealed three variant's of unknown significance - MRE11A c.256G>C, PMS2 c.1004A>G, and RAD51C c.200A>G - but was otherwise normal, and did not reveal a deleterious mutation in these genes   01/16/2015 Surgery Rt Lumpectomy: 1.2 cm IDC 1/1 LN Positive eith ECE, Grade 3,Er 100%, PR 15%, Her 2 neg: 1.13 T1cN1M0 Stage 2A    02/08/2015 Surgery Right breast excision: Benign breast tissue; right axillary lymph node dissection: 1/11 lymph nodes positive for micrometastatic disease (tumor deposit 1.5 mm)    Radiation Therapy Adjuvant radiation therapy with Dr. Pablo Ledger    CHIEF COMPLIANT: Follow-up on radiation therapy  INTERVAL HISTORY: Barbara Myers is a 39 year old of megaly with above-mentioned history of right breast cancer treated with neo-adjuvant chemotherapy followed by right lumpectomy followed by right axillary lymph node dissection. She is currently receiving adjuvant radiation therapy with Dr. Pablo Ledger. She is complaining of soreness in the right breast along with that in the axilla. She is very concerned about the subsequent plan of antiestrogen therapy. She does feel anxious to get started on an exercise regimen. She had met with a physical therapist for lymphedema prevention. But she would like to do more activity than what the recommended. She occasionally gets hot flashes. Her menstrual cycles are starting to come back. She has experienced mostly spotting. She has seen a cardiologist and her blood pressure is much improved. She now takes amlodipine.   REVIEW OF SYSTEMS:   Constitutional: Denies fevers, chills or abnormal weight loss Eyes: Denies blurriness of vision Ears, nose, mouth, throat, and face: Denies mucositis or sore throat Respiratory: Denies cough, dyspnea or wheezes Cardiovascular: Denies palpitation, chest discomfort or lower extremity swelling Gastrointestinal:  Denies nausea, heartburn or change in bowel habits Skin: Denies abnormal skin rashes Lymphatics: Denies new lymphadenopathy or easy bruising Neurological:Denies numbness, tingling or new weaknesses Behavioral/Psych: Mood is stable, no new changes  Breast: Pain and discomfort related to radiation All other systems were reviewed with the patient and are negative.  I have reviewed the past medical history, past surgical history, social history and family history with the patient and they are unchanged from previous note.  ALLERGIES:  is allergic to tape.  MEDICATIONS:  Current Outpatient Prescriptions  Medication Sig Dispense Refill  . amLODipine (NORVASC) 5 MG tablet Take  1 tablet (5 mg total) by mouth daily. 30 tablet 3  . Atorvastatin  Calcium (INVESTIGATIONAL ATORVASTATIN/PLACEBO) 40 MG tablet [facility] 17793 Take 1 tablet by mouth daily. Take 1 tablet daily with or without food. 180 tablet 0  . gabapentin (NEURONTIN) 300 MG capsule Take 1 capsule (300 mg total) by mouth 3 (three) times daily. 90 capsule 3  . hyaluronate sodium (RADIAPLEXRX) GEL Apply 1 application topically 2 (two) times daily.    Marland Kitchen LORazepam (ATIVAN) 0.5 MG tablet TAKE 1 TABLET BY MOUTH EVERY 6 HOURS AS NEEDED FOR NAUSEA AND VOMITING 30 tablet 2  . metoprolol (LOPRESSOR) 50 MG tablet Take 1 tablet (50 mg total) by mouth 2 (two) times daily. 30 tablet 3  . non-metallic deodorant (ALRA) MISC Apply 1 application topically daily as needed.     No current facility-administered medications for this visit.    PHYSICAL EXAMINATION: ECOG PERFORMANCE STATUS: 1 - Symptomatic but completely ambulatory  Filed Vitals:   05/10/15 1414  BP: 150/84  Pulse: 65  Temp: 98.2 F (36.8 C)  Resp: 18   Filed Weights   05/10/15 1414  Weight: 191 lb 1.6 oz (86.682 kg)    GENERAL:alert, no distress and comfortable SKIN: skin color, texture, turgor are normal, no rashes or significant lesions EYES: normal, Conjunctiva are pink and non-injected, sclera clear OROPHARYNX:no exudate, no erythema and lips, buccal mucosa, and tongue normal  NECK: supple, thyroid normal size, non-tender, without nodularity LYMPH:  no palpable lymphadenopathy in the cervical, axillary or inguinal LUNGS: clear to auscultation and percussion with normal breathing effort HEART: regular rate & rhythm and no murmurs and no lower extremity edema ABDOMEN:abdomen soft, non-tender and normal bowel sounds Musculoskeletal:no cyanosis of digits and no clubbing  NEURO: alert & oriented x 3 with fluent speech, no focal motor/sensory deficits  LABORATORY DATA:  I have reviewed the data as listed   Chemistry      Component Value Date/Time   NA 137 02/09/2015 0720   NA 141 02/01/2015 0926   K 4.2  02/09/2015 0720   K 3.7 02/01/2015 0926   CL 105 02/09/2015 0720   CO2 26 02/09/2015 0720   CO2 27 02/01/2015 0926   BUN 11 02/09/2015 0720   BUN 11.9 02/01/2015 0926   CREATININE 0.87 02/09/2015 0720   CREATININE 0.9 02/01/2015 0926      Component Value Date/Time   CALCIUM 8.8* 02/09/2015 0720   CALCIUM 9.3 02/01/2015 0926   ALKPHOS 64 02/09/2015 0720   ALKPHOS 92 02/01/2015 0926   AST 22 02/09/2015 0720   AST 44* 02/01/2015 0926   ALT 29 02/09/2015 0720   ALT 69* 02/01/2015 0926   BILITOT 0.6 02/09/2015 0720   BILITOT 0.41 02/01/2015 0926       Lab Results  Component Value Date   WBC 4.8 03/08/2015   HGB 10.7* 03/08/2015   HCT 32.2* 03/08/2015   MCV 88.1 03/08/2015   PLT 326 03/08/2015   NEUTROABS 2.9 03/08/2015   ASSESSMENT & PLAN:  Breast cancer of upper-outer quadrant of right female breast Rt Lumpectomy 01/16/15: 1.2 cm IDC 1/1 LN Positive eith ECE, Grade 3,Er 100%, PR 15%, Her 2 neg: 1.13 T1cN1M0 Stage 2A  (Pre-Op:Right breast invasive ductal carcinoma 2 cm by ultrasound one axillary lymph node biopsy positive, ER positive, PR positive, HER-2 equivocal [employer] HER 2 Negative, grade 3, Ki-67 40%, T2 N1 M0 stage IIB clinical stage) S/P AC X 4 foll by Taxol X 12  Reexcision and axillary  lymph node dissection 02/08/2015 : Benign breast tissue; right axillary lymph node dissection: 1/11 lymph nodes positive for micrometastatic disease (tumor deposit 1.5 mm)  Chemotherapy-induced neuropathy: improving over time. Especially at night times in the evening she develops achiness and discomfort  PREVENT clinical trial: Atorvastatin versus placebo: Patient is experiencing myalgias grade 2. It is unclear if these are related to her prior neuropathy or it may be related to the clinical trial medication. We will obtain lab work tomorrow to assess her liver function as well as CPK levels. We will instruct the patient to stop the trial medication. We're trying to get clarification  from the clinical trials principal investigator regarding the follow-up plan.  Treatment plan: Adjuvant antiestrogen therapy with tamoxifen 20 mg daily 10 years (she will start this 1 month after finishing with radiation therapy)  Tamoxifen counseling:We discussed the risks and benefits of tamoxifen. These include but not limited to insomnia, hot flashes, mood changes, vaginal dryness, and weight gain. Although rare, serious side effects including endometrial cancer, risk of blood clots were also discussed. We strongly believe that the benefits far outweigh the risks. Patient understands these risks and consented to starting treatment. Planned treatment duration is 5-10 years.  Return to clinic in mid December for toxicity check on tamoxifen.  No orders of the defined types were placed in this encounter.   The patient has a good understanding of the overall plan. she agrees with it. she will call with any problems that may develop before the next visit here.   Rulon Eisenmenger, MD     Addendum: After further review we felt that the myalgias are actually related to neuropathy and not related to the study drug. She held the medication just for 1 day until we got the lab work back. Her creatinine kinase and liver function tests were normal and so we are resuming her back on her study drug.

## 2015-05-11 ENCOUNTER — Ambulatory Visit
Admission: RE | Admit: 2015-05-11 | Discharge: 2015-05-11 | Disposition: A | Discharge status: Home / Self Care | Payer: Medicaid Other | Source: Ambulatory Visit | Attending: Radiation Oncology | Admitting: Radiation Oncology

## 2015-05-11 ENCOUNTER — Other Ambulatory Visit (HOSPITAL_BASED_OUTPATIENT_CLINIC_OR_DEPARTMENT_OTHER): Payer: Medicaid Other

## 2015-05-11 ENCOUNTER — Other Ambulatory Visit (HOSPITAL_COMMUNITY)
Admission: RE | Admit: 2015-05-11 | Discharge: 2015-05-11 | Disposition: A | Discharge status: Home / Self Care | Payer: Medicaid Other | Source: Ambulatory Visit | Attending: Hematology and Oncology | Admitting: Hematology and Oncology

## 2015-05-11 ENCOUNTER — Ambulatory Visit (HOSPITAL_COMMUNITY): Payer: Medicaid Other

## 2015-05-11 ENCOUNTER — Encounter: Payer: Self-pay | Admitting: *Deleted

## 2015-05-11 DIAGNOSIS — C50411 Malignant neoplasm of upper-outer quadrant of right female breast: Secondary | ICD-10-CM

## 2015-05-11 DIAGNOSIS — Z510 Encounter for antineoplastic radiation therapy: Secondary | ICD-10-CM | POA: Diagnosis not present

## 2015-05-11 LAB — MAGNESIUM (CC13): Magnesium: 2 mg/dl (ref 1.5–2.5)

## 2015-05-11 LAB — COMPREHENSIVE METABOLIC PANEL (CC13)
ALBUMIN: 3.7 g/dL (ref 3.5–5.0)
ALK PHOS: 80 U/L (ref 40–150)
ALT: 24 U/L (ref 0–55)
AST: 20 U/L (ref 5–34)
Anion Gap: 6 mEq/L (ref 3–11)
BUN: 8.3 mg/dL (ref 7.0–26.0)
CALCIUM: 8.8 mg/dL (ref 8.4–10.4)
CO2: 29 mEq/L (ref 22–29)
CREATININE: 0.8 mg/dL (ref 0.6–1.1)
Chloride: 106 mEq/L (ref 98–109)
EGFR: >90 mL/min/{1.73_m2} (ref >90)
Glucose: 94 mg/dl (ref 70–140)
POTASSIUM: 3.8 meq/L (ref 3.5–5.1)
Sodium: 141 mEq/L (ref 136–145)
TOTAL PROTEIN: 6.4 g/dL (ref 6.4–8.3)
Total Bilirubin: 0.38 mg/dL (ref 0.20–1.20)

## 2015-05-11 LAB — CBC WITH DIFFERENTIAL/PLATELET
BASO%: 0.5 % (ref 0.0–2.0)
BASOS ABS: 0 10*3/uL (ref 0.0–0.1)
EOS%: 3.6 % (ref 0.0–7.0)
Eosinophils Absolute: 0.1 10*3/uL (ref 0.0–0.5)
HCT: 34.3 % — ABNORMAL LOW (ref 34.8–46.6)
HGB: 11.1 g/dL — ABNORMAL LOW (ref 11.6–15.9)
LYMPH%: 13.6 % — ABNORMAL LOW (ref 14.0–49.7)
MCH: 28.4 pg (ref 25.1–34.0)
MCHC: 32.4 g/dL (ref 31.5–36.0)
MCV: 87.7 fL (ref 79.5–101.0)
MONO#: 0.5 10*3/uL (ref 0.1–0.9)
MONO%: 12.3 % (ref 0.0–14.0)
NEUT#: 2.7 10*3/uL (ref 1.5–6.5)
NEUT%: 70 % (ref 38.4–76.8)
Platelets: 203 10*3/uL (ref 145–400)
RBC: 3.91 10*6/uL (ref 3.70–5.45)
RDW: 14.6 % — ABNORMAL HIGH (ref 11.2–14.5)
WBC: 3.9 10*3/uL (ref 3.9–10.3)
lymph#: 0.5 10*3/uL — ABNORMAL LOW (ref 0.9–3.3)

## 2015-05-11 LAB — CK: Total CK: 135 U/L (ref 38–234)

## 2015-05-11 NOTE — Progress Notes (Signed)
05/11/2015  5397 PREVENT This RN spoke with the patient.  She confirmed that she had received my message on 05/10/15.  She stated she had not taken the study drug since the evening ( could not give exact time) of 05/09/15.  She verbalized understanding not to take study drug until notified by Dr. Lindi Adie.  She understands to come to [facility] today for labs before her radiation treatment.  She understands to come back to see this RN for lab results and direction on what to do from Dr. Lindi Adie.    05/11/15 1515  PREVENT The patient had labs drawn today and her ALT, AST, and CK were all normal.  She stated she was somewhat better today than yesterday when she saw Dr. Lindi Adie.  She last took study drug on the evening of 03/08/15 and had been off study drug >24 hours when labs were drawn. The patient described her current symptoms as "pain and aches in the joints/bones of her hands and feet".  She denied pain in her muscles in her legs or arms.  She denied feeling like she had aches like the flu. Per protocol " If labs are in acceptable range (ALT and AST are < 3 x ULN and CK < 2.5 x ULN) study medication should be resumed"  If Grade 2 or 3 myalgias redevelop, study medication should be discontinued.  Per protocol, patient advised to resume study drug and to check in on Monday, when she is here for radiation, ito let MD know if symptoms are worse, better, the same and to inform MD how she is feeling. Dr. Lindi Adie was notified of above.  Study Nurse Monitor at [facility] notified of above.

## 2015-05-14 ENCOUNTER — Ambulatory Visit
Admission: RE | Admit: 2015-05-14 | Discharge: 2015-05-14 | Disposition: A | Discharge status: Home / Self Care | Payer: Medicaid Other | Source: Ambulatory Visit | Attending: Radiation Oncology | Admitting: Radiation Oncology

## 2015-05-14 ENCOUNTER — Encounter: Payer: Self-pay | Admitting: *Deleted

## 2015-05-14 DIAGNOSIS — Z510 Encounter for antineoplastic radiation therapy: Secondary | ICD-10-CM | POA: Diagnosis not present

## 2015-05-14 DIAGNOSIS — C50411 Malignant neoplasm of upper-outer quadrant of right female breast: Secondary | ICD-10-CM

## 2015-05-14 NOTE — Progress Notes (Signed)
05/14/2015  0855  PREVENT study This RN met with the patient while she was at clinic for radiation therapy.  She reported that she started her study drug back on 05/11/15 as advised .  She stated she cannot really tell any difference in how she feels being on the drug versus being off the drug.  She stated she continues to have "pain and aches in the joints/bones of her hands and the toes on her feet,".  She denied pain in the muscles of her legs or arms.  She denied aches like the flu or body aches.  She denied abdominal pains or aches.  She stated that she does not see any difference in her symptoms on or off the drug.  This RN spoke with Dr. Lindi Adie while patient still here and updated him on her current symptoms.  Per Dr. Lindi Adie, have patient continue study drug and advise if symptoms worsen or if any new symptoms appear.  The patient was informed and she verbalized understanding of above instructions.  She thanked this Therapist, sports for the visit.

## 2015-05-15 ENCOUNTER — Ambulatory Visit
Admission: RE | Admit: 2015-05-15 | Discharge: 2015-05-15 | Disposition: A | Discharge status: Home / Self Care | Payer: Medicaid Other | Source: Ambulatory Visit | Attending: Radiation Oncology | Admitting: Radiation Oncology

## 2015-05-15 VITALS — BP 135/85 | HR 58 | Temp 97.9°F | Wt 190.6 lb

## 2015-05-15 DIAGNOSIS — C50411 Malignant neoplasm of upper-outer quadrant of right female breast: Secondary | ICD-10-CM

## 2015-05-15 DIAGNOSIS — Z510 Encounter for antineoplastic radiation therapy: Secondary | ICD-10-CM | POA: Diagnosis not present

## 2015-05-15 NOTE — Progress Notes (Signed)
Weekly Management Note Current Dose: 51  Gy  Projected Dose: 61 Gy   Narrative:  The patient presents for routine under treatment assessment.  CBCT/MVCT images/Port film x-rays were reviewed.  The chart was checked. Less pain under breast with gentian violet. Still pain on lateral aspect of breast. Taking pain medications as needed. No fevers.  No longer wearing underwire bras.   Physical Findings: Moist desquamation in inframammary fold.   Impression:  The patient is tolerating radiation.  Plan:  Continue treatment as planned. Gentian violet today and prn.   This document serves as a record of services personally performed by Thea Silversmith, MD. It was created on her behalf by Darcus Austin, a trained medical scribe. The creation of this record is based on the scribe's personal observations and the provider's statements to them. This document has been checked and approved by the attending provider.

## 2015-05-15 NOTE — Addendum Note (Signed)
Encounter addended by: Norm Salt, RN on: 05/15/2015 12:57 PM<BR>     Documentation filed: Tonette Bihari Section, Chief Complaint Section

## 2015-05-16 ENCOUNTER — Ambulatory Visit
Admission: RE | Admit: 2015-05-16 | Discharge: 2015-05-16 | Disposition: A | Discharge status: Home / Self Care | Payer: Medicaid Other | Source: Ambulatory Visit | Attending: Radiation Oncology | Admitting: Radiation Oncology

## 2015-05-16 DIAGNOSIS — Z510 Encounter for antineoplastic radiation therapy: Secondary | ICD-10-CM | POA: Diagnosis not present

## 2015-05-17 ENCOUNTER — Ambulatory Visit
Admission: RE | Admit: 2015-05-17 | Discharge: 2015-05-17 | Disposition: A | Discharge status: Home / Self Care | Payer: Medicaid Other | Source: Ambulatory Visit | Attending: Radiation Oncology | Admitting: Radiation Oncology

## 2015-05-17 DIAGNOSIS — Z510 Encounter for antineoplastic radiation therapy: Secondary | ICD-10-CM | POA: Diagnosis not present

## 2015-05-18 ENCOUNTER — Ambulatory Visit (HOSPITAL_COMMUNITY): Payer: Medicaid Other | Attending: Cardiology

## 2015-05-18 ENCOUNTER — Ambulatory Visit
Admission: RE | Admit: 2015-05-18 | Discharge: 2015-05-18 | Disposition: A | Discharge status: Home / Self Care | Payer: Medicaid Other | Source: Ambulatory Visit | Attending: Radiation Oncology | Admitting: Radiation Oncology

## 2015-05-18 ENCOUNTER — Other Ambulatory Visit: Payer: Self-pay

## 2015-05-18 DIAGNOSIS — I517 Cardiomegaly: Secondary | ICD-10-CM | POA: Diagnosis not present

## 2015-05-18 DIAGNOSIS — I10 Essential (primary) hypertension: Secondary | ICD-10-CM | POA: Insufficient documentation

## 2015-05-18 DIAGNOSIS — I272 Other secondary pulmonary hypertension: Secondary | ICD-10-CM | POA: Diagnosis present

## 2015-05-18 DIAGNOSIS — Z510 Encounter for antineoplastic radiation therapy: Secondary | ICD-10-CM | POA: Diagnosis not present

## 2015-05-18 DIAGNOSIS — Z87891 Personal history of nicotine dependence: Secondary | ICD-10-CM | POA: Diagnosis not present

## 2015-05-21 ENCOUNTER — Ambulatory Visit
Admission: RE | Admit: 2015-05-21 | Discharge: 2015-05-21 | Disposition: A | Discharge status: Home / Self Care | Payer: Medicaid Other | Source: Ambulatory Visit | Attending: Radiation Oncology | Admitting: Radiation Oncology

## 2015-05-21 DIAGNOSIS — Z510 Encounter for antineoplastic radiation therapy: Secondary | ICD-10-CM | POA: Diagnosis not present

## 2015-05-22 ENCOUNTER — Ambulatory Visit
Admission: RE | Admit: 2015-05-22 | Discharge: 2015-05-22 | Disposition: A | Discharge status: Home / Self Care | Payer: Medicaid Other | Source: Ambulatory Visit | Attending: Radiation Oncology | Admitting: Radiation Oncology

## 2015-05-22 VITALS — BP 137/102 | HR 65 | Temp 98.4°F | Wt 191.4 lb

## 2015-05-22 DIAGNOSIS — C50411 Malignant neoplasm of upper-outer quadrant of right female breast: Secondary | ICD-10-CM | POA: Insufficient documentation

## 2015-05-22 DIAGNOSIS — Z510 Encounter for antineoplastic radiation therapy: Secondary | ICD-10-CM | POA: Diagnosis not present

## 2015-05-22 MED ORDER — RADIAPLEXRX EX GEL
Freq: Once | CUTANEOUS | Status: AC
  Administered 2015-05-22: 13:00:00 via TOPICAL

## 2015-05-22 NOTE — Progress Notes (Addendum)
  Radiation Oncology         [phone] ________________________________  Name: Barbara Myers MRN: [id number]  Date: 05/22/2015  DOB: 08-14-1975  End of Treatment Note  Diagnosis:  Breast cancer of upper-outer quadrant of right female breast [facility])   Staging form: Breast, AJCC 7th Edition     Clinical stage from 07/26/2014: Stage IIB (T2, N1, M0) - Unsigned       Staging comments: Staged at breast conference on 12.16.15      Pathologic: Stage IIA (T1c, N1a, cM0) - Unsigned      Indication for treatment:  Curative    Radiation treatment dates:   04/05/15-05/22/15  Site/dose:    Right breast / 81 Gray @ 1.8 Pearline Cables per fraction x 25 fractions Right supraclavicular fossa / 45 Gy @1 .8 Gy per fraction x 25 fractions Right breast boost / 16 Gray at [employer] per fraction x 8 fractions  Beams/energy:  Opposed Tangents / 6 and 10 MV photons LAO / 10 MV photons 3 field plan with 10 and 6 MV photons.   Narrative: The patient tolerated radiation treatment relatively well.   She had significant moist desquamation in the inframammary fold which was treated with gentian violet.  This was almost completely healed at the end of her treatment.   Plan: The patient has completed radiation treatment. The patient will return to radiation oncology clinic for routine followup in one month. I advised them to call or return sooner if they have any questions or concerns related to their recovery or treatment. She will start tamoxifen.   ------------------------------------------------  Thea Silversmith, MD

## 2015-05-22 NOTE — Progress Notes (Signed)
Radiation Oncology         [phone] ________________________________  Name: Barbara Myers      MRN: [id number]          Date: 03/28/2015              DOB: [birth year]  Optical Surface Tracking Plan:  Since intensity modulated radiotherapy (IMRT) and 3D conformal radiation treatment methods are predicated on accurate and precise positioning for treatment, intrafraction motion monitoring is medically necessary to ensure accurate and safe treatment delivery.  The ability to quantify intrafraction motion without excessive ionizing radiation dose can only be performed with optical surface tracking. Accordingly, surface imaging offers the opportunity to obtain 3D measurements of patient position throughout IMRT and 3D treatments without excessive radiation exposure.  I am ordering optical surface tracking for this patient's upcoming course of radiotherapy. ________________________________ Signature   Reference:   Ursula Alert, J, et al. Surface imaging-based analysis of intrafraction motion for breast radiotherapy patients.Journal of [place], n. 6, nov. 2014. ISSN [id number].   Available at: <http://www.jacmp.org/index.php/jacmp/article/view/4957>.

## 2015-05-22 NOTE — Progress Notes (Signed)
Name: Barbara Myers   MRN: [id number]  Date:  05/22/2015   DOB: 1975-09-15  Status:outpatient    DIAGNOSIS: Breast cancer of upper-outer quadrant of right female breast [facility])   Staging form: Breast, AJCC 7th Edition     Clinical stage from 07/26/2014: Stage IIB (T2, N1, M0) - Unsigned       Staging comments: Staged at breast conference on 12.16.15      Pathologic: Stage IIA (T1c, N1a, cM0) - Unsigned   CONSENT VERIFIED: yes   SET UP: Patient is setup supine   IMMOBILIZATION:  The following immobilization was used:Custom Moldable Pillow, breast board.   NARRATIVE: Barbara Myers underwent complex simulation and treatment planning for her boost treatment today.  Her tumor volume was outlined on the planning CT scan.  Due to the depth of her cavity, electrons could not be used and a photon plan was developed. The plan will be prescribed to the  isodose line.   I personally supervised and approved the creation of 3 unique MLCs comprising 3   treatment devices.

## 2015-05-22 NOTE — Addendum Note (Signed)
Encounter addended by: Thea Silversmith, MD on: 05/22/2015 12:50 PM<BR>     Documentation filed: Notes Section

## 2015-05-22 NOTE — Progress Notes (Signed)
Patient has completed 33 of 33 treatments to right breast.Skin is mostly healed except 2 small areas less than size of quarter under right breast.Patient currently applying neosporin in this area.Given radiaplex to apply over next 2 weeks and then may change to lotion with vitamin e.One month follow up given. BP 137/102 mmHg  Pulse 65  Temp(Src) 98.4 F (36.9 C)  Wt 191 lb 6.4 oz (86.818 kg)

## 2015-05-23 ENCOUNTER — Telehealth (HOSPITAL_COMMUNITY): Payer: Self-pay

## 2015-05-23 ENCOUNTER — Encounter (HOSPITAL_COMMUNITY): Payer: Medicaid Other

## 2015-05-23 NOTE — Telephone Encounter (Signed)
Left message on voice mail about patient's echo results.  A pre-auth is in process for MRI.  Let patient know with message.  Cancelled patient's 12N appointment as requested.

## 2015-05-24 ENCOUNTER — Telehealth (HOSPITAL_COMMUNITY): Payer: Self-pay

## 2015-05-24 ENCOUNTER — Other Ambulatory Visit: Payer: Self-pay | Admitting: Adult Health

## 2015-05-24 DIAGNOSIS — C50411 Malignant neoplasm of upper-outer quadrant of right female breast: Secondary | ICD-10-CM

## 2015-05-24 NOTE — Telephone Encounter (Signed)
Patient calling back for test results and MRI plan questions.  Available after 130pm [phone]).  Return call

## 2015-05-24 NOTE — Progress Notes (Signed)
Barbara Myers is getting a pre-auth or MRI.  Patient aware of results and the plan for a MRI when authorization received

## 2015-05-24 NOTE — Telephone Encounter (Signed)
Returned patient's phone call.  Results of echo given with explanation and the need to do MRI  Explained that we were working on getting pre-auth for insurance company for MRI and after that is complete we will call her back.

## 2015-05-25 ENCOUNTER — Telehealth: Payer: Self-pay | Admitting: Hematology and Oncology

## 2015-05-25 NOTE — Telephone Encounter (Signed)
lvm for pt regarding to DEC appt.... °

## 2015-05-28 ENCOUNTER — Telehealth: Payer: Self-pay | Admitting: *Deleted

## 2015-05-28 NOTE — Telephone Encounter (Signed)
Left voice message with patient to please contact this RN.  Contact information provided. Marcellus Scott, RN

## 2015-05-30 ENCOUNTER — Other Ambulatory Visit (HOSPITAL_COMMUNITY): Payer: Self-pay | Admitting: Cardiology

## 2015-05-30 DIAGNOSIS — I5022 Chronic systolic (congestive) heart failure: Secondary | ICD-10-CM

## 2015-05-31 ENCOUNTER — Encounter: Payer: Self-pay | Admitting: Radiation Oncology

## 2015-06-05 ENCOUNTER — Encounter: Payer: Self-pay | Admitting: Cardiology

## 2015-06-08 ENCOUNTER — Encounter: Payer: Self-pay | Admitting: *Deleted

## 2015-06-12 NOTE — Progress Notes (Signed)
[place] Work  Clinical Social Work met with pt to complete [employer] application for financial needs.  CSW educated pt that [employer] would not approve application due to pt not being in active treatment currently. Pt aware and stated understanding. Overall, Pt reports to be doing better and her kids are doing well. Pt plans to enroll in [place] program in the coming weeks and registered for [facility] today. Pt agrees to reach out as needed as she moves into survivorship.   Clinical Social Work interventions: Resource education  Loren Racer, [place] Worker [place]  [place] Phone: [phone] Fax: [phone]

## 2015-06-18 ENCOUNTER — Ambulatory Visit (HOSPITAL_COMMUNITY): Admission: RE | Admit: 2015-06-18 | Payer: Medicaid Other | Source: Ambulatory Visit

## 2015-06-27 NOTE — Progress Notes (Signed)
   Department of Radiation Oncology  Phone:  [phone] Fax:        [phone]   Name: Barbara Myers MRN: [id number]  DOB: 1976-06-23  Date: 06/28/2015  Follow Up Visit Note  Diagnosis: Breast cancer of upper-outer quadrant of right female breast [facility])   Staging form: Breast, AJCC 7th Edition     Clinical stage from 07/26/2014: Stage IIB (T2, N1, M0) - Unsigned       Staging comments: Staged at breast conference on 12.16.15      Pathologic: Stage IIA (T1c, N1a, cM0) - Unsigned  Summary and Interval since last radiation: 45 Gy in 25 fractions to right breast, 45 Gy in 25 fractions to right supraclavicular fossa, plus boost completed 05/22/15.  Interval History: Barbara Myers presents today for routine followup. She has not started the Tamoxifen yet. She mentions her fatigue is getting better. When she sits for a long amount of time, her hips and knees start hurting. When she started walking again, she had lymphedema in her right arm. She has started exercising, walking three times a week. She has a lymphedema sleeve.    Physical Exam:  Filed Vitals:   06/28/15 1402  BP: 150/93  Pulse: 63  Temp: 98.1 F (36.7 C)  TempSrc: Oral  Resp: 12  Weight: 194 lb 11.2 oz (88.315 kg)  SpO2: 100%   She has hyperpigmentation in the inframammory fold and axilla but her skin  is well healed.  IMPRESSION: Barbara Myers is a 39 y.o. female with Stage IIB breast cancer of upper-outer quadrant of right breast.  PLAN:  She is doing well. We discussed the need for yearly mammograms which she can schedule with her OBGYN or with medical oncology. We discussed the need for sun protection in the treated area.  She can always call me with questions.  I will follow up with her on an as needed basis. She is following up with Dr. Lindi Adie in December. We discussed survivorship and its benefits and she has an appointment scheduled. She will be [facility] to [facility].    Thea Silversmith, MD    This document serves as a  record of services personally performed by Thea Silversmith, MD. It was created on her behalf by  Lendon Collar, a trained medical scribe. The creation of this record is based on the scribe's personal observations and the provider's statements to them. This document has been checked and approved by the attending provider.

## 2015-06-28 ENCOUNTER — Ambulatory Visit
Admission: RE | Admit: 2015-06-28 | Discharge: 2015-06-28 | Disposition: A | Discharge status: Home / Self Care | Payer: Medicaid Other | Source: Ambulatory Visit | Attending: Radiation Oncology | Admitting: Radiation Oncology

## 2015-06-28 ENCOUNTER — Encounter: Payer: Self-pay | Admitting: Radiation Oncology

## 2015-06-28 VITALS — BP 150/93 | HR 63 | Temp 98.1°F | Resp 12 | Wt 194.7 lb

## 2015-06-28 DIAGNOSIS — C50411 Malignant neoplasm of upper-outer quadrant of right female breast: Secondary | ICD-10-CM

## 2015-06-28 NOTE — Progress Notes (Signed)
PAIN: She is currently in no pain. Denies pain but does reports "sharp" intermittent pains.  SKIN: Pt right breast- positive for Hyperpigmentation and breast tenderness.  Pt reports edema over right upper and lower arm. She reports she does have a compression sleeve that wear occasionally at night.   Pt continues to apply shea butter to treatment field.  OTHER: Pt complains of fatigue and weakness. BP 150/93 mmHg  Pulse 63  Temp(Src) 98.1 F (36.7 C) (Oral)  Resp 12  Wt 194 lb 11.2 oz (88.315 kg)  SpO2 100% Wt Readings from Last 3 Encounters:  06/28/15 194 lb 11.2 oz (88.315 kg)  05/22/15 191 lb 6.4 oz (86.818 kg)  05/15/15 190 lb 9.6 oz (86.456 kg)   Pt reports: Yes No Comments  Tamoxifen [x]  []  Going to start on thanksgiving  Arimidex []  []    Mammogram [x]   Date: 12/26/14 []    Last Med Onc: Gudena 05/10/15, next 07/26/15

## 2015-07-24 ENCOUNTER — Other Ambulatory Visit: Payer: Self-pay | Admitting: Hematology and Oncology

## 2015-07-24 NOTE — Telephone Encounter (Signed)
Per Dr. Gudena 

## 2015-07-25 ENCOUNTER — Encounter: Payer: Self-pay | Admitting: *Deleted

## 2015-07-25 ENCOUNTER — Telehealth: Payer: Self-pay | Admitting: Hematology and Oncology

## 2015-07-25 DIAGNOSIS — C50411 Malignant neoplasm of upper-outer quadrant of right female breast: Secondary | ICD-10-CM

## 2015-07-25 MED ORDER — INV-ATORVASTATIN/PLACEBO 40 MG TABS WAKE FOREST WF 98213: 1.0000 | ORAL_TABLET | Freq: Every day | ORAL | Status: DC

## 2015-07-25 NOTE — Telephone Encounter (Signed)
Patient called to cx appointments for 12/15 with Marcellus Scott and VG - per patient she cannot get off work - Gina aware. Due to patient seeing HM 12/23 f/u with VG not rescheduled. Patient will be given next f/u with VG when she see HM. Appointment with Barnett Applebaum r/s to 12/23 prior to LTS visit with HM. Patient aware of changes and has new date/time - Barnett Applebaum also aware.

## 2015-07-26 ENCOUNTER — Ambulatory Visit: Payer: Medicaid Other | Admitting: Hematology and Oncology

## 2015-07-26 ENCOUNTER — Encounter: Payer: Medicaid Other | Admitting: *Deleted

## 2015-07-27 ENCOUNTER — Other Ambulatory Visit: Payer: Self-pay

## 2015-07-30 ENCOUNTER — Telehealth: Payer: Self-pay | Admitting: Hematology and Oncology

## 2015-07-30 NOTE — Telephone Encounter (Signed)
Called and left a message with dr Lindi Adie appointment per pof

## 2015-07-31 ENCOUNTER — Telehealth: Payer: Self-pay | Admitting: Nurse Practitioner

## 2015-07-31 NOTE — Telephone Encounter (Signed)
Called and left message requesting return call regarding patient's upcoming appointment in Survivorship for discussion of survivorship care plan.  Appointment scheduled for same day as Dr. Lindi Adie (Friday 08/03/2015) and needs to be rescheduled.  Will coordinate with patient's schedule to find date / time that works for her that is not on same day as another appointment in medical oncology.  Will await return call from patient.

## 2015-08-01 ENCOUNTER — Telehealth: Payer: Self-pay | Admitting: *Deleted

## 2015-08-01 ENCOUNTER — Telehealth: Payer: Self-pay | Admitting: Nurse Practitioner

## 2015-08-01 NOTE — Telephone Encounter (Signed)
08/01/2015  1450 Left voice message with patient asking to please return call.  Contact name and number was provided.

## 2015-08-01 NOTE — Telephone Encounter (Signed)
Received return call from Ms. Markgraf who cannot keep any appointment on this Friday 08/02/16 including current appointments with Dr. Lindi Adie and Marcellus Scott RN in research.  Transferred patient to main number so that she could connect with scheduling to reschedule visit.  Will meet with her at that time to provide her copy of her survivorship care plan document at her request.  Notified Marcellus Scott RN of patient's plan.  Pt without other questions at this time.

## 2015-08-02 ENCOUNTER — Telehealth: Payer: Self-pay | Admitting: Nurse Practitioner

## 2015-08-02 NOTE — Telephone Encounter (Signed)
Reviewed appointments to see if patient had been able to reschedule office visit with Dr. Lindi Adie following her call yesterday (08/01/15) saying that she would not be able to keep appointment for Friday 08/03/2015 due to her work schedule.  Appointment still active for 08/03/2015, so I canceled it in the computer and called the patient to ask that she call ASAP to reschedule her visit.  Left message asking for patient to call [phone] ASAP to reschedule her visit reinforcing that we needed to see her in follow up. Will route to Dr. Lindi Adie and Marcellus Scott RN in [place] so that they are aware.

## 2015-08-03 ENCOUNTER — Encounter: Payer: Medicaid Other | Admitting: *Deleted

## 2015-08-03 ENCOUNTER — Encounter: Payer: Medicaid Other | Admitting: Nurse Practitioner

## 2015-08-03 ENCOUNTER — Ambulatory Visit: Payer: Medicaid Other | Admitting: Hematology and Oncology

## 2015-08-03 ENCOUNTER — Telehealth: Payer: Self-pay | Admitting: *Deleted

## 2015-08-03 NOTE — Telephone Encounter (Signed)
08/03/2015 1310  PREVENT Left voice message with patient asking to please return phone call.  The patient is due a new bottle of study drug along with the 12 month visit for the PREVENT study.  This RN has left several messages in an attempt to re-schedule the 12 month visit. Contact information was left in voice message for patient.

## 2015-08-04 ENCOUNTER — Other Ambulatory Visit: Payer: Self-pay | Admitting: Hematology and Oncology

## 2015-08-07 ENCOUNTER — Other Ambulatory Visit: Payer: Self-pay | Admitting: *Deleted

## 2015-08-07 DIAGNOSIS — C50411 Malignant neoplasm of upper-outer quadrant of right female breast: Secondary | ICD-10-CM

## 2015-08-07 MED ORDER — METOPROLOL TARTRATE 50 MG PO TABS: 50.0000 mg | ORAL_TABLET | Freq: Two times a day (BID) | ORAL | Status: DC

## 2015-08-08 ENCOUNTER — Telehealth: Payer: Self-pay | Admitting: Hematology and Oncology

## 2015-08-08 ENCOUNTER — Other Ambulatory Visit: Payer: Self-pay | Admitting: Nurse Practitioner

## 2015-08-08 NOTE — Telephone Encounter (Signed)
Patient called in and request to reschedule her appointments,will notify Gina/research

## 2015-08-14 ENCOUNTER — Encounter: Payer: Self-pay | Admitting: Cardiology

## 2015-08-14 ENCOUNTER — Telehealth (HOSPITAL_COMMUNITY): Payer: Self-pay | Admitting: Vascular Surgery

## 2015-08-14 NOTE — Telephone Encounter (Signed)
Left pt message to make a f/u appt w/ [facility] after MRI

## 2015-08-15 ENCOUNTER — Telehealth: Payer: Self-pay | Admitting: Nurse Practitioner

## 2015-08-15 NOTE — Telephone Encounter (Signed)
Called patient regarding her scheduled appointment to see this provider in the Survivorship Clinic 08/16/15. As she is scheduled to see Dr. Lindi Adie in medical oncology at [place] on this same day, I have canceled her appointment to see this provider and will meet with her during her visit with Dr. Lindi Adie on 08/16/15.  This provider was unable to reach Ms. Kreiter by phone but left a message detailing these plans on her voice mail.  Again, this provider will meet with her during her visit with Dr. Lindi Adie 08/16/15 to discuss her survivorship care plan. Provided contact information to patient via voice mail in the event she had questions.

## 2015-08-16 ENCOUNTER — Encounter: Payer: Medicaid Other | Admitting: Nurse Practitioner

## 2015-08-16 ENCOUNTER — Encounter: Payer: Self-pay | Admitting: Nurse Practitioner

## 2015-08-16 ENCOUNTER — Encounter: Payer: Self-pay | Admitting: Hematology and Oncology

## 2015-08-16 ENCOUNTER — Telehealth: Payer: Self-pay | Admitting: Hematology and Oncology

## 2015-08-16 ENCOUNTER — Encounter: Payer: Self-pay | Admitting: *Deleted

## 2015-08-16 ENCOUNTER — Ambulatory Visit (HOSPITAL_BASED_OUTPATIENT_CLINIC_OR_DEPARTMENT_OTHER): Payer: Medicaid Other | Admitting: Hematology and Oncology

## 2015-08-16 VITALS — BP 144/97 | HR 67 | Temp 97.7°F | Resp 18 | Ht 65.0 in | Wt 194.7 lb

## 2015-08-16 DIAGNOSIS — C50411 Malignant neoplasm of upper-outer quadrant of right female breast: Secondary | ICD-10-CM

## 2015-08-16 DIAGNOSIS — G620 Drug-induced polyneuropathy: Secondary | ICD-10-CM | POA: Diagnosis not present

## 2015-08-16 DIAGNOSIS — I10 Essential (primary) hypertension: Secondary | ICD-10-CM | POA: Diagnosis not present

## 2015-08-16 DIAGNOSIS — Z170 Estrogen receptor positive status [ER+]: Secondary | ICD-10-CM | POA: Diagnosis not present

## 2015-08-16 DIAGNOSIS — T451X5A Adverse effect of antineoplastic and immunosuppressive drugs, initial encounter: Secondary | ICD-10-CM

## 2015-08-16 DIAGNOSIS — Z79810 Long term (current) use of selective estrogen receptor modulators (SERMs): Secondary | ICD-10-CM

## 2015-08-16 DIAGNOSIS — Z006 Encounter for examination for normal comparison and control in clinical research program: Secondary | ICD-10-CM | POA: Diagnosis not present

## 2015-08-16 NOTE — Progress Notes (Signed)
Patient Care Team: No Pcp Per Patient as PCP - General (General Practice) Rolm Bookbinder, MD as Consulting Physician (General Surgery) Nicholas Lose, MD as Consulting Physician (Hematology and Oncology) Thea Silversmith, MD as Consulting Physician (Radiation Oncology) Holley Bouche, NP as Nurse Practitioner (Nurse Practitioner) Carola Frost, RN as Registered Nurse (Oncology) Sylvan Cheese, NP as Nurse Practitioner (Hematology and Oncology)  DIAGNOSIS: Breast cancer of upper-outer quadrant of right female breast [facility])   Staging form: Breast, AJCC 7th Edition     Clinical stage from 07/26/2014: Stage IIB (T2, N1, M0) - Unsigned       Staging comments: Staged at breast conference on 12.16.15      Pathologic: Stage IIA (T1c, N1a, cM0) - Unsigned   SUMMARY OF ONCOLOGIC HISTORY:   Breast cancer of upper-outer quadrant of right female breast ([place])   07/13/2014 Mammogram Right breast: indeterminate mass measuring 2 cm at 10:00 position   07/20/2014 Initial Biopsy Right breast bx: Invasive ductal carcinoma, grade 3, ER+ (100%), PR+ (80%), HER-2 equivocal ratio 1.55 (repeated and negative w/ ratio 1.20), Ki-67 46%. Rt axillary LN bx: metastatic carcinoma (1/1).   07/31/2014 Procedure Genetic testing revealed three variants of unknown significance - MRE11A c.256G>C, PMS2 c.1004A>G, and RAD51C c.200A>G - but was otherwise normal, and did not reveal a deleterious mutation in these genes   08/01/2014 Breast MRI Right breast: enhancement measuring 3.9 x 2.7 x 2.5 cm;  no other areas of enhancement in right breast.  None in left breast.  Right axillary LN measuring 7 mm corresponding with biopsied node.   08/01/2014 Clinical Stage Stage IIB: T2 N1   08/09/2014 - 12/19/2014 Neo-Adjuvant Chemotherapy Dose dense Adriamycin and Cytoxan 4 followed by Taxol 12   12/26/2014 Breast MRI Decreased size of right breast malignancy, now measuring up to 3.8 cm AP, previously measured up to 4.6 cm AP  when remeasured in a similar manner.  No abnormal appearing LN (including previously seen right axillary node)   01/16/2015 Definitive Surgery Right Lumpectomy/SLNB Donne Hazel): IDC, measuring 1.2 cm,  1/3 LN Positive with ECE, Grade 3, ER+ (100%), PR+ (15%), HER/2 neg: (ratio 1.13)    01/16/2015 Pathologic Stage Stage IIA (ypT1c ypN1 M0)   02/08/2015 Surgery Right breast post margin excision/ALD: Benign breast tissue; right axillary lymph node dissection: 1/11 lymph nodes positive for micrometastatic disease (tumor deposit 1.5 mm)   04/05/2015 - 05/22/2015 Radiation Therapy Adjuvant RT Pablo Ledger): Right breast 45 Gy over 25 fractions. Right supraclavicular fossa 45 Gy over 25 fractions. Right breast boost: 16 Gy over 8 fractions. Total dose: 61 Gy.   06/22/2015 -  Anti-estrogen oral therapy Tamoxifen 20 mg daily. Planned duration of therapy 10 years.    CHIEF COMPLIANT:  Follow-up on tamoxifen  INTERVAL HISTORY: Barbara Myers is a  40 year old with above-mentioned history of right breast cancer 3 with surgery radiation and is currently on tamoxifen therapy. She appears to be tolerating tamoxifen fairly well except for occasional hot flashes and intermittent myalgias. Her major complaint is leg cramps and achiness of the end of a long day. 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  REVIEW OF SYSTEMS:   Constitutional: Denies fevers, chills or abnormal weight loss Eyes: Denies blurriness of vision Ears, nose, mouth, throat, and face: Denies mucositis or sore throat Respiratory: Denies cough, dyspnea or wheezes Cardiovascular: Denies palpitation, chest discomfort Gastrointestinal:  Denies nausea, heartburn or change in bowel habits Skin: Denies abnormal skin rashes Lymphatics:  Denies new lymphadenopathy or easy bruising Neurological:Denies numbness, tingling or new weaknesses Behavioral/Psych: Mood is stable, no new changes  Extremities: No lower extremity  edema Breast:  denies any pain or lumps or nodules in either breasts All other systems were reviewed with the patient and are negative.  I have reviewed the past medical history, past surgical history, social history and family history with the patient and they are unchanged from previous note.  ALLERGIES:  is allergic to tape.  MEDICATIONS:  Current Outpatient Prescriptions  Medication Sig Dispense Refill  . amLODipine (NORVASC) 5 MG tablet Take 1 tablet (5 mg total) by mouth daily. 30 tablet 3  . Atorvastatin Calcium (INVESTIGATIONAL ATORVASTATIN/PLACEBO) 40 MG tablet [facility] [id number] Take 1 tablet by mouth daily. Take 1 tablet daily with or without food. 180 tablet 0  . Atorvastatin Calcium (INVESTIGATIONAL ATORVASTATIN/PLACEBO) 40 MG tablet [facility] [id number] Take 1 tablet by mouth daily. Take 1 tablet daily with or without food. 180 tablet 0  . gabapentin (NEURONTIN) 300 MG capsule Take 1 capsule (300 mg total) by mouth 3 (three) times daily. 90 capsule 3  . hyaluronate sodium (RADIAPLEXRX) GEL Apply 1 application topically 2 (two) times daily.    Marland Kitchen LORazepam (ATIVAN) 0.5 MG tablet TAKE 1 TABLET BY MOUTH EVERY 6 HOURS AS NEEDED FOR NAUSEA AND VOMITING 30 tablet 2  . metoprolol (LOPRESSOR) 50 MG tablet Take 1 tablet (50 mg total) by mouth 2 (two) times daily. 60 tablet 0  . non-metallic deodorant (ALRA) MISC Apply 1 application topically daily as needed.    . tamoxifen (NOLVADEX) 20 MG tablet Take 1 tablet (20 mg total) by mouth daily. (Patient not taking: Reported on 05/22/2015) 90 tablet 3   No current facility-administered medications for this visit.    PHYSICAL EXAMINATION: ECOG PERFORMANCE STATUS: 1 - Symptomatic but completely ambulatory  Filed Vitals:   08/16/15 0924  BP: 144/97  Pulse: 67  Temp: 97.7 F (36.5 C)  Resp: 18   Filed Weights   08/16/15 0924  Weight: 194 lb 11.2 oz (88.315 kg)    GENERAL:alert, no distress and comfortable SKIN: skin color,  texture, turgor are normal, no rashes or significant lesions EYES: normal, Conjunctiva are pink and non-injected, sclera clear OROPHARYNX:no exudate, no erythema and lips, buccal mucosa, and tongue normal  NECK: supple, thyroid normal size, non-tender, without nodularity LYMPH:  no palpable lymphadenopathy in the cervical, axillary or inguinal LUNGS: clear to auscultation and percussion with normal breathing effort HEART: regular rate & rhythm and no murmurs and no lower extremity edema ABDOMEN:abdomen soft, non-tender and normal bowel sounds MUSCULOSKELETAL:no cyanosis of digits and no clubbing  NEURO: alert & oriented x 3 with fluent speech, no focal motor/sensory deficits EXTREMITIES: No lower extremity edema BREAST: No palpable masses or nodules in either right or left breasts. No palpable axillary supraclavicular or infraclavicular adenopathy no breast tenderness or nipple discharge. (exam performed in the presence of a chaperone)  LABORATORY DATA:  I have reviewed the data as listed   Chemistry      Component Value Date/Time   NA 141 05/11/2015 1352   NA 137 02/09/2015 0720   K 3.8 05/11/2015 1352   K 4.2 02/09/2015 0720   CL 105 02/09/2015 0720   CO2 29 05/11/2015 1352   CO2 26 02/09/2015 0720   BUN 8.3 05/11/2015 1352   BUN 11 02/09/2015 0720   CREATININE 0.8 05/11/2015 1352   CREATININE 0.87 02/09/2015 0720  Component Value Date/Time   CALCIUM 8.8 05/11/2015 1352   CALCIUM 8.8* 02/09/2015 0720   ALKPHOS 80 05/11/2015 1352   ALKPHOS 64 02/09/2015 0720   AST 20 05/11/2015 1352   AST 22 02/09/2015 0720   ALT 24 05/11/2015 1352   ALT 29 02/09/2015 0720   BILITOT 0.38 05/11/2015 1352   BILITOT 0.6 02/09/2015 0720       Lab Results  Component Value Date   WBC 3.9 05/11/2015   HGB 11.1* 05/11/2015   HCT 34.3* 05/11/2015   MCV 87.7 05/11/2015   PLT 203 05/11/2015   NEUTROABS 2.7 05/11/2015     ASSESSMENT & PLAN:  Breast cancer of upper-outer quadrant of  right female breast ([place]) Rt Lumpectomy 01/16/15: 1.2 cm IDC 1/1 LN Positive eith ECE, Grade 3,Er 100%, PR 15%, Her 2 neg: 1.13 T1cN1M0 Stage 2A  (Pre-Op:Right breast invasive ductal carcinoma 2 cm by ultrasound one axillary lymph node biopsy positive, ER positive, PR positive, HER-2 equivocal [employer] HER 2 Negative, grade 3, Ki-67 40%, T2 N1 M0 stage IIB clinical stage) S/P AC X 4 foll by Taxol X 12  Reexcision and axillary lymph node dissection 02/08/2015 : Benign breast tissue; right axillary lymph node dissection: 1/11 lymph nodes positive for micrometastatic disease (tumor deposit 1.5 mm)  Chemotherapy-induced neuropathy: improving over time. Especially at night times in the evening she develops achiness and discomfort. I encouraged her to take multivitamin with B6 and B-12.  We also discussed the role of vitamin D as well as turmeric and breast cancer risk reduction.  PREVENT clinical trial: Atorvastatin versus placebo: Patient is experiencing myalgias grade 1,  Which could be related to prior chemotherapy-induced neuropathy versus the study drug. She is scheduled to undergo cardiac MRI soon.  Hypertension: patient is currently seeing cardiology. She is currently on metoprolol and Norvasc. Her blood pressure still continues to remain high. She has occasional palpitations as well especially when she sleeps.  Current Treatment: Adjuvant antiestrogen therapy with tamoxifen 20 mg daily 10 years  Started 06/22/2015 Tamoxifen toxicities: 1.  Hot flashes more than her usual 2.  Resumption of menstrual bleeding but it is  Going on for more than a week : I encouraged her to see her gynecologist 3.  Memory issues   Return to clinic in 6 months for follow-up    No orders of the defined types were placed in this encounter.   The patient has a good understanding of the overall plan. she agrees with it. she will call with any problems that may develop before the next visit here.   Rulon Eisenmenger, MD 08/16/2015

## 2015-08-16 NOTE — Assessment & Plan Note (Signed)
Rt Lumpectomy 01/16/15: 1.2 cm IDC 1/1 LN Positive eith ECE, Grade 3,Er 100%, PR 15%, Her 2 neg: 1.13 T1cN1M0 Stage 2A  (Pre-Op:Right breast invasive ductal carcinoma 2 cm by ultrasound one axillary lymph node biopsy positive, ER positive, PR positive, HER-2 equivocal [employer] HER 2 Negative, grade 3, Ki-67 40%, T2 N1 M0 stage IIB clinical stage) S/P AC X 4 foll by Taxol X 12  Reexcision and axillary lymph node dissection 02/08/2015 : Benign breast tissue; right axillary lymph node dissection: 1/11 lymph nodes positive for micrometastatic disease (tumor deposit 1.5 mm)  Chemotherapy-induced neuropathy: improving over time. Especially at night times in the evening she develops achiness and discomfort  PREVENT clinical trial: Atorvastatin versus placebo: Patient is experiencing myalgias grade 2. Current Treatment: Adjuvant antiestrogen therapy with tamoxifen 20 mg daily 10 years  Started 06/22/2015 Tamoxifen toxicities:   Return to clinic in 6 months for follow-up

## 2015-08-16 NOTE — Addendum Note (Signed)
Addended by: Prentiss Bells on: 08/16/2015 10:57 AM   Modules accepted: Orders, Medications

## 2015-08-16 NOTE — Progress Notes (Signed)
The Survivorship Care Plan was given to Barbara Myers at her visit with Dr. Lindi Adie today along with a brief overview of its contents and purpose. I encouraged her to review it in more detail and reach out to me with any questions or concerns.  I gave her a copy of my business card along with the care plan and brochures for various resources.  A copy of the care plan was also routed/faxed/mailed to No PCP Per Patient, the patient's PCP.  I will not be placing any follow-up appointments to the Survivorship Clinic for Barbara Myers, but I am happy to see her at any time in the future for any survivorship concerns that may arise. Thank you for allowing me to participate in her care!  Kenn File, [place] [phone]

## 2015-08-16 NOTE — Progress Notes (Signed)
08/16/2015 0920  12 month visit The patient comes to the clinic today alone.  She apologized several times for missing previous appointments.  "The holiday season was rough.  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  She was seen by the Survivorship NP.  She discussed her upcoming appointments for a cardiac MRI and follow-up with the cardiologist.  She verbalized understanding for these appointments and confirmed the correct dates and times for them. The patient reports feeling better than a few months ago.  She still complains of grade 1 numbness in hands and feet.  She denies pain in her thighs and large muscles, however, she stated that she "aches" from time to time.   The patient was seen and examined by Dr. Lindi Adie.  Based on PE, the patient was cleared by Dr. Lindi Adie to continue treatment with study drug.  Per Dr. Lindi Adie, "Patient is experiencing myalgias grade 1,which could be related to prior chemotherapy-induced neuropathy versus the study drug"  Okay to continue treatment with study drug per MD. The patient  forgot her old bottle of study pills and calendars.   The patient stated she had 2 pills left in the old bottle and admitted missing a few doses due to "crazy schedules and life".   She promised she would bring the old bottle along with her medication calendars back to the cancer center tomorrow.  This plan was okay with the study coordinator. Patient was given a new bottle of study drug after it was checked and verified by the pharmacist.  She was given medication diaries to last through June 2017.  The patient understands to call for any problems or concerns. Marcellus Scott, RN Clinical Research  08/17/2015 [phone] The patient brought her old bottle of study pills and medication diaries (for Sept.-Dec. 2016) to the cancer center this morning.  This RN thanked the patient for her commitment to this trial. The bottle contained 4 pills and this was confirmed by  pharmacist as the old bottle was returned and given to [facility]. Marcellus Scott, RN

## 2015-08-16 NOTE — Telephone Encounter (Signed)
Appointments made and avs printed for patient which was shredded per her request

## 2015-08-23 ENCOUNTER — Other Ambulatory Visit (HOSPITAL_COMMUNITY): Payer: Medicaid Other

## 2015-08-24 ENCOUNTER — Ambulatory Visit (HOSPITAL_COMMUNITY)
Admission: RE | Admit: 2015-08-24 | Discharge: 2015-08-24 | Disposition: A | Discharge status: Home / Self Care | Payer: Medicaid Other | Source: Ambulatory Visit | Attending: Cardiology | Admitting: Cardiology

## 2015-08-24 DIAGNOSIS — I429 Cardiomyopathy, unspecified: Secondary | ICD-10-CM | POA: Insufficient documentation

## 2015-08-24 DIAGNOSIS — I5022 Chronic systolic (congestive) heart failure: Secondary | ICD-10-CM | POA: Diagnosis not present

## 2015-08-24 LAB — CREATININE, SERUM
Creatinine, Ser: 0.79 mg/dL (ref 0.44–1.00)
GFR calc non Af Amer: >60 mL/min (ref >60)

## 2015-08-24 MED ORDER — GADOBENATE DIMEGLUMINE 529 MG/ML IV SOLN
30.0000 mL | Freq: Once | INTRAVENOUS | Status: AC | PRN
  Administered 2015-08-24: 30 mL via INTRAVENOUS

## 2015-08-30 ENCOUNTER — Ambulatory Visit (HOSPITAL_COMMUNITY)
Admission: RE | Admit: 2015-08-30 | Discharge: 2015-08-30 | Disposition: A | Discharge status: Home / Self Care | Payer: Medicaid Other | Source: Ambulatory Visit | Attending: Cardiology | Admitting: Cardiology

## 2015-08-30 ENCOUNTER — Encounter (HOSPITAL_COMMUNITY): Payer: Self-pay

## 2015-08-30 VITALS — BP 142/84 | HR 70 | Wt 197.2 lb

## 2015-08-30 DIAGNOSIS — I10 Essential (primary) hypertension: Secondary | ICD-10-CM | POA: Insufficient documentation

## 2015-08-30 DIAGNOSIS — T451X5A Adverse effect of antineoplastic and immunosuppressive drugs, initial encounter: Secondary | ICD-10-CM | POA: Diagnosis not present

## 2015-08-30 DIAGNOSIS — I427 Cardiomyopathy due to drug and external agent: Secondary | ICD-10-CM

## 2015-08-30 DIAGNOSIS — I429 Cardiomyopathy, unspecified: Secondary | ICD-10-CM | POA: Insufficient documentation

## 2015-08-30 MED ORDER — CARVEDILOL 12.5 MG PO TABS: 12.5000 mg | ORAL_TABLET | Freq: Two times a day (BID) | ORAL | Status: DC

## 2015-08-30 NOTE — Patient Instructions (Signed)
Stop Metoprolol   Start Carvedilol 12.5 mg Twice daily   Your physician has requested that you regularly monitor and record your blood pressure readings at home. Please use the same machine at the same time of day to check your readings and record them.  PLEASE CALL us ABOUT 2 WEEKS TO REPORT THE READINGS TO [place].  We will contact you in 6 months to schedule your next appointment.

## 2015-08-31 DIAGNOSIS — T451X5A Adverse effect of antineoplastic and immunosuppressive drugs, initial encounter: Secondary | ICD-10-CM

## 2015-08-31 DIAGNOSIS — I427 Cardiomyopathy due to drug and external agent: Secondary | ICD-10-CM | POA: Insufficient documentation

## 2015-08-31 NOTE — Progress Notes (Signed)
Patient ID: Barbara Myers, female   DOB: 04/14/1976, 40 y.o.   MRN: [id number] PCP: Dr. Lindi Adie  40 yo with history of HTN and breast cancer presents for cardiology evaluation after cardiac MRI done as part of a trial showed enlargement of the main PA suggestive of pulmonary hypertension. She has breast cancer and received chemotherapy involving Adriamycin initially, followed by lumpectomy and nodal biopsy.  She has been enrolled in the PREVENT trial (atorvastatin versus placebo).  As part of this trial, she had a cardiac MRI.  This showed mild PA dilation to 3 cm concerning for possible pulmonary hypertension.  Last echo in 12/15 showed no evidence for pulmonary hypertension, EF was normal.  Repeat echo in 10/16 showed EF down to 45-50%, peak RV-RA gradient 17 mmHg.  I set her up for a cardiac MRI to reassess LV function in 1/17.  This showed EF 58%, no late gadolinium enhancement.   Patient has been doing well generally.  No exertional dyspnea.  No chest pain.  No orthopnea/PND.  No lightheadedness.  She does not screen positive for OSA.  No history of smoking or lung disease.  BP is high today. Occasional palpitations when lying in bed at night.   Labs (7/16): K 4.2, creatinine 0.87 Labs (9/16): K 3.8, creatinine 0.8  PMH: 1. HTN 2. Breast cancer: Diagnosed 12/15.  ER+/PR+/HER2-.  12/15-5/16 had neoadjuvant chemo with Cytoxan, Adriamycin, and Taxol.  Lumpectomy/sentinel node biopsy 6/16.  Margins were focally positive and 1/3 LNs positive.  She then had axillary node dissection and excision of margin.  She has completed radiation.  - Echo (12/15) with EF 55-60%, GLS -24%.  - Echo (10/16) with EF 45-50%, normal RV size and systolic function, peak RV-RA gradient 17 mmHg.  - Cardiac MRI (1/17): LV EF 58%, RV EF 47%, no late gadolinium enhancement.   SH: Lives in [place], nonsmoker.  Works in [employer], research (medical).  FH: Grandmother with breast cancer.  No heart disease.  Mother and sister with hypertension,  mother had CVA.   ROS: All systems reviewed and negative except as per HPI.    Current Outpatient Prescriptions  Medication Sig Dispense Refill  . amLODipine (NORVASC) 5 MG tablet Take 1 tablet (5 mg total) by mouth daily. 30 tablet 3  . Atorvastatin Calcium (INVESTIGATIONAL ATORVASTATIN/PLACEBO) 40 MG tablet [facility] [id number] Take 1 tablet by mouth daily. Take 1 tablet daily with or without food. 180 tablet 0  . tamoxifen (NOLVADEX) 20 MG tablet Take 1 tablet (20 mg total) by mouth daily. 90 tablet 3  . carvedilol (COREG) 12.5 MG tablet Take 1 tablet (12.5 mg total) by mouth 2 (two) times daily. 60 tablet 6   No current facility-administered medications for this encounter.   BP 142/84 mmHg  Pulse 70  Wt 197 lb 4 oz (89.472 kg)  SpO2 99% General: NAD Neck: No JVD, no thyromegaly or thyroid nodule.  Lungs: Clear to auscultation bilaterally with normal respiratory effort. CV: Nondisplaced PMI.  Heart regular S1/S2, no S3/S4, no murmur.  No peripheral edema.  No carotid bruit.  Normal pedal pulses.  Abdomen: Soft, nontender, no hepatosplenomegaly, no distention.  Skin: Intact without lesions or rashes.  Neurologic: Alert and oriented x 3.  Psych: Normal affect. Extremities: No clubbing or cyanosis.  HEENT: Normal.   Assessment/Plan:  1. Cardiomyopathy: History of Adriamycin use.  Echo in 10/16 showed EF down to 45-50%.  Cardiac MRI in 1/17 showed normal LV and RV systolic function.  No evidence for pulmonary  hypertension, peak RV-RA gradient only 17 on last echo.  2. HTN: BP is running quite high, stop metoprolol and start Coreg 12.5 mg bid.  She will check BP daily and will call the office with readings in 2 wks.   Loralie Champagne  08/31/2015

## 2015-09-03 ENCOUNTER — Other Ambulatory Visit (HOSPITAL_COMMUNITY): Payer: Self-pay | Admitting: Cardiology

## 2015-09-12 ENCOUNTER — Telehealth: Payer: Self-pay | Admitting: Hematology and Oncology

## 2015-09-12 NOTE — Telephone Encounter (Signed)
Called and left a message with new appointment per gina dixon

## 2015-11-13 ENCOUNTER — Ambulatory Visit: Payer: Medicaid Other | Admitting: Physical Therapy

## 2015-12-13 ENCOUNTER — Ambulatory Visit: Payer: Medicaid Other | Admitting: Physical Therapy

## 2016-02-07 ENCOUNTER — Encounter: Payer: Self-pay | Admitting: *Deleted

## 2016-02-08 ENCOUNTER — Other Ambulatory Visit: Payer: Self-pay | Admitting: *Deleted

## 2016-02-08 ENCOUNTER — Encounter: Payer: Self-pay | Admitting: Hematology and Oncology

## 2016-02-08 ENCOUNTER — Telehealth: Payer: Self-pay | Admitting: Hematology and Oncology

## 2016-02-08 ENCOUNTER — Ambulatory Visit (HOSPITAL_BASED_OUTPATIENT_CLINIC_OR_DEPARTMENT_OTHER): Payer: Medicaid Other | Admitting: Hematology and Oncology

## 2016-02-08 ENCOUNTER — Encounter: Payer: Medicaid Other | Admitting: *Deleted

## 2016-02-08 VITALS — BP 153/97 | HR 66 | Temp 98.4°F | Resp 18 | Wt 210.4 lb

## 2016-02-08 DIAGNOSIS — R600 Localized edema: Secondary | ICD-10-CM

## 2016-02-08 DIAGNOSIS — N920 Excessive and frequent menstruation with regular cycle: Secondary | ICD-10-CM

## 2016-02-08 DIAGNOSIS — Z170 Estrogen receptor positive status [ER+]: Secondary | ICD-10-CM | POA: Diagnosis not present

## 2016-02-08 DIAGNOSIS — M654 Radial styloid tenosynovitis [de Quervain]: Secondary | ICD-10-CM

## 2016-02-08 DIAGNOSIS — I10 Essential (primary) hypertension: Secondary | ICD-10-CM | POA: Diagnosis not present

## 2016-02-08 DIAGNOSIS — C50411 Malignant neoplasm of upper-outer quadrant of right female breast: Secondary | ICD-10-CM

## 2016-02-08 DIAGNOSIS — Z79810 Long term (current) use of selective estrogen receptor modulators (SERMs): Secondary | ICD-10-CM

## 2016-02-08 MED ORDER — HYDROCHLOROTHIAZIDE 25 MG PO TABS: 25.0000 mg | ORAL_TABLET | Freq: Every day | ORAL | Status: DC

## 2016-02-08 MED ORDER — INV-ATORVASTATIN/PLACEBO 40 MG TABS WAKE FOREST WF 98213: 1.0000 | ORAL_TABLET | Freq: Every day | ORAL | Status: DC

## 2016-02-08 NOTE — Progress Notes (Signed)
Patient Care Team: No Pcp Per Patient as PCP - General (General Practice) Rolm Bookbinder, MD as Consulting Physician (General Surgery) Nicholas Lose, MD as Consulting Physician (Hematology and Oncology) Thea Silversmith, MD as Consulting Physician (Radiation Oncology) Holley Bouche, NP as Nurse Practitioner (Nurse Practitioner) Carola Frost, RN as Registered Nurse (Oncology) Sylvan Cheese, NP as Nurse Practitioner (Hematology and Oncology)  DIAGNOSIS: Breast cancer of upper-outer quadrant of right female breast [facility])   Staging form: Breast, AJCC 7th Edition     Clinical stage from 07/26/2014: Stage IIB (T2, N1, M0) - Unsigned       Staging comments: Staged at breast conference on 12.16.15      Pathologic: Stage IIA (T1c, N1a, cM0) - Unsigned   SUMMARY OF ONCOLOGIC HISTORY:   Breast cancer of upper-outer quadrant of right female breast ([place])   07/13/2014 Mammogram Right breast: indeterminate mass measuring 2 cm at 10:00 position   07/20/2014 Initial Biopsy Right breast bx: Invasive ductal carcinoma, grade 3, ER+ (100%), PR+ (80%), HER-2 equivocal ratio 1.55 (repeated and negative w/ ratio 1.20), Ki-67 46%. Rt axillary LN bx: metastatic carcinoma (1/1).   07/31/2014 Procedure Genetic testing revealed three variants of unknown significance - MRE11A c.256G>C, PMS2 c.1004A>G, and RAD51C c.200A>G - but was otherwise normal, and did not reveal a deleterious mutation in these genes   08/01/2014 Breast MRI Right breast: enhancement measuring 3.9 x 2.7 x 2.5 cm;  no other areas of enhancement in right breast.  None in left breast.  Right axillary LN measuring 7 mm corresponding with biopsied node.   08/01/2014 Clinical Stage Stage IIB: T2 N1   08/09/2014 - 12/19/2014 Neo-Adjuvant Chemotherapy Dose dense Adriamycin and Cytoxan 4 followed by Taxol 12   12/26/2014 Breast MRI Decreased size of right breast malignancy, now measuring up to 3.8 cm AP, previously measured up to 4.6 cm AP  when remeasured in a similar manner.  No abnormal appearing LN (including previously seen right axillary node)   01/16/2015 Definitive Surgery Right Lumpectomy/SLNB Donne Hazel): IDC, measuring 1.2 cm,  1/3 LN Positive with ECE, Grade 3, ER+ (100%), PR+ (15%), HER/2 neg: (ratio 1.13)    01/16/2015 Pathologic Stage Stage IIA (ypT1c ypN1 M0)   02/08/2015 Surgery Right breast post margin excision/ALD: Benign breast tissue; right axillary lymph node dissection: 1/11 lymph nodes positive for micrometastatic disease (tumor deposit 1.5 mm)   04/05/2015 - 05/22/2015 Radiation Therapy Adjuvant RT Pablo Ledger): Right breast 45 Gy over 25 fractions. Right supraclavicular fossa 45 Gy over 25 fractions. Right breast boost: 16 Gy over 8 fractions. Total dose: 61 Gy.   06/22/2015 -  Anti-estrogen oral therapy Tamoxifen 20 mg daily. Planned duration of therapy 10 years.   08/16/2015 Survivorship Survivorship care plan given to patient in lieu of in person visit.    CHIEF COMPLIANT:Follow-up on tamoxifen complaining of swelling  INTERVAL HISTORY: Barbara Myers is a 40 year old with above-mentioned history of right breast cancer currently on tamoxifen therapy. She had a lot of hot flashes which have improved. However she is complaining of swelling of her extremities with fluid retention. She has gained weight and is very frustrated about it. She is also complaining of tendinitis involving her left wrist area. She was seen at urgent care and was told that she has de Quervain's tenosynovitis. She has been working 2 jobs and works all day. She has been tolerating the clinical trial medication extremely well. She has not seen a gynecologist. She continues to have heavy menstrual cycles that are  also quite long.  REVIEW OF SYSTEMS:   Constitutional: Denies fevers, chills or abnormal weight loss Eyes: Denies blurriness of vision Ears, nose, mouth, throat, and face: Denies mucositis or sore throat Respiratory: Denies cough, dyspnea  or wheezes Cardiovascular: Denies palpitation, chest discomfort Gastrointestinal:  Denies nausea, heartburn or change in bowel habits Skin: Denies abnormal skin rashes Lymphatics: Denies new lymphadenopathy or easy bruising Neurological:Denies numbness, tingling or new weaknesses Behavioral/Psych: Mood is stable, no new changes  Extremities: 1+ edema along with tendinitis of the left wrist Breast:  denies any pain or lumps or nodules in either breasts All other systems were reviewed with the patient and are negative.  I have reviewed the past medical history, past surgical history, social history and family history with the patient and they are unchanged from previous note.  ALLERGIES:  is allergic to tape.  MEDICATIONS:  Current Outpatient Prescriptions  Medication Sig Dispense Refill  . amLODipine (NORVASC) 5 MG tablet TAKE 1 TABLET (5 MG TOTAL) BY MOUTH DAILY. 30 tablet 3  . Atorvastatin Calcium (INVESTIGATIONAL ATORVASTATIN/PLACEBO) 40 MG tablet [facility] [address] Take 1 tablet by mouth daily. Take 1 tablet daily with or without food. 180 tablet 0  . carvedilol (COREG) 12.5 MG tablet Take 1 tablet (12.5 mg total) by mouth 2 (two) times daily. 60 tablet 6  . tamoxifen (NOLVADEX) 20 MG tablet Take 1 tablet (20 mg total) by mouth daily. 90 tablet 3   No current facility-administered medications for this visit.    PHYSICAL EXAMINATION: ECOG PERFORMANCE STATUS: 1 - Symptomatic but completely ambulatory  Filed Vitals:   02/08/16 0903  BP: 153/97  Pulse: 66  Temp: 98.4 F (36.9 C)  Resp: 18   Filed Weights   02/08/16 0903  Weight: 210 lb 6.4 oz (95.437 kg)    GENERAL:alert, no distress and comfortable SKIN: skin color, texture, turgor are normal, no rashes or significant lesions EYES: normal, Conjunctiva are pink and non-injected, sclera clear OROPHARYNX:no exudate, no erythema and lips, buccal mucosa, and tongue normal  NECK: supple, thyroid normal size, non-tender,  without nodularity LYMPH:  no palpable lymphadenopathy in the cervical, axillary or inguinal LUNGS: clear to auscultation and percussion with normal breathing effort HEART: regular rate & rhythm and no murmurs and no lower extremity edema ABDOMEN:abdomen soft, non-tender and normal bowel sounds MUSCULOSKELETAL:no cyanosis of digits and no clubbing  NEURO: alert & oriented x 3 with fluent speech, no focal motor/sensory deficits EXTREMITIES: No lower extremity edema  LABORATORY DATA:  I have reviewed the data as listed   Chemistry      Component Value Date/Time   NA 141 05/11/2015 1352   NA 137 02/09/2015 0720   K 3.8 05/11/2015 1352   K 4.2 02/09/2015 0720   CL 105 02/09/2015 0720   CO2 29 05/11/2015 1352   CO2 26 02/09/2015 0720   BUN 8.3 05/11/2015 1352   BUN 11 02/09/2015 0720   CREATININE 0.79 08/24/2015 0740   CREATININE 0.8 05/11/2015 1352      Component Value Date/Time   CALCIUM 8.8 05/11/2015 1352   CALCIUM 8.8* 02/09/2015 0720   ALKPHOS 80 05/11/2015 1352   ALKPHOS 64 02/09/2015 0720   AST 20 05/11/2015 1352   AST 22 02/09/2015 0720   ALT 24 05/11/2015 1352   ALT 29 02/09/2015 0720   BILITOT 0.38 05/11/2015 1352   BILITOT 0.6 02/09/2015 0720       Lab Results  Component Value Date   WBC 3.9 05/11/2015  HGB 11.1* 05/11/2015   HCT 34.3* 05/11/2015   MCV 87.7 05/11/2015   PLT 203 05/11/2015   NEUTROABS 2.7 05/11/2015   ASSESSMENT & PLAN:  Breast cancer of upper-outer quadrant of right female breast ([place]) Rt Lumpectomy 01/16/15: 1.2 cm IDC 1/1 LN Positive eith ECE, Grade 3,Er 100%, PR 15%, Her 2 neg: 1.13 T1cN1M0 Stage 2A  (Pre-Op:Right breast invasive ductal carcinoma 2 cm by ultrasound one axillary lymph node biopsy positive, ER positive, PR positive, HER-2 equivocal [employer] HER 2 Negative, grade 3, Ki-67 40%, T2 N1 M0 stage IIB clinical stage) S/P AC X 4 foll by Taxol X 12  Reexcision and axillary lymph node dissection 02/08/2015 : Benign breast  tissue; right axillary lymph node dissection: 1/11 lymph nodes positive for micrometastatic disease (tumor deposit 1.5 mm)  Chemotherapy-induced neuropathy: improving over time.  PREVENT clinical trial: Atorvastatin versus placebo: No toxicities to the study drug  Hypertension: patient Has seen cardiology. She is currently on metoprolol and Norvasc.Patient is having increased swelling of her extremities which could be related to tamoxifen but it could also be related to Norvasc. I recommended discontinuation of Norvasc and starting her on hydrochlorothiazide. This should help with the fluid retention and she was well.  Current Treatment: Adjuvant antiestrogen therapy with tamoxifen 20 mg daily 10 years Started 06/22/2015 Tamoxifen toxicities: 1. Hot flashes have subsided 2. Resumption of menstrual bleeding but it is Going on for more than a week : I encouraged her to see her gynecologist 3. Barbara issues  De Quervains tenosynovitis: I encouraged her to use heat as well as capsaicin and use a wrist splint. If it does not get better, we might have to consult orthopedics for local injection therapy.  Return to clinic in 6 months for follow-up   No orders of the defined types were placed in this encounter.   The patient has a good understanding of the overall plan. she agrees with it. she will call with any problems that may develop before the next visit here.   Rulon Eisenmenger, MD 02/08/2016

## 2016-02-08 NOTE — Progress Notes (Signed)
02/08/2016 1015  [id number] PREVENT study 18 month visit The patient arrived to [facility] for MD and research nurse visit. The patient stated she was doing well except that she would like to lose some weight.  She reports working a full- time and part-time job.   She stated she is not having any side effects that she relates to study drug. She denies muscle aches/pains, headache, resp. symptoms, abdominal pain, etc.  She said she has had no difficulty completing the medication diaries but that she forgot to bring in her old bottle of study drug and forgot to bring the calendars in.  Discussed with the patient that we have not been able to reach her by phone.  The patient stated that she has no service where she works and that she works a 12 hour day.  We discussed communication through My Chart and she said that it is the preferred communication for her right now. She completed the 18 month questionnaires without difficulty. The patient was seen by Dr. Lindi Adie. Per Dr. Lindi Adie, she can continue taking the study drug per protocol. Dr. Lindi Adie confirmed patient has no known side effects to study drug at present. The patient reported only missing a few of her study drug pills.  She likes to take it at night.  She apologized for forgetting her study drug and calendars and asked if she could return them on 02/11/16.  This RN checked with the study coordinator, Marlon Pel,  who agreed would be okay.  Plans made for patient to return drug and calendars to [facility] front desk on 02/11/16 early morning. Last bottle of study drug was checked by pharmacist Oretha Caprice,  bottle #4 (kit # [id number]) with 180 pills in order to supply patient with enough study medication to get through 08/07/16, which is the full 2 year mark from study drug start date. The patient was provided with medication diaries for July 2017 through Dec. 2017. She was given return self-addressed stamped envelopes. She was instructed to bring back study pill  bottles and not to throw away. The patient verbalized comprehension of instructions.This RN thanked the patient for her continued participation on the study.  Marcellus Scott, RN, BSN, MHA, OCN

## 2016-02-08 NOTE — Assessment & Plan Note (Signed)
Rt Lumpectomy 01/16/15: 1.2 cm IDC 1/1 LN Positive eith ECE, Grade 3,Er 100%, PR 15%, Her 2 neg: 1.13 T1cN1M0 Stage 2A  (Pre-Op:Right breast invasive ductal carcinoma 2 cm by ultrasound one axillary lymph node biopsy positive, ER positive, PR positive, HER-2 equivocal [employer] HER 2 Negative, grade 3, Ki-67 40%, T2 N1 M0 stage IIB clinical stage) S/P AC X 4 foll by Taxol X 12  Reexcision and axillary lymph node dissection 02/08/2015 : Benign breast tissue; right axillary lymph node dissection: 1/11 lymph nodes positive for micrometastatic disease (tumor deposit 1.5 mm)  Chemotherapy-induced neuropathy: improving over time.  PREVENT clinical trial: Atorvastatin versus placebo: Patient is experiencing myalgias grade 1, Which could be related to prior chemotherapy-induced neuropathy versus the study drug. She is scheduled to undergo cardiac MRI soon.  Hypertension: patient is currently seeing cardiology. She is currently on metoprolol and Norvasc. Her blood pressure still continues to remain high. She has occasional palpitations as well especially when she sleeps.  Current Treatment: Adjuvant antiestrogen therapy with tamoxifen 20 mg daily 10 years Started 06/22/2015 Tamoxifen toxicities: 1. Hot flashes more than her usual 2. Resumption of menstrual bleeding but it is Going on for more than a week : I encouraged her to see her gynecologist 3. Memory issues  Return to clinic in 6 months for follow-up

## 2016-02-08 NOTE — Telephone Encounter (Signed)
appt made and avs printed °

## 2016-02-13 ENCOUNTER — Encounter: Payer: Self-pay | Admitting: *Deleted

## 2016-02-15 ENCOUNTER — Encounter: Payer: Self-pay | Admitting: *Deleted

## 2016-02-15 NOTE — Progress Notes (Signed)
02/15/2016 0940 This RN left voice messages on 02/14/16 and 02/15/16 regarding need for patient to return medication bottle #3 and medications calendars from Jan. 2017 through June 2017.  Email sent through MyChart re: above on 02/14/16. Marcellus Scott, RN, BSN, MHA, OCN

## 2016-02-22 ENCOUNTER — Ambulatory Visit: Payer: Medicaid Other | Admitting: Hematology and Oncology

## 2016-02-22 ENCOUNTER — Telehealth: Payer: Self-pay | Admitting: *Deleted

## 2016-02-22 NOTE — Telephone Encounter (Signed)
02/22/2016 1645 PREVENT The patient stated she had forgotten bring her medication calendars and study drug bottle #3  back to [facility].  She asked if her mother could bring them to [facility] and this RN said that would be fine.  Plans made for patient's mother to bring back items (empty SD and med. Calendars) next week.  This RN thanked the patient for her committment, to the study. Marcellus Scott, RN, BSN, MHA, OCN

## 2016-02-25 ENCOUNTER — Encounter: Payer: Self-pay | Admitting: *Deleted

## 2016-02-25 NOTE — Progress Notes (Signed)
02/25/2016 0830 PREVENT return of study drug The  medication calendars for Jan. through June 2017, and the study drug bottle #3,  were returned to front desk attendant in a sealed envelope.  This RN was contacted immediately to pick up. There were 13 pills left in the bottle.  The study drug bottle #3 was taken to pharmacy and given to [facility] [place]. D, who counted 13 pills remaining in the bottle. The patient's medication calendars for Jan. through June 2017 had been completed by the patient.  Upon review of the medication calendars, 13 doses were not recorded as taken.  The patient stated by telephone on 02/22/16 that she stopped taking from #3 bottle on 02/08/16 and started #4 on 02/09/16.  The total # of tablets originally dispensed (180) minus the total # of tablets returned (13) divided by the  # days in visit interval (176) = a 94% compliance rate per research records.

## 2016-03-17 ENCOUNTER — Encounter (HOSPITAL_COMMUNITY): Payer: Self-pay | Admitting: *Deleted

## 2016-05-11 ENCOUNTER — Encounter: Payer: Self-pay | Admitting: Hematology and Oncology

## 2016-06-03 ENCOUNTER — Encounter: Payer: Self-pay | Admitting: *Deleted

## 2016-06-03 NOTE — Progress Notes (Signed)
06/03/2016 1150 PREVENT study Left voice message with patient requesting a call back.  Need to schedule cardiac MRI for December and need to collect medication diaries per study requirement.  Contact name and number left in the message. Marcellus Scott, RN, BSN, MHA, OCN

## 2016-06-09 ENCOUNTER — Other Ambulatory Visit: Payer: Self-pay | Admitting: Hematology and Oncology

## 2016-06-09 DIAGNOSIS — C50411 Malignant neoplasm of upper-outer quadrant of right female breast: Secondary | ICD-10-CM

## 2016-07-07 ENCOUNTER — Telehealth: Payer: Self-pay | Admitting: *Deleted

## 2016-07-07 NOTE — Telephone Encounter (Signed)
07/07/2016 1550 PREVENT study Left voice message with patient requesting a call back to this RN to discuss December 28th research appointment.   Would like to discuss end of study visit.  Contact name and number for this RN left in the message. Marcellus Scott, RN, BSN, MHA, OCN

## 2016-07-08 ENCOUNTER — Other Ambulatory Visit: Payer: Self-pay | Admitting: *Deleted

## 2016-07-08 DIAGNOSIS — C50411 Malignant neoplasm of upper-outer quadrant of right female breast: Secondary | ICD-10-CM

## 2016-07-08 DIAGNOSIS — Z170 Estrogen receptor positive status [ER+]: Principal | ICD-10-CM

## 2016-07-11 ENCOUNTER — Encounter (HOSPITAL_COMMUNITY): Payer: Self-pay | Admitting: *Deleted

## 2016-07-13 ENCOUNTER — Telehealth: Payer: Self-pay | Admitting: Hematology and Oncology

## 2016-07-13 NOTE — Telephone Encounter (Signed)
Lvm advising appt change from 12/28 to 08/14/16 @ 2.45pm due to md pal.

## 2016-07-14 ENCOUNTER — Other Ambulatory Visit: Payer: Self-pay | Admitting: *Deleted

## 2016-07-14 ENCOUNTER — Telehealth: Payer: Self-pay | Admitting: *Deleted

## 2016-07-14 DIAGNOSIS — Z170 Estrogen receptor positive status [ER+]: Principal | ICD-10-CM

## 2016-07-14 DIAGNOSIS — C50411 Malignant neoplasm of upper-outer quadrant of right female breast: Secondary | ICD-10-CM

## 2016-07-14 NOTE — Telephone Encounter (Signed)
07/14/2016 [id number] PREVENT study Left voice message with patient regarding the Jan. 4. 2018 appointments with MD, lab, and research.  Left message requesting the patient bring in study drug bottle and medication diaries.  Contact name and number for this RN was left in the message. Marcellus Scott, RN, BSN, MHA, OCN

## 2016-07-22 ENCOUNTER — Telehealth: Payer: Self-pay | Admitting: *Deleted

## 2016-07-22 NOTE — Telephone Encounter (Signed)
07/22/2016 0935 Left voice message requesting patient call this RN back regarding upcoming appointment and need to schedule the cardiac MRI.  This RN's contact information was left in the voice message. Marcellus Scott, RN, BSN, MHA, OCN

## 2016-07-25 ENCOUNTER — Other Ambulatory Visit: Payer: Self-pay | Admitting: Hematology and Oncology

## 2016-07-25 ENCOUNTER — Telehealth: Payer: Self-pay | Admitting: *Deleted

## 2016-07-25 DIAGNOSIS — C50911 Malignant neoplasm of unspecified site of right female breast: Secondary | ICD-10-CM

## 2016-07-25 NOTE — Telephone Encounter (Signed)
07/25/2016 0915 PREVENT study Patient left voice message confirming lab, MD, and research appointments on 08/14/16.  She stated it would be fine with her to schedule the cardiac MRI for 08/15/16.

## 2016-08-07 ENCOUNTER — Other Ambulatory Visit: Payer: PRIVATE HEALTH INSURANCE | Admitting: Hematology and Oncology

## 2016-08-07 ENCOUNTER — Ambulatory Visit: Payer: PRIVATE HEALTH INSURANCE | Admitting: Hematology and Oncology

## 2016-08-07 ENCOUNTER — Encounter: Payer: PRIVATE HEALTH INSURANCE | Admitting: *Deleted

## 2016-08-13 NOTE — Assessment & Plan Note (Signed)
Rt Lumpectomy 01/16/15: 1.2 cm IDC 1/1 LN Positive eith ECE, Grade 3,Er 100%, PR 15%, Her 2 neg: 1.13 T1cN1M0 Stage 2A  (Pre-Op:Right breast invasive ductal carcinoma 2 cm by ultrasound one axillary lymph node biopsy positive, ER positive, PR positive, HER-2 equivocal [employer] HER 2 Negative, grade 3, Ki-67 40%, T2 N1 M0 stage IIB clinical stage) S/P AC X 4 foll by Taxol X 12  Reexcision and axillary lymph node dissection 02/08/2015 : Benign breast tissue; right axillary lymph node dissection: 1/11 lymph nodes positive for micrometastatic disease (tumor deposit 1.5 mm)  Chemotherapy-induced neuropathy: improving over time.  PREVENT clinical trial: Atorvastatin versus placebo: Patient is experiencing myalgias grade 1, Which could be related to prior chemotherapy-induced neuropathy versus the study drug. She is scheduled to undergo cardiac MRI soon.  Hypertension: patient is currently seeing cardiology. She is currently on metoprolol and Norvasc. Her blood pressure still continues to remain high. She has occasional palpitations as well especially when she sleeps.  Current Treatment: Adjuvant antiestrogen therapy with tamoxifen 20 mg daily 10 years Started 06/22/2015  Tamoxifen toxicities: 1. Hot flashes more than her usual 2. Resumption of menstrual bleeding but it is Going on for more than a week : I encouraged her to see her gynecologist 3. Memory issues  Return to clinic in 1 yr for follow-up

## 2016-08-14 ENCOUNTER — Other Ambulatory Visit: Payer: Self-pay | Admitting: Emergency Medicine

## 2016-08-14 ENCOUNTER — Encounter: Payer: Medicaid Other | Admitting: *Deleted

## 2016-08-14 ENCOUNTER — Ambulatory Visit: Payer: PRIVATE HEALTH INSURANCE | Admitting: Hematology and Oncology

## 2016-08-14 ENCOUNTER — Encounter: Payer: Self-pay | Admitting: Hematology and Oncology

## 2016-08-14 ENCOUNTER — Other Ambulatory Visit (HOSPITAL_BASED_OUTPATIENT_CLINIC_OR_DEPARTMENT_OTHER): Payer: Medicaid Other

## 2016-08-14 DIAGNOSIS — C50411 Malignant neoplasm of upper-outer quadrant of right female breast: Secondary | ICD-10-CM | POA: Diagnosis not present

## 2016-08-14 DIAGNOSIS — C773 Secondary and unspecified malignant neoplasm of axilla and upper limb lymph nodes: Secondary | ICD-10-CM

## 2016-08-14 DIAGNOSIS — Z170 Estrogen receptor positive status [ER+]: Principal | ICD-10-CM

## 2016-08-14 DIAGNOSIS — N951 Menopausal and female climacteric states: Secondary | ICD-10-CM

## 2016-08-14 DIAGNOSIS — I10 Essential (primary) hypertension: Secondary | ICD-10-CM | POA: Diagnosis not present

## 2016-08-14 DIAGNOSIS — G620 Drug-induced polyneuropathy: Secondary | ICD-10-CM

## 2016-08-14 DIAGNOSIS — R609 Edema, unspecified: Secondary | ICD-10-CM

## 2016-08-14 LAB — RESEARCH LABS

## 2016-08-14 NOTE — Progress Notes (Addendum)
08/14/2016 1615 24 month PREVENT visit The patient arrived at the clinic alone.  Her research labs were obtained.  She had vital signs and weight measured.   The 24 month nurse assessment was done.  The patient's waist measured 39.3 cm. This was checked twice.  The current medications were reviewed. The RN assessment also included the follow-up statin questionnaire. She denied taking any new medications not on her current med list. She denied taking any othercholesterol lowering meds and said she had not received any heart protective meds.  The patient stated she forgot to bring her calendars and her pill bottle back.  She said she missed quite a few pills due to leaving the study drug bottle at her mother's house (mother lives out of town).  She said this occurred in the month of October 2017.  She said she also missed  pills during the 6 month time frame due to" life stressors".  She said she took her last pill on 08/13/16. She was seen by Dr. Lindi Adie and he performed a physical exam.  The patient stated she has swelling of her extremities with fluid retention.  Dr. Lindi Adie stated it was related to Tamoxifen.  The patient complained of grade 1 myalgias.  MD stated it could be related to prior chemotherapy-induced neuropathy versus the study drug.  The patient denied other potential side effects to study drug.  The patient was asked if she could be pregnant and she stated "no".  The patient stated she started her period today and had passed a clot.  MD encouraged patient to see GYN due to patient's history of heavy periods and clots in the past.  Patient verbalized understanding.   Dr. Lindi Adie cleared the patient for scheduled cardiac MRI on 08/15/16.  The patient denied having any metal in her body or claustrophobia. The patient had the 24 month neurocognitive exam performed after MD visit.  The patient verbalized understanding that this is the completion of the PREVENT study except that she will receive a 25 month  follow-up call in 30 days from Doristine Johns, Therapist, sports. Plans made to meet patient on 08/15/16 prior to MRI to obtain pill bottle and calendars. Marcellus Scott, RN, BSN, MHA, OCN  08/15/2016 [phone] PREVENT The patient brought the study drug and medication calendars to MRI appointment.  This RN obtained them from patient and thanked her for her time and commitment to the PREVENT study.  The study drug bottle #4 (kit [id number]) was taken to pharmacist, Romualdo Bolk.  Return pill count confirmed per pharmacist to equal 56 pills. Marcellus Scott, RN, BSN, MHA, OCN

## 2016-08-14 NOTE — Progress Notes (Signed)
Patient Care Team: No Pcp Per Patient as PCP - General (General Practice) Rolm Bookbinder, MD as Consulting Physician (General Surgery) Nicholas Lose, MD as Consulting Physician (Hematology and Oncology) Thea Silversmith, MD as Consulting Physician (Radiation Oncology) Holley Bouche, NP as Nurse Practitioner (Nurse Practitioner) Carola Frost, RN as Registered Nurse (Oncology) Sylvan Cheese, NP as Nurse Practitioner (Hematology and Oncology)  DIAGNOSIS:  Encounter Diagnosis  Name Primary?  . Malignant neoplasm of upper-outer quadrant of right breast in female, estrogen receptor positive ([place])     SUMMARY OF ONCOLOGIC HISTORY:   Breast cancer of upper-outer quadrant of right female breast ([place])   07/13/2014 Mammogram    Right breast: indeterminate mass measuring 2 cm at 10:00 position      07/20/2014 Initial Biopsy    Right breast bx: Invasive ductal carcinoma, grade 3, ER+ (100%), PR+ (80%), HER-2 equivocal ratio 1.55 (repeated and negative w/ ratio 1.20), Ki-67 46%. Rt axillary LN bx: metastatic carcinoma (1/1).      07/31/2014 Procedure    Genetic testing revealed three variants of unknown significance - MRE11A c.256G>C, PMS2 c.1004A>G, and RAD51C c.200A>G - but was otherwise normal, and did not reveal a deleterious mutation in these genes      08/01/2014 Breast MRI    Right breast: enhancement measuring 3.9 x 2.7 x 2.5 cm;  no other areas of enhancement in right breast.  None in left breast.  Right axillary LN measuring 7 mm corresponding with biopsied node.      08/01/2014 Clinical Stage    Stage IIB: T2 N1      08/09/2014 - 12/19/2014 Neo-Adjuvant Chemotherapy    Dose dense Adriamycin and Cytoxan 4 followed by Taxol 12      12/26/2014 Breast MRI    Decreased size of right breast malignancy, now measuring up to 3.8 cm AP, previously measured up to 4.6 cm AP when remeasured in a similar manner.  No abnormal appearing LN (including previously seen right  axillary node)      01/16/2015 Definitive Surgery    Right Lumpectomy/[facility] Donne Hazel): IDC, measuring 1.2 cm,  1/3 LN Positive with ECE, Grade 3, ER+ (100%), PR+ (15%), HER/2 neg: (ratio 1.13)       01/16/2015 Pathologic Stage    Stage IIA (ypT1c ypN1 M0)      02/08/2015 Surgery    Right breast post margin excision/ALD: Benign breast tissue; right axillary lymph node dissection: 1/11 lymph nodes positive for micrometastatic disease (tumor deposit 1.5 mm)      04/05/2015 - 05/22/2015 Radiation Therapy    Adjuvant RT Pablo Ledger): Right breast 45 Gy over 25 fractions. Right supraclavicular fossa 45 Gy over 25 fractions. Right breast boost: 16 Gy over 8 fractions. Total dose: 61 Gy.      06/22/2015 -  Anti-estrogen oral therapy    Tamoxifen 20 mg daily. Planned duration of therapy 10 years.      08/16/2015 Survivorship    Survivorship care plan given to patient in lieu of in person visit.       CHIEF COMPLIANT: Follow-up on tamoxifen therapy  INTERVAL HISTORY: Barbara Myers is a 41 year old with above-mentioned history of right breast cancer currently on tamoxifen therapy. She had a lot of hot flashes which have improved she started taking tamoxifen at evening. However she is complaining of swelling of her extremities with fluid retention. She did intermittent fasting diet and lost 11 pounds but had regained it back because of emotional eating.. She has been working 2 jobs  and works all day. She has been tolerating the clinical trial medication extremely well.  REVIEW OF SYSTEMS:   Constitutional: Denies fevers, chills or abnormal weight loss Eyes: Denies blurriness of vision Ears, nose, mouth, throat, and face: Denies mucositis or sore throat Respiratory: Denies cough, dyspnea or wheezes Cardiovascular: Denies palpitation, chest discomfort Gastrointestinal:  Denies nausea, heartburn or change in bowel habits Skin: Denies abnormal skin rashes Lymphatics: Denies new lymphadenopathy or  easy bruising Neurological:Denies numbness, tingling or new weaknesses Behavioral/Psych: Mood is stable, no new changes  Extremities: No lower extremity edema Breast:  denies any pain or lumps or nodules in either breasts All other systems were reviewed with the patient and are negative.  I have reviewed the past medical history, past surgical history, social history and family history with the patient and they are unchanged from previous note.  ALLERGIES:  is allergic to tape.  MEDICATIONS:  Current Outpatient Prescriptions  Medication Sig Dispense Refill  . Atorvastatin Calcium (INVESTIGATIONAL ATORVASTATIN/PLACEBO) 40 MG tablet [facility] 33545 Take 1 tablet by mouth daily. Take 1 tablet daily with or without food. 180 tablet 0  . carvedilol (COREG) 12.5 MG tablet Take 1 tablet (12.5 mg total) by mouth 2 (two) times daily. 60 tablet 6  . hydrochlorothiazide (HYDRODIURIL) 25 MG tablet Take 1 tablet (25 mg total) by mouth daily. 30 tablet 11  . tamoxifen (NOLVADEX) 20 MG tablet TAKE 1 TABLET BY MOUTH EVERY DAY 90 tablet 3   No current facility-administered medications for this visit.     PHYSICAL EXAMINATION: ECOG PERFORMANCE STATUS: 1 - Symptomatic but completely ambulatory  Vitals:   08/14/16 1527  BP: (!) 142/92  Pulse: 76  Resp: 18  Temp: 97.9 F (36.6 C)   Filed Weights   08/14/16 1527  Weight: 214 lb 14.4 oz (97.5 kg)    GENERAL:alert, no distress and comfortable SKIN: skin color, texture, turgor are normal, no rashes or significant lesions EYES: normal, Conjunctiva are pink and non-injected, sclera clear OROPHARYNX:no exudate, no erythema and lips, buccal mucosa, and tongue normal  NECK: supple, thyroid normal size, non-tender, without nodularity LYMPH:  no palpable lymphadenopathy in the cervical, axillary or inguinal LUNGS: clear to auscultation and percussion with normal breathing effort HEART: regular rate & rhythm and no murmurs and no lower extremity  edema ABDOMEN:abdomen soft, non-tender and normal bowel sounds MUSCULOSKELETAL:no cyanosis of digits and no clubbing  NEURO: alert & oriented x 3 with fluent speech, no focal motor/sensory deficits EXTREMITIES: No lower extremity edema BREAST: No palpable masses or nodules in either right or left breasts. No palpable axillary supraclavicular or infraclavicular adenopathy no breast tenderness or nipple discharge. (exam performed in the presence of a chaperone)  LABORATORY DATA:  I have reviewed the data as listed   Chemistry      Component Value Date/Time   NA 141 05/11/2015 1352   K 3.8 05/11/2015 1352   CL 105 02/09/2015 0720   CO2 29 05/11/2015 1352   BUN 8.3 05/11/2015 1352   CREATININE 0.79 08/24/2015 0740   CREATININE 0.8 05/11/2015 1352      Component Value Date/Time   CALCIUM 8.8 05/11/2015 1352   ALKPHOS 80 05/11/2015 1352   AST 20 05/11/2015 1352   ALT 24 05/11/2015 1352   BILITOT 0.38 05/11/2015 1352       Lab Results  Component Value Date   WBC 3.9 05/11/2015   HGB 11.1 (L) 05/11/2015   HCT 34.3 (L) 05/11/2015   MCV 87.7 05/11/2015  PLT 203 05/11/2015   NEUTROABS 2.7 05/11/2015    ASSESSMENT & PLAN:  Breast cancer of upper-outer quadrant of right female breast ([place]) Rt Lumpectomy 01/16/15: 1.2 cm IDC 1/1 LN Positive eith ECE, Grade 3,Er 100%, PR 15%, Her 2 neg: 1.13 T1cN1M0 Stage 2A  (Pre-Op:Right breast invasive ductal carcinoma 2 cm by ultrasound one axillary lymph node biopsy positive, ER positive, PR positive, HER-2 equivocal [employer] HER 2 Negative, grade 3, Ki-67 40%, T2 N1 M0 stage IIB clinical stage) S/P AC X 4 foll by Taxol X 12  Reexcision and axillary lymph node dissection 02/08/2015 : Benign breast tissue; right axillary lymph node dissection: 1/11 lymph nodes positive for micrometastatic disease (tumor deposit 1.5 mm)  Chemotherapy-induced neuropathy: improving over time.  PREVENT clinical trial: Atorvastatin versus placebo: Patient is  experiencing myalgias grade 1, Which could be related to prior chemotherapy-induced neuropathy versus the study drug. She is scheduled to undergo cardiac MRI soon.  Hypertension: patient is currently seeing cardiology. She is currently on metoprolol and Norvasc. Her blood pressure still continues to remain high.  Current Treatment: Adjuvant antiestrogen therapy with tamoxifen 20 mg daily 10 years Started 06/22/2015  Tamoxifen toxicities: 1. Hot flashes more than her usual: Moderate in severity 2. Resumption of menstrual bleeding today she had another episode of menstrual bleeding.  3. Memory issues: Mild  Surveillance: Mammograms to be done in one month at [facility] Return to clinic in 1 yr for follow-up   No orders of the defined types were placed in this encounter.  The patient has a good understanding of the overall plan. she agrees with it. she will call with any problems that may develop before the next visit here.   Rulon Eisenmenger, MD 08/14/16

## 2016-08-15 ENCOUNTER — Ambulatory Visit (HOSPITAL_COMMUNITY)
Admission: RE | Admit: 2016-08-15 | Discharge: 2016-08-15 | Disposition: A | Discharge status: Home / Self Care | Payer: Medicaid Other | Source: Ambulatory Visit | Attending: Hematology and Oncology | Admitting: Hematology and Oncology

## 2016-08-15 ENCOUNTER — Other Ambulatory Visit: Payer: Self-pay | Admitting: *Deleted

## 2016-08-15 DIAGNOSIS — C50911 Malignant neoplasm of unspecified site of right female breast: Secondary | ICD-10-CM

## 2016-08-15 NOTE — Progress Notes (Signed)
08/15/2016 PREVENT Upon review of study calendars, patient documented on August calendar that she did not take any pills in August. Marcellus Scott, RN, BSN, MHA, OCN

## 2016-08-18 ENCOUNTER — Telehealth: Payer: Self-pay | Admitting: Hematology and Oncology

## 2016-08-18 NOTE — Telephone Encounter (Signed)
I called the patient to inform her that the MRI performed for the research study PREVENT showed evidence of uterine fibroids. This could be the reason why she had the recent bout of bleeding. I left a message for her to call us back to discuss the result but also to make an appointment with her gynecologist for further evaluation. This is especially important since she takes tamoxifen.

## 2016-08-22 ENCOUNTER — Encounter: Payer: Self-pay | Admitting: Hematology and Oncology

## 2016-08-27 ENCOUNTER — Other Ambulatory Visit: Payer: Self-pay | Admitting: Nurse Practitioner

## 2016-09-01 ENCOUNTER — Other Ambulatory Visit: Payer: Self-pay | Admitting: Obstetrics and Gynecology

## 2016-09-01 ENCOUNTER — Encounter: Payer: Self-pay | Admitting: *Deleted

## 2016-09-01 NOTE — Progress Notes (Signed)
09/01/16 at 9:35am - Follow up call to Barbara Myers office regarding cardiac MRI  - Barbara research nurse called and spoke to Barbara Myers, [place] nurse, regarding Barbara Myers's suspected uterine fibroids.  Barbara Myers was informed that Barbara Myers was a research participant in a clinical trial, and Barbara Myers had a study-related cardiac MRI done on 08/15/16.  Barbara MRI technologist notified Barbara research staff on 08/15/16 that Barbara Myers had "something in her uterus, probably a fibroid" that she noted during Barbara Myers's study related cardiac MRI.  Barbara MRI technologist, Henrietta Dine, knew that Barbara Myers's cardiac MRI was submitted electronically to [facility], but she felt that her uterus needed to be evaluated.  Dr. Lindi Adie was notified that Barbara MRI technologist was concerned about this uterine finding.  Barbara Geophysicist/field seismologist, Kenton Kingfisher, contacted [facility], and Dr. Deniece Portela looked at both images.  He felt that they were fibroids.  Dr. Lindi Adie was informed of Dr. Deniece Portela impression.  Dr. Lindi Adie called Barbara Myers and asked that she follow up with her gynecologist regarding her uterine fibroids (noted during Barbara abdominal images on Barbara MRI cardiac exam) and her recent "bout of bleeding".  Barbara Myers contacted [facility] on 08/22/16 asking that her MRI report be sent to [facility] for [employer] to review.  Barbara research informed Barbara Paganini, RN  today that there is no official MRI report to send regarding her suspected fibroids since this finding was not mentioned in Barbara cardiac MRI clinical report and there was no "local read" report generated.  Barbara Myers was informed that Barbara Myers needs to be evaluated since she is on tamoxifen. Barbara Myers said that she would inform Laurin Coder that there is no MRI report available, and she would get Barbara Myers scheduled for further evaluation.  Barbara Myers was given Barbara research nurse's contact information in case Laurin Coder had any further questions. Barbara research nurse will contact Barbara Myers and inform her that she should be  scheduled for evaluation at [place]. Dr. Geralyn Flash nurse May, RN, was also notified that Barbara research nurse contacted [place] office and that there is no MRI report to send them.   Brion Aliment RN, BSN, CCRP  Clinical Research Nurse 09/01/2016 9:53 AM

## 2016-09-11 ENCOUNTER — Encounter: Payer: Self-pay | Admitting: *Deleted

## 2016-09-11 NOTE — Progress Notes (Signed)
09/11/16 at 1:54pm - PREVENT study- month 25 call - The research nurse called the pt to check on her since she completed the study on 08/15/16.  The research nurse attempted to reach the pt by phone.  However, the pt's only phone number had a message stating the "voice mail" was full.  The research nurse was unable to leave the pt a message.  The research nurse will continue to call the pt to conduct the month 25 telephone call.  The research nurse also requested the pt's OB-GYN office notes today.   Brion Aliment RN, BSN, CCRP Clinical Research Nurse 09/11/2016 1:58 PM   09/12/16 at 10:38am - The research nurse attempted to reach the pt by phone again this morning.  The research was not to speak directly with the pt, but the nurse was able to leave a detailed message asking the pt to return the nurse's call for the month 25 call.  Will await the pt's return call. Brion Aliment RN, BSN, CCRP  Clinical Research Nurse 09/12/2016 10:40 AM   09/19/16 at 8:59am - The research nurse called the pt again for the month 25 telephone contact.  The research nurse was unable to talk to the pt directly, but the nurse was able to leave a voice mail asking the pt to return the nurse's call.  Will await pt's return call.   Brion Aliment RN, BSN,CCRP Clinical Research Nurse 09/19/2016 9:00 AM   09/26/16 at 9:27am - The research nurse called the pt to conduct the month 25 call.  The nurse left another voice mail asking the pt to return the nurse's call.  The research nurse informed Dr. Danny Lawless, the study PI, on 09/25/16 at the site's teleconference about the site's difficulty in reaching the pt.  Dr. Lindi Adie, pt's treating physician, is aware that numerous attempts have been made to talk to the pt.   The site is unable to complete the month 25 assessments.  Will await pt's return call.   Brion Aliment RN, BSN, [place]  Clinical Research Nurse 09/26/2016 9:31 AM    10/16/16 at 9:15am - The pt has not returned the nurse's  numerous calls.  The research nurse mailed a letter to the pt on 10/03/16 asking her to complete the month 25 form, sign, date, and return the form in a self-addressed envelope.   The research nurse has not received the completed month 25 form the pt.  The research nurse will continue to await the pt's return call and/or completed month 25 form.  The research nurse will notify the study DM team that the month 25 form has not been completed due to the pt's failure to return calls.   Brion Aliment RN, BSN,CCRP Clinical Research Nurse 10/16/2016 9:21 AM

## 2017-01-26 ENCOUNTER — Other Ambulatory Visit (HOSPITAL_COMMUNITY): Payer: Self-pay | Admitting: Cardiology

## 2017-02-22 ENCOUNTER — Other Ambulatory Visit: Payer: Self-pay | Admitting: Hematology and Oncology

## 2017-06-03 ENCOUNTER — Other Ambulatory Visit: Payer: Self-pay | Admitting: Hematology and Oncology

## 2017-06-03 DIAGNOSIS — C50411 Malignant neoplasm of upper-outer quadrant of right female breast: Secondary | ICD-10-CM

## 2017-07-15 ENCOUNTER — Encounter (HOSPITAL_COMMUNITY): Payer: Self-pay | Admitting: *Deleted

## 2017-08-12 NOTE — Assessment & Plan Note (Deleted)
Rt Lumpectomy 01/16/15: 1.2 cm IDC 1/1 LN Positive eith ECE, Grade 3,Er 100%, PR 15%, Her 2 neg: 1.13 T1cN1M0 Stage 2A  (Pre-Op:Right breast invasive ductal carcinoma 2 cm by ultrasound one axillary lymph node biopsy positive, ER positive, PR positive, HER-2 equivocal [employer] HER 2 Negative, grade 3, Ki-67 40%, T2 N1 M0 stage IIB clinical stage) S/P AC X 4 foll by Taxol X 12  Reexcision and axillary lymph node dissection 02/08/2015 : Benign breast tissue; right axillary lymph node dissection: 1/11 lymph nodes positive for micrometastatic disease (tumor deposit 1.5 mm)  Chemotherapy-induced neuropathy: improving over time.  PREVENT clinical trial: Atorvastatin versus placebo: Patient is experiencing myalgias grade 1, Which could be related to prior chemotherapy-induced neuropathy versus the study drug. She is scheduled to undergo cardiac MRI soon.  Hypertension: patient is currently seeing cardiology. She is currently on metoprolol and Norvasc. Her blood pressure still continues to remain high.  Current Treatment: Adjuvant antiestrogen therapy with tamoxifen 20 mg daily 10 years Started 06/22/2015  Tamoxifen toxicities: 1. Hot flashes more than her usual: Moderate in severity  Surveillance: Mammograms to be done in one month at [facility] Return to clinic in 1 yr for follow-up

## 2017-08-13 ENCOUNTER — Ambulatory Visit: Payer: Medicaid Other | Admitting: Hematology and Oncology

## 2017-08-13 NOTE — Progress Notes (Deleted)
Patient Care Team: Patient, No Pcp Per as PCP - General (General Practice) Rolm Bookbinder, MD as Consulting Physician (General Surgery) Nicholas Lose, MD as Consulting Physician (Hematology and Oncology) Thea Silversmith, MD (Inactive) as Consulting Physician (Radiation Oncology) Holley Bouche, NP as Nurse Practitioner (Nurse Practitioner) Carola Frost, RN as Registered Nurse (Oncology) Sylvan Cheese, NP as Nurse Practitioner (Hematology and Oncology)  DIAGNOSIS:  Encounter Diagnosis  Name Primary?  . Malignant neoplasm of upper-outer quadrant of right breast in female, estrogen receptor positive ([place])     SUMMARY OF ONCOLOGIC HISTORY:   Breast cancer of upper-outer quadrant of right female breast ([place])   07/13/2014 Mammogram    Right breast: indeterminate mass measuring 2 cm at 10:00 position      07/20/2014 Initial Biopsy    Right breast bx: Invasive ductal carcinoma, grade 3, ER+ (100%), PR+ (80%), HER-2 equivocal ratio 1.55 (repeated and negative w/ ratio 1.20), Ki-67 46%. Rt axillary LN bx: metastatic carcinoma (1/1).      07/31/2014 Procedure    Genetic testing revealed three variants of unknown significance - MRE11A c.256G>C, PMS2 c.1004A>G, and RAD51C c.200A>G - but was otherwise normal, and did not reveal a deleterious mutation in these genes      08/01/2014 Breast MRI    Right breast: enhancement measuring 3.9 x 2.7 x 2.5 cm;  no other areas of enhancement in right breast.  None in left breast.  Right axillary LN measuring 7 mm corresponding with biopsied node.      08/01/2014 Clinical Stage    Stage IIB: T2 N1      08/09/2014 - 12/19/2014 Neo-Adjuvant Chemotherapy    Dose dense Adriamycin and Cytoxan 4 followed by Taxol 12      12/26/2014 Breast MRI    Decreased size of right breast malignancy, now measuring up to 3.8 cm AP, previously measured up to 4.6 cm AP when remeasured in a similar manner.  No abnormal appearing LN (including  previously seen right axillary node)      01/16/2015 Definitive Surgery    Right Lumpectomy/SLNB Donne Hazel): IDC, measuring 1.2 cm,  1/3 LN Positive with ECE, Grade 3, ER+ (100%), PR+ (15%), HER/2 neg: (ratio 1.13)       01/16/2015 Pathologic Stage    Stage IIA (ypT1c ypN1 M0)      02/08/2015 Surgery    Right breast post margin excision/ALD: Benign breast tissue; right axillary lymph node dissection: 1/11 lymph nodes positive for micrometastatic disease (tumor deposit 1.5 mm)      04/05/2015 - 05/22/2015 Radiation Therapy    Adjuvant RT Pablo Ledger): Right breast 45 Gy over 25 fractions. Right supraclavicular fossa 45 Gy over 25 fractions. Right breast boost: 16 Gy over 8 fractions. Total dose: 61 Gy.      06/22/2015 -  Anti-estrogen oral therapy    Tamoxifen 20 mg daily. Planned duration of therapy 10 years.      08/16/2015 Survivorship    Survivorship care plan given to patient in lieu of in person visit.       CHIEF COMPLIANT: Follow-up on tamoxifen therapy  INTERVAL HISTORY: Barbara Myers is a 42 year old with above-mentioned history of right breast cancer treated with neoadjuvant chemotherapy followed by lumpectomy and radiation.  She is currently on tamoxifen and appears to be tolerating it fairly well.  She is been on tamoxifen for the past 2 years.  She has had hot flashes as a side effect of treatment which is moderate in severity.  She denies any  lumps or nodules in the breast.  REVIEW OF SYSTEMS:   Constitutional: Denies fevers, chills or abnormal weight loss Eyes: Denies blurriness of vision Ears, nose, mouth, throat, and face: Denies mucositis or sore throat Respiratory: Denies cough, dyspnea or wheezes Cardiovascular: Denies palpitation, chest discomfort Gastrointestinal:  Denies nausea, heartburn or change in bowel habits Skin: Denies abnormal skin rashes Lymphatics: Denies new lymphadenopathy or easy bruising Neurological:Denies numbness, tingling or new  weaknesses Behavioral/Psych: Mood is stable, no new changes  Extremities: No lower extremity edema Breast:  denies any pain or lumps or nodules in either breasts All other systems were reviewed with the patient and are negative.  I have reviewed the past medical history, past surgical history, social history and family history with the patient and they are unchanged from previous note.  ALLERGIES:  is allergic to tape.  MEDICATIONS:  Current Outpatient Medications  Medication Sig Dispense Refill  . carvedilol (COREG) 12.5 MG tablet Take 1 tablet (12.5 mg total) by mouth 2 (two) times daily. 60 tablet 6  . hydrochlorothiazide (HYDRODIURIL) 25 MG tablet TAKE 1 TABLET (25 MG TOTAL) BY MOUTH DAILY. 30 tablet 9  . tamoxifen (NOLVADEX) 20 MG tablet TAKE 1 TABLET BY MOUTH EVERY DAY 90 tablet 3   No current facility-administered medications for this visit.     PHYSICAL EXAMINATION: ECOG PERFORMANCE STATUS: 1 - Symptomatic but completely ambulatory  There were no vitals filed for this visit. There were no vitals filed for this visit.  GENERAL:alert, no distress and comfortable SKIN: skin color, texture, turgor are normal, no rashes or significant lesions EYES: normal, Conjunctiva are pink and non-injected, sclera clear OROPHARYNX:no exudate, no erythema and lips, buccal mucosa, and tongue normal  NECK: supple, thyroid normal size, non-tender, without nodularity LYMPH:  no palpable lymphadenopathy in the cervical, axillary or inguinal LUNGS: clear to auscultation and percussion with normal breathing effort HEART: regular rate & rhythm and no murmurs and no lower extremity edema ABDOMEN:abdomen soft, non-tender and normal bowel sounds MUSCULOSKELETAL:no cyanosis of digits and no clubbing  NEURO: alert & oriented x 3 with fluent speech, no focal motor/sensory deficits EXTREMITIES: No lower extremity edema BREAST: No palpable masses or nodules in either right or left breasts. No palpable  axillary supraclavicular or infraclavicular adenopathy no breast tenderness or nipple discharge. (exam performed in the presence of a chaperone)  LABORATORY DATA:  I have reviewed the data as listed   Chemistry      Component Value Date/Time   NA 141 05/11/2015 1352   K 3.8 05/11/2015 1352   CL 105 02/09/2015 0720   CO2 29 05/11/2015 1352   BUN 8.3 05/11/2015 1352   CREATININE 0.79 08/24/2015 0740   CREATININE 0.8 05/11/2015 1352      Component Value Date/Time   CALCIUM 8.8 05/11/2015 1352   ALKPHOS 80 05/11/2015 1352   AST 20 05/11/2015 1352   ALT 24 05/11/2015 1352   BILITOT 0.38 05/11/2015 1352       Lab Results  Component Value Date   WBC 3.9 05/11/2015   HGB 11.1 (L) 05/11/2015   HCT 34.3 (L) 05/11/2015   MCV 87.7 05/11/2015   PLT 203 05/11/2015   NEUTROABS 2.7 05/11/2015    ASSESSMENT & PLAN:  Breast cancer of upper-outer quadrant of right female breast ([place]) Rt Lumpectomy 01/16/15: 1.2 cm IDC 1/1 LN Positive eith ECE, Grade 3,Er 100%, PR 15%, Her 2 neg: 1.13 T1cN1M0 Stage 2A  (Pre-Op:Right breast invasive ductal carcinoma 2 cm by ultrasound one  axillary lymph node biopsy positive, ER positive, PR positive, HER-2 equivocal [employer] HER 2 Negative, grade 3, Ki-67 40%, T2 N1 M0 stage IIB clinical stage) S/P AC X 4 foll by Taxol X 12  Reexcision and axillary lymph node dissection 02/08/2015 : Benign breast tissue; right axillary lymph node dissection: 1/11 lymph nodes positive for micrometastatic disease (tumor deposit 1.5 mm)  Chemotherapy-induced neuropathy: improving over time.  PREVENT clinical trial: Atorvastatin versus placebo: Patient is experiencing myalgias grade 1, Which could be related to prior chemotherapy-induced neuropathy versus the study drug. She is scheduled to undergo cardiac MRI soon.  Hypertension: patient is currently seeing cardiology. She is currently on metoprolol and Norvasc. Her blood pressure still continues to remain  high.  Current Treatment: Adjuvant antiestrogen therapy with tamoxifen 20 mg daily 10 years Started 06/22/2015  Tamoxifen toxicities: 1. Hot flashes more than her usual: Moderate in severity  Surveillance: Mammograms to be done in one month at [facility] Return to clinic in 1 yr for follow-up     I spent 25 minutes talking to the patient of which more than half was spent in counseling and coordination of care.  No orders of the defined types were placed in this encounter.  The patient has a good understanding of the overall plan. she agrees with it. she will call with any problems that may develop before the next visit here.   Harriette Ohara, MD 08/13/17

## 2017-09-28 ENCOUNTER — Emergency Department (HOSPITAL_COMMUNITY)
Admission: EM | Admit: 2017-09-28 | Discharge: 2017-09-28 | Disposition: A | Discharge status: Home / Self Care | Payer: Medicaid Other | Attending: Emergency Medicine | Admitting: Emergency Medicine

## 2017-09-28 ENCOUNTER — Emergency Department (HOSPITAL_COMMUNITY): Payer: Medicaid Other

## 2017-09-28 ENCOUNTER — Encounter (HOSPITAL_COMMUNITY): Payer: Self-pay | Admitting: Emergency Medicine

## 2017-09-28 DIAGNOSIS — I10 Essential (primary) hypertension: Secondary | ICD-10-CM | POA: Insufficient documentation

## 2017-09-28 DIAGNOSIS — R51 Headache: Secondary | ICD-10-CM | POA: Insufficient documentation

## 2017-09-28 DIAGNOSIS — Z79899 Other long term (current) drug therapy: Secondary | ICD-10-CM | POA: Diagnosis not present

## 2017-09-28 DIAGNOSIS — R519 Headache, unspecified: Secondary | ICD-10-CM

## 2017-09-28 DIAGNOSIS — Z87891 Personal history of nicotine dependence: Secondary | ICD-10-CM | POA: Insufficient documentation

## 2017-09-28 MED ORDER — KETOROLAC TROMETHAMINE 30 MG/ML IJ SOLN
30.0000 mg | Freq: Once | INTRAMUSCULAR | Status: AC
  Administered 2017-09-28: 30 mg via INTRAMUSCULAR
  Filled 2017-09-28: qty 1

## 2017-09-28 MED ORDER — CARVEDILOL 12.5 MG PO TABS: 12.5000 mg | ORAL_TABLET | Freq: Every day | ORAL | 0 refills | Status: DC

## 2017-09-28 MED ORDER — CARVEDILOL 12.5 MG PO TABS: 12.5000 mg | ORAL_TABLET | Freq: Two times a day (BID) | ORAL | 0 refills | Status: DC

## 2017-09-28 MED ORDER — ACETAMINOPHEN 325 MG PO TABS
650.0000 mg | ORAL_TABLET | Freq: Once | ORAL | Status: AC
  Administered 2017-09-28: 650 mg via ORAL
  Filled 2017-09-28: qty 2

## 2017-09-28 NOTE — ED Notes (Signed)
Visual acuity screening, with patient glasses on. Right eye: 20/25 Left eye: 20/20 Bilateral: 20/16

## 2017-09-28 NOTE — ED Provider Notes (Signed)
[facility] DEPT Provider Note   CSN: [id number] Arrival date & time: 09/28/17  1456     History   Chief Complaint Chief Complaint  Patient presents with  . Headache    HPI Barbara Myers is a 42 y.o. female.  HPI   Pt presenting to the ED with a h/o breast CA and HTN c/o HA that began about 1 week ago. Started gradually. Rates it at a 7-8/10. Pain has worsened since it started. Pain is located on the left side of her head, it is intermittently dull and throbbing, and sometimes sharp in nature. Has had headaches in the past that last for one day, but no headache that has lasted this long. Headaches usually resolve on their own, however this one has not. Has taken ibuprofen with no relief. Pain is worse with movement. Also reports photophobia, vision changes (intermittently blurry to left eye), and some lightheadedness.   She reports one day of nausea, but this has resolved. No fevers, chest pain, shortness of breath, vomiting, dizziness, weakness, numbness, urinary problems, confusion, diarrhea, or eye pain. Reports some nasal congestion intermittently for one day, states that it is associated with temperature changes. Denies other URI sxs.   Pt states that she did not take HCTZ today. States she does not take this for BP, she takes it for fluid. Saw eye doctor a few months ago.   Past Medical History:  Diagnosis Date  . Anemia   . Anxiety   . Breast cancer of upper-outer quadrant of right female breast ([place]) 07/24/2014  . Hypertension    bp elevated during chemo, not before  . Wears glasses     Patient Active Problem List   Diagnosis Date Noted  . 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 Pruritus 12/15/2014  . Genetic testing 09/18/2014  . Iron deficiency anemia due to chronic blood loss 07/26/2014  . Breast cancer of  upper-outer quadrant of right female breast ([place]) 07/24/2014    Past Surgical History:  Procedure Laterality Date  . BREAST LUMPECTOMY WITH AXILLARY LYMPH NODE DISSECTION Right 02/08/2015   Procedure: RIGHT AXILLARY LYMPH NODE DISECTION, RIGHT RE-EXCISION OF MARGIN;  Surgeon: Rolm Bookbinder, MD;  Location: [facility];  Service: General;  Laterality: Right;  . PORT-A-CATH REMOVAL Left 02/08/2015   Procedure: REMOVAL PORT-A-CATH;  Surgeon: Rolm Bookbinder, MD;  Location: [facility];  Service: General;  Laterality: Left;  . PORTACATH PLACEMENT N/A 08/08/2014   Procedure: INSERTION PORT-A-CATH left subclavian;  Surgeon: Rolm Bookbinder, MD;  Location: [facility];  Service: General;  Laterality: N/A;  . RADIOACTIVE SEED GUIDED PARTIAL MASTECTOMY WITH AXILLARY SENTINEL LYMPH NODE BIOPSY Right 01/16/2015   Procedure: RADIOACTIVE SEED GUIDED PARTIAL MASTECTOMY WITH RIGHT RADIOACTIVE SEED GUIDED AXILLARY SENTINEL LYMPH NODE BIOPSY;  Surgeon: Rolm Bookbinder, MD;  Location: [facility];  Service: General;  Laterality: Right;  . [place]  . WISDOM TOOTH EXTRACTION      OB History    Gravida Para Term Preterm AB Living   2 2 2     2    SAB TAB Ectopic Multiple Live Births                   Home Medications    Prior to Admission medications   Medication Sig Start Date End Date Taking? Authorizing Provider  hydrochlorothiazide (HYDRODIURIL)  25 MG tablet TAKE 1 TABLET (25 MG TOTAL) BY MOUTH DAILY. 02/23/17  Yes Nicholas Lose, MD  ibuprofen (ADVIL,MOTRIN) 200 MG tablet Take 200 mg by mouth every 6 (six) hours as needed for headache.   Yes [provider]  naproxen (NAPROSYN) 500 MG tablet Take 500 mg by mouth 2 (two) times daily with a meal. 08/12/17  Yes [provider]  tamoxifen (NOLVADEX) 20 MG tablet TAKE 1 TABLET BY MOUTH EVERY DAY 06/03/17  Yes Nicholas Lose, MD  carvedilol (COREG) 12.5 MG  tablet Take 1 tablet (12.5 mg total) by mouth 2 (two) times daily. 09/28/17   Caio Devera S, PA-C    Family History Family History  Problem Relation Age of Onset  . Hypertension Mother   . Breast cancer Maternal Grandmother        dx in her 53s  . Lung cancer Maternal Aunt        dx in her 46s; smoker  . Prostate cancer Paternal Grandfather     Social History Social History   Tobacco Use  . Smoking status: Former Smoker    Packs/day: 0.01    Types: Cigarettes    Last attempt to quit: 07/31/1994    Years since quitting: 23.1  . Smokeless tobacco: Never Used  Substance Use Topics  . Alcohol use: Yes    Alcohol/week: 0.0 oz    Comment: ocassionally  . Drug use: No     Allergies   Tape   Review of Systems Review of Systems  Constitutional: Negative for chills and fever.  HENT: Positive for congestion (intermittent). Negative for ear pain and sore throat.   Eyes: Positive for photophobia and visual disturbance. Negative for pain and redness.  Respiratory: Negative for cough and shortness of breath.   Cardiovascular: Negative for chest pain and palpitations.  Gastrointestinal: Negative for abdominal pain and vomiting.  Genitourinary: Negative for dysuria and hematuria.  Musculoskeletal: Negative for arthralgias and back pain.  Skin: Negative for color change and rash.  Neurological: Positive for light-headedness and headaches. Negative for dizziness, seizures, syncope, speech difficulty and weakness.  Psychiatric/Behavioral: Negative for confusion.  All other systems reviewed and are negative.    Physical Exam Updated Vital Signs BP (!) 139/116 (BP Location: Right Arm)   Pulse 97   Temp 99.1 F (37.3 C) (Oral)   Resp 18   Ht 5\' 5"  (1.651 m)   Wt 96.2 kg (212 lb)   SpO2 100%   BMI 35.28 kg/m   Physical Exam  Constitutional: She is oriented to person, place, and time. She appears well-developed and well-nourished. She does not appear ill. No distress.    HENT:  Head: Normocephalic and atraumatic.  Mouth/Throat: Oropharynx is clear and moist.  bilat TMs normal, no pain to bilat temples  Eyes: Conjunctivae and EOM are normal. Pupils are equal, round, and reactive to light. Right eye exhibits no nystagmus. Left eye exhibits no nystagmus.  Normal conjunctiva bilaterally, no redness. No temporal pain with palpation.  Neck: Normal range of motion. Neck supple. No neck rigidity.  No nuchal rigidity.  Able to fully flex neck to chest, no issues with range of motion. TTP to right paraspinous muscles and right trapezius muscles  Cardiovascular: Normal rate, regular rhythm, normal heart sounds and intact distal pulses.  No murmur heard. Pulmonary/Chest: Effort normal and breath sounds normal. No respiratory distress.  Abdominal: Soft. There is no tenderness.  Musculoskeletal: She exhibits no edema.  Neurological: She is alert and oriented to  person, place, and time.  Mental Status:  Alert, thought content appropriate, able to give a coherent history. Speech fluent without evidence of aphasia. Able to follow 2 step commands without difficulty.  Cranial Nerves:  II:  Peripheral visual fields grossly normal, pupils equal, round, reactive to light III,IV, VI: ptosis not present, extra-ocular motions intact bilaterally  V,VII: smile symmetric, facial light touch sensation equal VIII: hearing grossly normal to voice  X: uvula elevates symmetrically  XI: bilateral shoulder shrug symmetric and strong XII: midline tongue extension without fassiculations Motor:  Normal tone. 5/5 strength of BUE and BLE major muscle groups including strong and equal grip strength and dorsiflexion/plantar flexion Sensory: light touch normal in all extremities. DTRs: patellar 1+ bilaterally Cerebellar: normal finger-to-nose with bilateral upper extremities Gait: normal gait and balance. Able to walk on toes and heels with ease.  CV: 2+ radial and DP/PT pulses  Skin: Skin is  warm and dry. Capillary refill takes less than 2 seconds.  Psychiatric: She has a normal mood and affect.  Nursing note and vitals reviewed.    ED Treatments / Results  Labs (all labs ordered are listed, but only abnormal results are displayed) Labs Reviewed - No data to display  EKG  EKG Interpretation None       Radiology Ct Head Wo Contrast  Result Date: 09/28/2017 CLINICAL DATA:  Patient c/o left side headache since last Tuesday with blurred vision in left eye. Patient reports that she did hear popping noise couple days ago. EXAM: CT HEAD WITHOUT CONTRAST TECHNIQUE: Contiguous axial images were obtained from the base of the skull through the vertex without intravenous contrast. COMPARISON:  None. FINDINGS: Brain: There is no intra or extra-axial mass lesion. 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 There is no CT evidence for acute infarction or hemorrhage. Vascular: No hyperdense vessel or unexpected calcification. Skull: Normal. Negative for fracture or focal lesion. Sinuses/Orbits: No acute finding. Other: None IMPRESSION: 1.  No evidence for acute  abnormality. 2. 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 Electronically Signed   By: Nolon Nations M.D.   On: 09/28/2017 18:38    Procedures Procedures (including critical care time)  Medications Ordered in ED Medications  acetaminophen (TYLENOL) tablet 650 mg (650 mg Oral Given 09/28/17 1934)  ketorolac (TORADOL) 30 MG/ML injection 30 mg (30 mg Intramuscular Given 09/28/17 1934)     Initial Impression / Assessment and Plan / ED Course  I have reviewed the triage vital signs and the nursing notes.  Pertinent labs & imaging results that were available during my care of the patient were reviewed by me and considered in my medical decision making (see chart for details).      Discussed pt presentation and exam findings with Dr. Zenia Resides who evaluated the patient and agrees that exam nonconcerning for acute angle closure glaucoma. He reviewed the results of her CT scan.  He agrees with the plan for the discharge and advised me to start the patient on her prior HTN medications, 12.5mg  carvedilol BID. Advised outpatient neurology follow-up and PCP follow-up about her blood pressure.   Final Clinical Impressions(s) / ED Diagnoses   Final diagnoses:  Nonintractable headache, unspecified chronicity pattern, unspecified headache type   Pt HA treated and improved while in ED.  Presentation is like pts typical HA and non concerning for [facility], ICH, Meningitis, or temporal  arteritis. Pt is afebrile with no focal neuro deficits, nuchal rigidity, or change in vision. Pt is to follow up with PCP to discuss prophylactic medication. Pt verbalizes understanding and is agreeable with plan to dc.   Patient presenting to the ED with left sided headache, with intermittent blurry vision.  Blood pressure elevated to 173/108 initially. Improved to 139/119 spontaneously in the ED. remainder of vital signs stable.  Nontoxic-appearing and afebrile.  No nuchal rigidity, no focal neuro deficits.  No evidence of acute angle glaucoma on exam.  Low concern for other ophthalmologic pathology at this time given no persistent eye complaints. Visual acuity WNL. No eye redness, tearing, or pain. No vomiting in the ED.  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   Symptoms sound most consistent with a migraine headache.  Patient driving home so was given shot of Toradol and Tylenol in the ED. Results were discussed with the patient and she was given neurology follow-up.  She was given strict return precautions.  Patient's blood pressure elevated in the emergency department today. Patient denies numbness, weakness, chest pain,  dyspnea, dizziness therefore doubt hypertensive emergency. Also with head CT showing no hemorrhage. Discussed elevated blood pressure with the patient and the need for primary care follow up with potential need to change antihypertensive medications and or for further evaluation. Gave Rx for pts prior HTN medications. Discussed return precaution signs/symptoms for hypertensive emergency as listed above with the patient. She confirmed understanding.    ED Discharge Orders        Ordered    carvedilol (COREG) 12.5 MG tablet  Daily,   Status:  Discontinued     09/28/17 1956    carvedilol (COREG) 12.5 MG tablet  2 times daily     09/28/17 [address] Charlestine Rookstool S, PA-C 09/29/17 0118

## 2017-09-28 NOTE — ED Triage Notes (Signed)
Patient c/o left side headache since last Tuesday with blurred vision in left eye. Patient reports that she did hear popping noise couple days ago. Pt sister died of double aneurysm.

## 2017-09-28 NOTE — Discharge Instructions (Signed)
Please follow-up with neurology, and contact the office tomorrow to make an appointment for follow-up within the next 7 days if possible.  If any of the following occur notify your physician or go to the Hospital Emergency Department:  Increased drowsiness, stupor or loss of consciousness  Restlessness or convulsions (fits)  Paralysis in arms or legs  Temperature above 100 F  Vomiting  Severe headache  Blood or clear fluid dripping from the nose or ears  Stiffness of the neck  Dizziness or blurred vision  Pulsating pain in the eye  Unequal pupils of eye  Personality changes  Any other unusual symptoms   You were noted to have high blood pressure today during your visit in the emergency department.  You were given a prescription to be restarted on your prior blood pressure medications.  You should take this medication 2 times per day and you will need to follow up with your primary healthcare provider to have your blood pressure rechecked.  If you do not have a primary care provider, information for a primary care providers has been provided for you and your discharge instructions.  If you experience any chest pain, shortness of breath, headaches, vision changes, numbness, weakness, lightheadedness, changes in mental status, or decrease in urination you should return to the emergency department immediately.

## 2017-09-28 NOTE — ED Provider Notes (Signed)
Medical screening examination/treatment/procedure(s) were conducted as a shared visit with non-physician practitioner(s) and myself.  I personally evaluated the patient during the encounter.   EKG Interpretation None     42 year old female presents with left-sided headache times several days.  Describes sharp pain with some blurred vision is not persistent.  No fever or chills.  No vomiting.  Her neurological exam here is stable.  Head CT shows an arachnoid cyst.  She is also mildly hypertensive.  Will restart her carvedilol and she will follow-up with her doctor   Lacretia Leigh, MD 09/28/17 [id number]

## 2017-10-08 ENCOUNTER — Ambulatory Visit: Payer: PRIVATE HEALTH INSURANCE | Admitting: Family Medicine

## 2017-10-08 ENCOUNTER — Encounter: Payer: Self-pay | Admitting: Family Medicine

## 2017-10-08 VITALS — BP 120/82 | HR 82 | Ht 65.0 in | Wt 213.0 lb

## 2017-10-08 DIAGNOSIS — I10 Essential (primary) hypertension: Secondary | ICD-10-CM | POA: Diagnosis not present

## 2017-10-08 DIAGNOSIS — C50411 Malignant neoplasm of upper-outer quadrant of right female breast: Secondary | ICD-10-CM

## 2017-10-08 DIAGNOSIS — Z170 Estrogen receptor positive status [ER+]: Secondary | ICD-10-CM

## 2017-10-08 DIAGNOSIS — T451X5A Adverse effect of antineoplastic and immunosuppressive drugs, initial encounter: Secondary | ICD-10-CM | POA: Diagnosis not present

## 2017-10-08 DIAGNOSIS — G930 Cerebral cysts: Secondary | ICD-10-CM | POA: Diagnosis not present

## 2017-10-08 DIAGNOSIS — I427 Cardiomyopathy due to drug and external agent: Secondary | ICD-10-CM | POA: Diagnosis not present

## 2017-10-08 MED ORDER — CARVEDILOL 12.5 MG PO TABS: 12.5000 mg | ORAL_TABLET | Freq: Two times a day (BID) | ORAL | 0 refills | Status: DC

## 2017-10-08 MED ORDER — HYDROCHLOROTHIAZIDE 25 MG PO TABS: 25.0000 mg | ORAL_TABLET | Freq: Every day | ORAL | 9 refills | Status: DC

## 2017-10-08 NOTE — Progress Notes (Signed)
Subjective:  Patient ID: Barbara Myers, female    DOB: 02/15/1976  Age: 42 y.o. MRN: [id number]  CC: New Patient (Initial Visit)   HPI Lezly Rumpf presents for follow up of blood pressure. She is 2 years sp cure of breast cancer. Apparent cardiomyopathy from treatment lead to the development of htn. Bp is well controlled when taking the carvedilol and hctz. She has not issues with these meds. Distant ho of migraines that come maybe twice a year. Seen in the er for a headache that was different from her usual headache with elevated bp. 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 Arachnoid cyst. These were not suggestive of metastatic dz. She lives with her husband and 2 children. Rare etoh. No tobacco or illicit drug use.   History Rinnah has a past medical history of Anemia, Anxiety, Breast cancer of upper-outer quadrant of right female breast ([place]) (07/24/2014), Hypertension, and Wears glasses.   She has a past surgical history that includes Wisdom tooth extraction; Umbilical hernia repair; Portacath placement (N/A, 08/08/2014); Radioactive seed guided mastectomy with axillary sentinel lymph node biopsy (Right, 01/16/2015); Breast lumpectomy with axillary lymph node dissection (Right, 02/08/2015); and Port-a-cath removal (Left, 02/08/2015).   Her family history includes Breast cancer in her maternal grandmother; Hypertension in her mother; Lung cancer in her maternal aunt; Prostate cancer in her paternal grandfather.She reports that she quit smoking about 23 years ago. Her smoking use included cigarettes. She smoked 0.01 packs per day. she has never used smokeless tobacco. She reports that she drinks alcohol. She reports that she does not use drugs.  Outpatient Medications Prior to Visit  Medication Sig Dispense Refill  . ibuprofen (ADVIL,MOTRIN) 200 MG tablet Take 200 mg by mouth every 6 (six) hours as needed for headache.    . naproxen (NAPROSYN) 500 MG tablet Take 500 mg by mouth 2 (two) times daily with a  meal.  3  . tamoxifen (NOLVADEX) 20 MG tablet TAKE 1 TABLET BY MOUTH EVERY DAY 90 tablet 3  . carvedilol (COREG) 12.5 MG tablet Take 1 tablet (12.5 mg total) by mouth 2 (two) times daily. 60 tablet 0  . hydrochlorothiazide (HYDRODIURIL) 25 MG tablet TAKE 1 TABLET (25 MG TOTAL) BY MOUTH DAILY. 30 tablet 9   No facility-administered medications prior to visit.     ROS Review of Systems  Constitutional: Negative.  Negative for unexpected weight change.  HENT: Negative.   Eyes: Negative.   Respiratory: Negative.   Cardiovascular: Negative.   Gastrointestinal: Negative.   Genitourinary: Negative.   Musculoskeletal: Negative.   Skin: Negative.   Neurological: Negative for weakness and headaches.  Hematological: Does not bruise/bleed easily.  Psychiatric/Behavioral: Negative.     Objective:  BP 120/82 (BP Location: Left Arm, Patient Position: Sitting, Cuff Size: Large)   Pulse 82   Ht 5\' 5"  (1.651 m)   Wt 213 lb (96.6 kg)   BMI 35.45 kg/m   Physical Exam  Constitutional: She is oriented to person, place, and time. She appears well-developed and well-nourished. No distress.  HENT:  Head: Normocephalic and atraumatic.  Right Ear: External ear normal.  Left Ear: External ear normal.  Mouth/Throat: Oropharynx is clear and moist. No oropharyngeal exudate.  Eyes: Conjunctivae are normal. Pupils are equal, round, and reactive to light. Right eye exhibits no discharge. Left eye exhibits no discharge. No scleral icterus.  Neck: Neck supple. No JVD present. No tracheal deviation present. No thyromegaly present.  Cardiovascular: Normal rate, regular rhythm and normal heart sounds.  Pulmonary/Chest:  Effort normal and breath sounds normal. No stridor.  Abdominal: Bowel sounds are normal.  Lymphadenopathy:    She has no cervical adenopathy.  Neurological: She is alert and oriented to person, place, and time.  Skin: Skin is warm and dry. She is not diaphoretic.  Psychiatric: She has a  normal mood and affect. Her behavior is normal.      Assessment & Plan:   Ellawyn was seen today for new patient (initial visit).  Diagnoses and all orders for this visit:  Chemotherapy induced cardiomyopathy ([place]) -     Discontinue: carvedilol (COREG) 12.5 MG tablet; Take 1 tablet (12.5 mg total) by mouth 2 (two) times daily. -     carvedilol (COREG) 12.5 MG tablet; Take 1 tablet (12.5 mg total) by mouth 2 (two) times daily.  Malignant neoplasm of upper-outer quadrant of right breast in female, estrogen receptor positive ([place])  Arachnoid cyst -     Ambulatory referral to Neurology  Essential hypertension -     hydrochlorothiazide (HYDRODIURIL) 25 MG tablet; Take 1 tablet (25 mg total) by mouth daily. -     Discontinue: carvedilol (COREG) 12.5 MG tablet; Take 1 tablet (12.5 mg total) by mouth 2 (two) times daily. -     carvedilol (COREG) 12.5 MG tablet; Take 1 tablet (12.5 mg total) by mouth 2 (two) times daily.   I am having Elaina Pattee maintain her tamoxifen, naproxen, ibuprofen, hydrochlorothiazide, and carvedilol.  Meds ordered this encounter  Medications  . hydrochlorothiazide (HYDRODIURIL) 25 MG tablet    Sig: Take 1 tablet (25 mg total) by mouth daily.    Dispense:  30 tablet    Refill:  9  . DISCONTD: carvedilol (COREG) 12.5 MG tablet    Sig: Take 1 tablet (12.5 mg total) by mouth 2 (two) times daily.    Dispense:  60 tablet    Refill:  0  . carvedilol (COREG) 12.5 MG tablet    Sig: Take 1 tablet (12.5 mg total) by mouth 2 (two) times daily.    Dispense:  180 tablet    Refill:  0     Follow-up: Return in about 3 months (around 01/05/2018), or if symptoms worsen or fail to improve.  Libby Maw, MD

## 2017-10-08 NOTE — Patient Instructions (Addendum)
Carvedilol tablets What is this medicine? CARVEDILOL (KAR ve dil ol) is a beta-blocker. Beta-blockers reduce the workload on the heart and help it to beat more regularly. This medicine is used to treat high blood pressure and heart failure. This medicine may be used for other purposes; ask your health care provider or pharmacist if you have questions. COMMON BRAND NAME(S): Coreg What should I tell my health care provider before I take this medicine? They need to know if you have any of these conditions: -circulation problems -diabetes -history of heart attack or heart disease -liver disease -lung or breathing disease, like asthma or emphysema -pheochromocytoma -slow or irregular heartbeat -thyroid disease -an unusual or allergic reaction to carvedilol, other beta-blockers, medicines, foods, dyes, or preservatives -pregnant or trying to get pregnant -breast-feeding How should I use this medicine? Take this medicine by mouth with a glass of water. Follow the directions on the prescription label. It is best to take the tablets with food. Take your doses at regular intervals. Do not take your medicine more often than directed. Do not stop taking except on the advice of your doctor or health care professional. Talk to your pediatrician regarding the use of this medicine in children. Special care may be needed. Overdosage: If you think you have taken too much of this medicine contact a poison control center or emergency room at once. NOTE: This medicine is only for you. Do not share this medicine with others. What if I miss a dose? If you miss a dose, take it as soon as you can. If it is almost time for your next dose, take only that dose. Do not take double or extra doses. What may interact with this medicine? This medicine may interact with the following medications: -certain medicines for blood pressure, heart disease, irregular heart beat -certain medicines for depression, like fluoxetine  or paroxetine -certain medicines for diabetes, like glipizide or glyburide -cimetidine -clonidine -cyclosporine -digoxin -MAOIs like Carbex, Eldepryl, Marplan, Nardil, and Parnate -reserpine -rifampin This list may not describe all possible interactions. Give your health care provider a list of all the medicines, herbs, non-prescription drugs, or dietary supplements you use. Also tell them if you smoke, drink alcohol, or use illegal drugs. Some items may interact with your medicine. What should I watch for while using this medicine? Check your heart rate and blood pressure regularly while you are taking this medicine. Ask your doctor or health care professional what your heart rate and blood pressure should be, and when you should contact him or her. Do not stop taking this medicine suddenly. This could lead to serious heart-related effects. Contact your doctor or health care professional if you have difficulty breathing while taking this drug. Check your weight daily. Ask your doctor or health care professional when you should notify him/her of any weight gain. You may get drowsy or dizzy. Do not drive, use machinery, or do anything that requires mental alertness until you know how this medicine affects you. To reduce the risk of dizzy or fainting spells, do not sit or stand up quickly. Alcohol can make you more drowsy, and increase flushing and rapid heartbeats. Avoid alcoholic drinks. If you have diabetes, check your blood sugar as directed. Tell your doctor if you have changes in your blood sugar while you are taking this medicine. If you are going to have surgery, tell your doctor or health care professional that you are taking this medicine. What side effects may I notice from receiving this medicine?   Side effects that you should report to your doctor or health care professional as soon as possible: -allergic reactions like skin rash, itching or hives, swelling of the face, lips, or  tongue -breathing problems -dark urine -irregular heartbeat -swollen legs or ankles -vomiting -yellowing of the eyes or skin Side effects that usually do not require medical attention (report to your doctor or health care professional if they continue or are bothersome): -change in sex drive or performance -diarrhea -dry eyes (especially if wearing contact lenses) -dry, itching skin -headache -nausea -unusually tired This list may not describe all possible side effects. Call your doctor for medical advice about side effects. You may report side effects to FDA at 1-800-FDA-1088. Where should I keep my medicine? Keep out of the reach of children. Store at room temperature below 30 degrees C (86 degrees F). Protect from moisture. Keep container tightly closed. Throw away any unused medicine after the expiration date. NOTE: This sheet is a summary. It may not cover all possible information. If you have questions about this medicine, talk to your doctor, pharmacist, or health care provider.  2018 Elsevier/Gold Standard (2013-04-03 14:12:02)  

## 2017-10-12 ENCOUNTER — Ambulatory Visit (INDEPENDENT_AMBULATORY_CARE_PROVIDER_SITE_OTHER): Payer: PRIVATE HEALTH INSURANCE | Admitting: Diagnostic Neuroimaging

## 2017-10-12 ENCOUNTER — Encounter: Payer: Self-pay | Admitting: Diagnostic Neuroimaging

## 2017-10-12 VITALS — BP 155/101 | HR 71 | Ht 65.0 in | Wt 212.8 lb

## 2017-10-12 DIAGNOSIS — G43009 Migraine without aura, not intractable, without status migrainosus: Secondary | ICD-10-CM

## 2017-10-12 DIAGNOSIS — G4489 Other headache syndrome: Secondary | ICD-10-CM

## 2017-10-12 NOTE — Progress Notes (Signed)
[facility]  PATIENT: Barbara Myers DOB: Feb 11, 1976  REFERRING CLINICIAN: Dara Hoyer, MD HISTORY FROM: patient and chart review  REASON FOR VISIT: new consult    HISTORICAL  CHIEF COMPLAINT:  Chief Complaint  Patient presents with  . NP IR  Abelino Derrick, MD  . Arachnoid Cyst    Had headache, ED had CT found arachnoid cyst.  Bp elevated, did not take coreg this am.     HISTORY OF PRESENT ILLNESS:   42 year old female here for evaluation of headaches.  Patient has had history of migraine headaches in college.  She describes severe global headaches with nausea, photophobia, requiring her to lay down and go to sleep.  These would happen 1 or 2 times per year.  She did not get an official diagnosis for this.  She did not take any prescription medications for this.  About 1 month ago patient had onset of severe left frontal headache associated with blurry vision, nausea, photophobia, neck pain, popping sensation in her head.  Patient was scared about this headache and therefore went to the emergency room.  Her blood pressure was significantly elevated.  She had been under increased stress.  Patient was evaluated, had CT scan of the head which was unremarkable, and was sent home.  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  Headaches lasted approximately 7 days in a row and have resolved.  Patient reports family history of a sister who died of "double brain aneurysm" which ruptured.  Patient was concerned about similar diagnosis for herself.    REVIEW OF SYSTEMS: Full 14 system review of systems performed and negative with exception of: Headache snoring anemia enlarged lymph nodes constipation fatigue.  ALLERGIES: Allergies  Allergen Reactions  . Tape     HOME MEDICATIONS: Outpatient Medications Prior to Visit  Medication Sig Dispense Refill  . carvedilol (COREG) 12.5 MG tablet Take 1 tablet (12.5 mg total) by mouth 2 (two) times  daily. 180 tablet 0  . hydrochlorothiazide (HYDRODIURIL) 25 MG tablet Take 1 tablet (25 mg total) by mouth daily. 30 tablet 9  . ibuprofen (ADVIL,MOTRIN) 200 MG tablet Take 200 mg by mouth every 6 (six) hours as needed for headache.    . naproxen (NAPROSYN) 500 MG tablet Take 500 mg by mouth 2 (two) times daily with a meal.  3  . tamoxifen (NOLVADEX) 20 MG tablet TAKE 1 TABLET BY MOUTH EVERY DAY 90 tablet 3   No facility-administered medications prior to visit.     PAST MEDICAL HISTORY: Past Medical History:  Diagnosis Date  . Anemia   . Anxiety   . Breast cancer of upper-outer quadrant of right female breast ([place]) 07/24/2014  . Hypertension    bp elevated during chemo, not before  . Wears glasses     PAST SURGICAL HISTORY: Past Surgical History:  Procedure Laterality Date  . BREAST LUMPECTOMY WITH AXILLARY LYMPH NODE DISSECTION Right 02/08/2015   Procedure: RIGHT AXILLARY LYMPH NODE DISECTION, RIGHT RE-EXCISION OF MARGIN;  Surgeon: Rolm Bookbinder, MD;  Location: [facility];  Service: General;  Laterality: Right;  . PORT-A-CATH REMOVAL Left 02/08/2015   Procedure: REMOVAL PORT-A-CATH;  Surgeon: Rolm Bookbinder, MD;  Location: [facility];  Service: General;  Laterality: Left;  . PORTACATH PLACEMENT N/A 08/08/2014   Procedure: INSERTION PORT-A-CATH left subclavian;  Surgeon: Rolm Bookbinder, MD;  Location: [facility];  Service: General;  Laterality: N/A;  . RADIOACTIVE SEED GUIDED PARTIAL  MASTECTOMY WITH AXILLARY SENTINEL LYMPH NODE BIOPSY Right 01/16/2015   Procedure: RADIOACTIVE SEED GUIDED PARTIAL MASTECTOMY WITH RIGHT RADIOACTIVE SEED GUIDED AXILLARY SENTINEL LYMPH NODE BIOPSY;  Surgeon: Rolm Bookbinder, MD;  Location: [facility];  Service: General;  Laterality: Right;  . [place]  . WISDOM TOOTH EXTRACTION      FAMILY HISTORY: Family History  Problem Relation Age of Onset  . Hypertension  Mother   . Stroke Mother   . Breast cancer Maternal Grandmother        dx in her 58s  . Lung cancer Maternal Aunt        dx in her 78s; smoker  . Breast cancer Maternal Aunt   . Prostate cancer Paternal Grandfather     SOCIAL HISTORY:  Social History   Socioeconomic History  . Marital status: Married    Spouse name: Not on file  . Number of children: 2  . Years of education: Not on file  . Highest education level: Not on file  Social Needs  . Financial resource strain: Not on file  . Food insecurity - worry: Not on file  . Food insecurity - inability: Not on file  . Transportation needs - medical: Not on file  . Transportation needs - non-medical: Not on file  Occupational History  . Not on file  Tobacco Use  . Smoking status: Former Smoker    Packs/day: 0.01    Types: Cigarettes    Last attempt to quit: 07/31/1994    Years since quitting: 23.2  . Smokeless tobacco: Never Used  Substance and Sexual Activity  . Alcohol use: Yes    Alcohol/week: 0.0 oz    Comment: ocassionally  . Drug use: No  . Sexual activity: Yes  Other Topics Concern  . Not on file  Social History Narrative   Lives at home with husband and 2 children.  Education: some college.  Caffeine every day 10 oz)     PHYSICAL EXAM  GENERAL EXAM/CONSTITUTIONAL: Vitals:  Vitals:   10/12/17 0944  BP: (!) 155/101  Pulse: 71  Weight: 212 lb 12.8 oz (96.5 kg)  Height: 5\' 5"  (1.651 m)     Body mass index is 35.41 kg/m.  Visual Acuity Screening   Right eye Left eye Both eyes  Without correction:     With correction: 20/30 20/20      Patient is in no distress; well developed, nourished and groomed; neck is supple  CARDIOVASCULAR:  Examination of carotid arteries is normal; no carotid bruits  Regular rate and rhythm, no murmurs  Examination of peripheral vascular system by observation and palpation is normal  EYES:  Ophthalmoscopic exam of optic discs and posterior segments is normal;  no papilledema or hemorrhages  MUSCULOSKELETAL:  Gait, strength, tone, movements noted in Neurologic exam below  NEUROLOGIC: MENTAL STATUS:  No flowsheet data found.  awake, alert, oriented to person, place and time  recent and remote memory intact  normal attention and concentration  language fluent, comprehension intact, naming intact,   fund of knowledge appropriate  CRANIAL NERVE:   2nd - no papilledema on fundoscopic exam  2nd, 3rd, 4th, 6th - pupils equal and reactive to light, visual fields full to confrontation, extraocular muscles intact, no nystagmus  5th - facial sensation symmetric  7th - facial strength symmetric  8th - hearing intact  9th - palate elevates symmetrically, uvula midline  11th - shoulder shrug symmetric  12th - tongue  protrusion midline  MOTOR:   normal bulk and tone, full strength in the BUE, BLE  SENSORY:   normal and symmetric to light touch, temperature, vibration  COORDINATION:   finger-nose-finger, fine finger movements normal  REFLEXES:   deep tendon reflexes TRACE IN BUE; ABSENT IN BLE  GAIT/STATION:   narrow based gait    DIAGNOSTIC DATA (LABS, IMAGING, TESTING) - I reviewed patient records, labs, notes, testing and imaging myself where available.  Lab Results  Component Value Date   WBC 3.9 05/11/2015   HGB 11.1 (L) 05/11/2015   HCT 34.3 (L) 05/11/2015   MCV 87.7 05/11/2015   PLT 203 05/11/2015      Component Value Date/Time   NA 141 05/11/2015 1352   K 3.8 05/11/2015 1352   CL 105 02/09/2015 0720   CO2 29 05/11/2015 1352   GLUCOSE 94 05/11/2015 1352   BUN 8.3 05/11/2015 1352   CREATININE 0.79 08/24/2015 0740   CREATININE 0.8 05/11/2015 1352   CALCIUM 8.8 05/11/2015 1352   PROT 6.4 05/11/2015 1352   ALBUMIN 3.7 05/11/2015 1352   AST 20 05/11/2015 1352   ALT 24 05/11/2015 1352   ALKPHOS 80 05/11/2015 1352   BILITOT 0.38 05/11/2015 1352   GFRNONAA >60 08/24/2015 0740   GFRAA >60 08/24/2015  0740   No results found for: CHOL, HDL, LDLCALC, LDLDIRECT, TRIG, CHOLHDL No results found for: HGBA1C No results found for: VITAMINB12 Lab Results  Component Value Date   TSH 1.458 05/01/2015    09/28/17 CT head [I reviewed images myself and agree with interpretation. Likely incidental arachnoid cyst. -VRP]  1.  No evidence for acute  abnormality. 2. 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     ASSESSMENT AND PLAN  42 y.o. year old female here with history of migraine without aura since college, now with worsening severe headache 1 month ago with increased severity and change in quality.  Neurologic examination and CT scan of the head are unremarkable.  Patient has family history of fatal ruptured cerebral aneurysm in her sister.  We will proceed with further workup.   Ddx: migraine vs other secondary headache  1. Migraine without aura and without status migrainosus, not intractable   2. Other headache syndrome      PLAN:  SEVERE HEADACHE (change in severity; fam hx of aneurysm) - check MRI brain / MRA head  MIGRAINE WITHOUT AURA - ibuprofen / tylenol as needed for migraine HA - To prevent or relieve headaches, try the following:  Cool Compress. Lie down and place a cool compress on your head.   Avoid headache triggers. If certain foods or odors seem to have triggered your migraines in the past, avoid them. A headache diary might help you identify triggers.   Include physical activity in your daily routine.   Manage stress. Find healthy ways to cope with the stressors, such as delegating tasks on your to-do list.   Practice relaxation techniques. Try deep breathing, yoga, massage and visualization.   Eat regularly. Eating regularly scheduled meals and maintaining a healthy diet might help prevent headaches. Also, drink plenty of fluids.   Follow a regular sleep schedule. Sleep deprivation might contribute to headaches  Consider  biofeedback. With this mind-body technique, you learn to control certain bodily functions - such as muscle tension, heart rate and blood pressure - to prevent headaches or reduce headache pain.  ARACHNOID CYST  - incidental finding; benign; no further treatment necessary  Orders Placed This  Encounter  Procedures  . MR BRAIN W WO CONTRAST  . MR MRA HEAD WO CONTRAST   Return in about 6 months (around 04/14/2018).    Penni Bombard, MD 11/18/9690, 49:32 AM Certified in Neurology, Neurophysiology and Neuroimaging  [facility] [address] [place], [place] [address] [phone]

## 2017-10-12 NOTE — Patient Instructions (Addendum)
  SEVERE HEADACHE (change in severity; fam hx of aneurysm) - check MRI brain / MRA head  MIGRAINE WITHOUT AURA - ibuprofen / tylenol as needed for migraine HA - To prevent or relieve headaches, try the following:  Cool Compress. Lie down and place a cool compress on your head.   Avoid headache triggers. If certain foods or odors seem to have triggered your migraines in the past, avoid them. A headache diary might help you identify triggers.   Include physical activity in your daily routine.   Manage stress. Find healthy ways to cope with the stressors, such as delegating tasks on your to-do list.   Practice relaxation techniques. Try deep breathing, yoga, massage and visualization.   Eat regularly. Eating regularly scheduled meals and maintaining a healthy diet might help prevent headaches. Also, drink plenty of fluids.   Follow a regular sleep schedule. Sleep deprivation might contribute to headaches  Consider biofeedback. With this mind-body technique, you learn to control certain bodily functions - such as muscle tension, heart rate and blood pressure - to prevent headaches or reduce headache pain.  ARACHNOID CYST  - incidental finding; no further treatment necessary

## 2017-10-16 ENCOUNTER — Encounter: Payer: Self-pay | Admitting: Diagnostic Neuroimaging

## 2017-10-25 ENCOUNTER — Ambulatory Visit
Admission: RE | Admit: 2017-10-25 | Discharge: 2017-10-25 | Disposition: A | Discharge status: Home / Self Care | Payer: PRIVATE HEALTH INSURANCE | Source: Ambulatory Visit | Attending: Diagnostic Neuroimaging | Admitting: Diagnostic Neuroimaging

## 2017-10-25 DIAGNOSIS — R51 Headache: Secondary | ICD-10-CM

## 2017-10-25 DIAGNOSIS — G4489 Other headache syndrome: Secondary | ICD-10-CM

## 2017-10-25 MED ORDER — GADOBENATE DIMEGLUMINE 529 MG/ML IV SOLN
20.0000 mL | Freq: Once | INTRAVENOUS | Status: AC | PRN
  Administered 2017-10-25: 20 mL via INTRAVENOUS

## 2017-11-05 ENCOUNTER — Telehealth: Payer: Self-pay | Admitting: *Deleted

## 2017-11-05 NOTE — Telephone Encounter (Signed)
Spoke with patient and informed her that her MRI brain and MRA head results are unremarkable. She then stated she had sent a my chart message and was asking about the reply. This RN read to her Lovey Newcomer, RN's reply on 10/25/17 which the patient had not responded to. Patient stated she still has episodes of feeling light headed when laying down. This happens when she first wakes up. She denies headaches. This occurs once every couple days, and she feels like she's going to pass out. This RN discussed possible dehydration and low blood sugar first thing in the morning. She does not use OTC allergy or sleep aids. She takes HCTZ. She stated she will keep record of her water intake and keep juice beside her bed to monitor these occurences. She will call back if she continues to have these episodes. She verbalized understanding, appreciation of call.

## 2018-01-05 ENCOUNTER — Ambulatory Visit: Payer: PRIVATE HEALTH INSURANCE | Admitting: Family Medicine

## 2018-01-18 ENCOUNTER — Ambulatory Visit: Payer: PRIVATE HEALTH INSURANCE | Admitting: Family Medicine

## 2018-01-26 ENCOUNTER — Other Ambulatory Visit: Payer: Self-pay

## 2018-01-26 ENCOUNTER — Emergency Department (HOSPITAL_COMMUNITY)
Admission: EM | Admit: 2018-01-26 | Discharge: 2018-01-27 | Disposition: A | Discharge status: Home / Self Care | Payer: Medicaid Other | Attending: Emergency Medicine | Admitting: Emergency Medicine

## 2018-01-26 ENCOUNTER — Encounter (HOSPITAL_COMMUNITY): Payer: Self-pay | Admitting: Emergency Medicine

## 2018-01-26 ENCOUNTER — Ambulatory Visit (INDEPENDENT_AMBULATORY_CARE_PROVIDER_SITE_OTHER): Payer: PRIVATE HEALTH INSURANCE | Admitting: Family Medicine

## 2018-01-26 ENCOUNTER — Encounter: Payer: Self-pay | Admitting: Family Medicine

## 2018-01-26 VITALS — BP 128/80 | HR 64 | Temp 98.0°F | Ht 65.0 in | Wt 218.1 lb

## 2018-01-26 DIAGNOSIS — R55 Syncope and collapse: Secondary | ICD-10-CM | POA: Diagnosis present

## 2018-01-26 DIAGNOSIS — Z79899 Other long term (current) drug therapy: Secondary | ICD-10-CM | POA: Insufficient documentation

## 2018-01-26 DIAGNOSIS — Z0001 Encounter for general adult medical examination with abnormal findings: Secondary | ICD-10-CM

## 2018-01-26 DIAGNOSIS — I427 Cardiomyopathy due to drug and external agent: Secondary | ICD-10-CM

## 2018-01-26 DIAGNOSIS — D649 Anemia, unspecified: Secondary | ICD-10-CM | POA: Insufficient documentation

## 2018-01-26 DIAGNOSIS — F439 Reaction to severe stress, unspecified: Secondary | ICD-10-CM

## 2018-01-26 DIAGNOSIS — D500 Iron deficiency anemia secondary to blood loss (chronic): Secondary | ICD-10-CM

## 2018-01-26 DIAGNOSIS — T451X5A Adverse effect of antineoplastic and immunosuppressive drugs, initial encounter: Secondary | ICD-10-CM | POA: Diagnosis not present

## 2018-01-26 DIAGNOSIS — R42 Dizziness and giddiness: Secondary | ICD-10-CM

## 2018-01-26 DIAGNOSIS — R635 Abnormal weight gain: Secondary | ICD-10-CM | POA: Insufficient documentation

## 2018-01-26 DIAGNOSIS — I10 Essential (primary) hypertension: Secondary | ICD-10-CM | POA: Insufficient documentation

## 2018-01-26 DIAGNOSIS — Z87891 Personal history of nicotine dependence: Secondary | ICD-10-CM | POA: Diagnosis not present

## 2018-01-26 LAB — CBC
HCT: 24.9 % — ABNORMAL LOW (ref 36.0–46.0)
HCT: 25.2 % — ABNORMAL LOW (ref 36.0–46.0)
HEMOGLOBIN: 7.5 g/dL — AB (ref 12.0–15.0)
Hemoglobin: 7.7 g/dL — CL (ref 12.0–15.0)
MCH: 20.9 pg — AB (ref 26.0–34.0)
MCHC: 30.8 g/dL (ref 30.0–36.0)
MCHC: 29.8 g/dL — AB (ref 30.0–36.0)
MCV: 67.7 fl — ABNORMAL LOW (ref 78.0–100.0)
MCV: 70.2 fL — ABNORMAL LOW (ref 78.0–100.0)
PLATELETS: 370 10*3/uL (ref 150–400)
Platelets: 315 10*3/uL (ref 150.0–400.0)
RBC: 3.68 Mil/uL — ABNORMAL LOW (ref 3.87–5.11)
RBC: 3.59 MIL/uL — AB (ref 3.87–5.11)
RDW: 20.7 % — AB (ref 11.5–15.5)
RDW: 20 % — ABNORMAL HIGH (ref 11.5–15.5)
WBC: 5.3 10*3/uL (ref 4.0–10.5)
WBC: 7.1 10*3/uL (ref 4.0–10.5)

## 2018-01-26 LAB — BASIC METABOLIC PANEL
ANION GAP: 4 — AB (ref 5–15)
BUN: 13 mg/dL (ref 6–20)
CO2: 28 mmol/L (ref 22–32)
CREATININE: 0.92 mg/dL (ref 0.44–1.00)
Calcium: 8.5 mg/dL — ABNORMAL LOW (ref 8.9–10.3)
Chloride: 108 mmol/L (ref 101–111)
Glucose, Bld: 106 mg/dL — ABNORMAL HIGH (ref 65–99)
Potassium: 3.4 mmol/L — ABNORMAL LOW (ref 3.5–5.1)
Sodium: 140 mmol/L (ref 135–145)

## 2018-01-26 LAB — DIFFERENTIAL
BASOS PCT: 0 %
Basophils Absolute: 0 10*3/uL (ref 0.0–0.1)
EOS ABS: 0.1 10*3/uL (ref 0.0–0.7)
Eosinophils Relative: 2 %
Lymphocytes Relative: 35 %
Lymphs Abs: 2.5 10*3/uL (ref 0.7–4.0)
MONOS PCT: 7 %
Monocytes Absolute: 0.5 10*3/uL (ref 0.1–1.0)
Neutro Abs: 3.9 10*3/uL (ref 1.7–7.7)
Neutrophils Relative %: 56 %

## 2018-01-26 LAB — I-STAT BETA HCG BLOOD, ED (MC, WL, AP ONLY)

## 2018-01-26 LAB — URINALYSIS, ROUTINE W REFLEX MICROSCOPIC
Bilirubin Urine: NEGATIVE
Hgb urine dipstick: NEGATIVE
Ketones, ur: NEGATIVE
Leukocytes, UA: NEGATIVE
Nitrite: NEGATIVE
PH: 6.5 (ref 5.0–8.0)
RBC / HPF: NONE SEEN (ref 0–?)
SPECIFIC GRAVITY, URINE: 1.02 (ref 1.000–1.030)
Total Protein, Urine: NEGATIVE
Urine Glucose: NEGATIVE
Urobilinogen, UA: 0.2 (ref 0.0–1.0)

## 2018-01-26 LAB — CBG MONITORING, ED: Glucose-Capillary: 101 mg/dL — ABNORMAL HIGH (ref 65–99)

## 2018-01-26 LAB — COMPREHENSIVE METABOLIC PANEL
ALK PHOS: 43 U/L (ref 39–117)
ALT: 13 U/L (ref 0–35)
AST: 15 U/L (ref 0–37)
Albumin: 3.7 g/dL (ref 3.5–5.2)
BILIRUBIN TOTAL: 0.3 mg/dL (ref 0.2–1.2)
BUN: 12 mg/dL (ref 6–23)
CO2: 27 meq/L (ref 19–32)
CREATININE: 0.76 mg/dL (ref 0.40–1.20)
Calcium: 8.6 mg/dL (ref 8.4–10.5)
Chloride: 108 mEq/L (ref 96–112)
GFR: 106.15 mL/min (ref >60.00)
GLUCOSE: 99 mg/dL (ref 70–99)
Potassium: 3.8 mEq/L (ref 3.5–5.1)
Sodium: 142 mEq/L (ref 135–145)
TOTAL PROTEIN: 6.5 g/dL (ref 6.0–8.3)

## 2018-01-26 LAB — IRON,TIBC AND FERRITIN PANEL
%SAT: 5 % (calc) — ABNORMAL LOW (ref 16–45)
Ferritin: 4 ng/mL — ABNORMAL LOW (ref 16–232)
Iron: 24 ug/dL — ABNORMAL LOW (ref 40–190)
TIBC: 473 mcg/dL (calc) — ABNORMAL HIGH (ref 250–450)

## 2018-01-26 LAB — MICROALBUMIN / CREATININE URINE RATIO
Creatinine,U: 161.9 mg/dL
MICROALB UR: 1.5 mg/dL (ref 0.0–1.9)
MICROALB/CREAT RATIO: 0.9 mg/g (ref 0.0–30.0)

## 2018-01-26 LAB — LIPID PANEL
CHOL/HDL RATIO: 3
Cholesterol: 138 mg/dL (ref 0–200)
HDL: 49.2 mg/dL (ref >39.00)
LDL CALC: 78 mg/dL (ref 0–99)
NONHDL: 89.28
TRIGLYCERIDES: 56 mg/dL (ref 0.0–149.0)
VLDL: 11.2 mg/dL (ref 0.0–40.0)

## 2018-01-26 LAB — PROTIME-INR
INR: 0.93
Prothrombin Time: 12.3 seconds (ref 11.4–15.2)

## 2018-01-26 LAB — RETICULOCYTES
RBC.: 3.57 MIL/uL — AB (ref 3.87–5.11)
RETIC CT PCT: 1.6 % (ref 0.4–3.1)
Retic Count, Absolute: 57.1 10*3/uL (ref 19.0–186.0)

## 2018-01-26 LAB — TSH: TSH: 1.59 u[IU]/mL (ref 0.35–4.50)

## 2018-01-26 MED ORDER — SODIUM CHLORIDE 0.9 % IV SOLN
10.0000 mL/h | Freq: Once | INTRAVENOUS | Status: AC
  Administered 2018-01-27: 10 mL/h via INTRAVENOUS

## 2018-01-26 MED ORDER — CARVEDILOL 12.5 MG PO TABS: 12.5000 mg | ORAL_TABLET | Freq: Two times a day (BID) | ORAL | 0 refills | Status: DC

## 2018-01-26 MED ORDER — HYDROCHLOROTHIAZIDE 25 MG PO TABS: 25.0000 mg | ORAL_TABLET | Freq: Every day | ORAL | 0 refills | Status: AC

## 2018-01-26 NOTE — ED Provider Notes (Signed)
[facility] DEPT Provider Note   CSN: [id number] Arrival date & time: 01/26/18  1836     History   Chief Complaint Chief Complaint  Patient presents with  . Abnormal Lab    HPI Barbara Myers is a 42 y.o. female.  42 year old female with a history of right breast cancer (stage II, remission x 2 years), HTN, anxiety presents to the emergency department at the advice of her primary care doctor.  She was seen in the office today for routine follow-up for complaints of hypertension as well as episodic lightheadedness and near syncope.  She was called this afternoon and notified that her hemoglobin was 7.7.  On chart review, baseline hemoglobin appears to be between 10 and 11.  She denies any increased bleeding or bruising.  No hemoptysis, melena, hematochezia, hematemesis or hematuria.  She is not on chronic anticoagulation.  Reports a history of menorrhagia, but states that her menses have actually been lighter recently and more regular.  The patient has not had any chest pain or shortness of breath, fevers.  She has had consistent global headaches which have persisted since she was last seen in the ED in February.  Patient did not resume primary care follow-up until recently.  Last consistent PCP visits were in 2016.     Past Medical History:  Diagnosis Date  . Anemia   . Anxiety   . Breast cancer of upper-outer quadrant of right female breast ([place]) 07/24/2014  . Hypertension    bp elevated during chemo, not before  . Wears glasses     Patient Active Problem List   Diagnosis Date Noted  . Encounter for health maintenance examination with abnormal findings 01/26/2018  . Stress 01/26/2018  . Weight gain 01/26/2018  . Light headed 01/26/2018  . Essential hypertension 01/26/2018  . Arachnoid cyst 10/08/2017  . 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 Pruritus 12/15/2014  . Genetic testing 09/18/2014  . Iron deficiency anemia due to chronic blood loss 07/26/2014  . Breast cancer of upper-outer quadrant of right female breast ([place]) 07/24/2014    Past Surgical History:  Procedure Laterality Date  . BREAST LUMPECTOMY WITH AXILLARY LYMPH NODE DISSECTION Right 02/08/2015   Procedure: RIGHT AXILLARY LYMPH NODE DISECTION, RIGHT RE-EXCISION OF MARGIN;  Surgeon: Rolm Bookbinder, MD;  Location: [facility];  Service: General;  Laterality: Right;  . PORT-A-CATH REMOVAL Left 02/08/2015   Procedure: REMOVAL PORT-A-CATH;  Surgeon: Rolm Bookbinder, MD;  Location: [facility];  Service: General;  Laterality: Left;  . PORTACATH PLACEMENT N/A 08/08/2014   Procedure: INSERTION PORT-A-CATH left subclavian;  Surgeon: Rolm Bookbinder, MD;  Location: [facility];  Service: General;  Laterality: N/A;  . RADIOACTIVE SEED GUIDED PARTIAL MASTECTOMY WITH AXILLARY SENTINEL LYMPH NODE BIOPSY Right 01/16/2015   Procedure: RADIOACTIVE SEED GUIDED PARTIAL MASTECTOMY WITH RIGHT RADIOACTIVE SEED GUIDED AXILLARY SENTINEL LYMPH NODE BIOPSY;  Surgeon: Rolm Bookbinder, MD;  Location: [facility];  Service: General;  Laterality: Right;  . [place]  . WISDOM TOOTH EXTRACTION       OB History    Gravida  2   Para  2   Term  2   Preterm      AB      Living  2     SAB      TAB  Ectopic      Multiple      Live Births               Home Medications    Prior to Admission medications   Medication Sig Start Date End Date Taking? Authorizing Provider  carvedilol (COREG) 12.5 MG tablet Take 1 tablet (12.5 mg total) by mouth 2 (two) times daily. 01/26/18  Yes Libby Maw, MD  hydrochlorothiazide (HYDRODIURIL) 25 MG tablet Take 1 tablet (25 mg total) by mouth daily. 01/26/18  Yes Libby Maw, MD  ibuprofen (ADVIL,MOTRIN) 200  MG tablet Take 200 mg by mouth every 6 (six) hours as needed for headache.   Yes [provider]  tamoxifen (NOLVADEX) 20 MG tablet TAKE 1 TABLET BY MOUTH EVERY DAY 06/03/17  Yes Nicholas Lose, MD    Family History Family History  Problem Relation Age of Onset  . Hypertension Mother   . Stroke Mother   . Breast cancer Maternal Grandmother        dx in her 61s  . Lung cancer Maternal Aunt        dx in her 61s; smoker  . Breast cancer Maternal Aunt   . Prostate cancer Paternal Grandfather     Social History Social History   Tobacco Use  . Smoking status: Former Smoker    Packs/day: 0.01    Types: Cigarettes    Last attempt to quit: 07/31/1994    Years since quitting: 23.5  . Smokeless tobacco: Never Used  Substance Use Topics  . Alcohol use: Yes    Alcohol/week: 0.0 oz    Comment: ocassionally  . Drug use: No     Allergies   Tape   Review of Systems Review of Systems Ten systems reviewed and are negative for acute change, except as noted in the HPI.    Physical Exam Updated Vital Signs BP (!) 132/94   Pulse 60   Temp 98.1 F (36.7 C) (Oral)   Resp 13   SpO2 99%   Physical Exam  Constitutional: She is oriented to person, place, and time. She appears well-developed and well-nourished. No distress.  Nontoxic appearing and in NAD  HENT:  Head: Normocephalic and atraumatic.  Eyes: Conjunctivae and EOM are normal. No scleral icterus.  Neck: Normal range of motion.  Cardiovascular: Normal rate, regular rhythm and intact distal pulses.  Pulmonary/Chest: Effort normal. No stridor. No respiratory distress. She has no wheezes. She has no rales.  Lungs CTAB. Respirations even and unlabored.  Musculoskeletal: Normal range of motion.  Mild edema to bilateral ankles; non-pitting.  Neurological: She is alert and oriented to person, place, and time. She exhibits normal muscle tone. Coordination normal.  Skin: Skin is warm and dry. No rash noted. She is not  diaphoretic. No erythema. No pallor.  Psychiatric: She has a normal mood and affect. Her behavior is normal.  Nursing note and vitals reviewed.    ED Treatments / Results  Labs (all labs ordered are listed, but only abnormal results are displayed) Labs Reviewed  BASIC METABOLIC PANEL - Abnormal; Notable for the following components:      Result Value   Potassium 3.4 (*)    Glucose, Bld 106 (*)    Calcium 8.5 (*)    Anion gap 4 (*)    All other components within normal limits  CBC - Abnormal; Notable for the following components:   RBC 3.59 (*)    Hemoglobin 7.5 (*)    HCT 25.2 (*)  MCV 70.2 (*)    MCH 20.9 (*)    MCHC 29.8 (*)    RDW 20.0 (*)    All other components within normal limits  RETICULOCYTES - Abnormal; Notable for the following components:   RBC. 3.57 (*)    All other components within normal limits  IRON AND TIBC - Abnormal; Notable for the following components:   Iron 18 (*)    TIBC 472 (*)    Saturation Ratios 4 (*)    All other components within normal limits  FERRITIN - Abnormal; Notable for the following components:   Ferritin 3 (*)    All other components within normal limits  CBG MONITORING, ED - Abnormal; Notable for the following components:   Glucose-Capillary 101 (*)    All other components within normal limits  URINALYSIS, ROUTINE W REFLEX MICROSCOPIC  PROTIME-INR  DIFFERENTIAL  VITAMIN B12  FOLATE  I-STAT BETA HCG BLOOD, ED ([facility], [facility], [facility] ONLY)  PREPARE RBC (CROSSMATCH)  TYPE AND SCREEN  ABO/RH    EKG None  Radiology No results found.  Procedures Procedures (including critical care time)  Medications Ordered in ED Medications  0.9 %  sodium chloride infusion (has no administration in time range)    CRITICAL CARE Performed by: Antonietta Breach   Total critical care time: 45 minutes  Critical care time was exclusive of separately billable procedures and treating other patients.  Critical care was necessary to treat or prevent  imminent or life-threatening deterioration.  Critical care was time spent personally by me on the following activities: development of treatment plan with patient and/or surrogate as well as nursing, discussions with consultants, evaluation of patient's response to treatment, examination of patient, obtaining history from patient or surrogate, ordering and performing treatments and interventions, ordering and review of laboratory studies, ordering and review of radiographic studies, pulse oximetry and re-evaluation of patient's condition.   Initial Impression / Assessment and Plan / ED Course  I have reviewed the triage vital signs and the nursing notes.  Pertinent labs & imaging results that were available during my care of the patient were reviewed by me and considered in my medical decision making (see chart for details).     42 year old female presents to the emergency department at the advice of her primary care doctor after having routine outpatient blood work which showed hemoglobin of 7.7.  Anemia confirmed on ED blood work.  This is favored to be chronic given low MCV.  Patient denies any hematemesis, melena, hematochezia.  She had a normal BUN today which supports unlikely GI source.  Also no hematuria.  The patient is not on blood thinners.  She denies bruising sensitivity.  Given complaints of intermittent lightheadedness (stable orthostatics) with persistent headaches since February, some mild dyspnea on exertion without hypoxia, the patient was transfused with 2 units PRBCs in the ED.  Anemia panel ordered which can be followed by the patient's primary doctor on an outpatient basis.  The patient has a history of breast cancer for which she was followed by hematology/oncology, Dr. Lindi Adie.  I have recommended that the patient follow-up with Dr. Lindi Adie regarding her anemia as well.  Patient awaiting completion of second unit PRBCs.  Patient comfortable with plan for continued outpatient  management following completion of this transfusion.  Plan for discharge on an iron tablet.  Patient signed out to Domenic Moras, PA-C at shift change who will disposition appropriately.   Vitals:   01/27/18 0401 01/27/18 0430 01/27/18 0500 01/27/18 0530  BP: (!) 147/91 (!) 143/86 138/85 (!) 132/94  Pulse: 64 71 63 60  Resp: 15 13 13 13   Temp: 98.1 F (36.7 C)     TempSrc: Oral     SpO2: 100% 99% 99% 99%    Final Clinical Impressions(s) / ED Diagnoses   Final diagnoses:  Symptomatic anemia    ED Discharge Orders    None       Antonietta Breach, PA-C 01/27/18 0542    Valarie Merino, MD 01/29/18 1152

## 2018-01-26 NOTE — Progress Notes (Addendum)
Subjective:  Patient ID: Barbara Myers, female    DOB: 07/28/1976  Age: 42 y.o. MRN: [id number]  CC: Follow-up   HPI Barbara Myers presents for follow-up of her hypertension and other medical issues.  She also would like blood work checked today.  She tells me that her blood pressure has been a little elevated at home.  She did not bring in a list today.  She also has had a couple of spells of lightheadedness.  She has not fainted but she had abruptly sit down.  A nurse at the neurologist office suggested that it might be diabetes she has no family history of diabetes no history of gestational diabetes.  She does have a history of iron deficiency anemia.  She is fasting today for her blood work.  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  Last echocardiogram in 2016 showed an ejection fraction of  to 55%.  She also welled up when she talked about the weight gain she has experienced since her breast cancer diagnosis treatment and cure.  She works 10-hour days and cannot seem to find time to exercise.  She does have a family history of hypothyroidism.  She will schedule a Pap smear with her GYN provider..  She rarely drinks alcohol and does not use street drugs.  She does not smoke cigarettes.  Outpatient Medications Prior to Visit  Medication Sig Dispense Refill  . ibuprofen (ADVIL,MOTRIN) 200 MG tablet Take 200 mg by mouth every 6 (six) hours as needed for headache.    . tamoxifen (NOLVADEX) 20 MG tablet TAKE 1 TABLET BY MOUTH EVERY DAY 90 tablet 3  . carvedilol (COREG) 12.5 MG tablet Take 1 tablet (12.5 mg total) by mouth 2 (two) times daily. 180 tablet 0  . hydrochlorothiazide (HYDRODIURIL) 25 MG tablet Take 1 tablet (25 mg total) by mouth daily. 30 tablet 9  . naproxen (NAPROSYN) 500 MG tablet Take 500 mg by mouth 2 (two) times daily with a meal. As needed  3   No facility-administered medications prior to visit.     ROS Review of Systems  Constitutional:  Negative for chills, fatigue, fever and unexpected weight change.  HENT: Negative.   Eyes: Negative for photophobia and visual disturbance.  Respiratory: Negative.   Cardiovascular: Negative.   Gastrointestinal: Negative.   Endocrine: Negative for cold intolerance, heat intolerance, polyphagia and polyuria.  Genitourinary: Negative.   Musculoskeletal: Negative for gait problem and joint swelling.  Skin: Negative for pallor.  Allergic/Immunologic: Negative for immunocompromised state.  Neurological: Positive for light-headedness. Negative for dizziness, weakness and headaches.  Hematological: Does not bruise/bleed easily.  Psychiatric/Behavioral: Positive for dysphoric mood.    Objective:  BP 128/80   Pulse 64   Temp 98 F (36.7 C)   Ht 5\' 5"  (1.651 m)   Wt 218 lb 2 oz (98.9 kg)   SpO2 98%   BMI 36.30 kg/m   BP Readings from Last 3 Encounters:  01/27/18 (!) 156/96  01/26/18 128/80  10/12/17 (!) 155/101    Wt Readings from Last 3 Encounters:  01/26/18 218 lb 2 oz (98.9 kg)  10/12/17 212 lb 12.8 oz (96.5 kg)  10/08/17 213 lb (96.6 kg)    Physical Exam  Constitutional: She is oriented to person, place, and time. She appears well-developed and well-nourished. No distress.  HENT:  Head: Normocephalic and atraumatic.  Right Ear: External ear normal.  Left Ear: External ear normal.  Mouth/Throat: Oropharynx is clear and moist. No  oropharyngeal exudate.  Eyes: Pupils are equal, round, and reactive to light. Conjunctivae and EOM are normal. Right eye exhibits no discharge. Left eye exhibits no discharge. No scleral icterus.  Neck: No JVD present. No tracheal deviation present. No thyromegaly present.  Cardiovascular: Normal rate, regular rhythm and normal heart sounds.  Pulmonary/Chest: Effort normal and breath sounds normal.  Abdominal: Bowel sounds are normal.  Lymphadenopathy:    She has no cervical adenopathy.  Neurological: She is alert and oriented to person, place,  and time.  Skin: Skin is warm and dry. She is not diaphoretic.  Psychiatric: She has a normal mood and affect. Her behavior is normal.   Depression screen [facility] 2/9 01/26/2018 10/08/2017  Decreased Interest 2 0  Down, Depressed, Hopeless 0 0  PHQ - 2 Score 2 0  Altered sleeping 1 -  Tired, decreased energy 2 -  Change in appetite 1 -  Feeling bad or failure about yourself  0 -  Trouble concentrating 0 -  Moving slowly or fidgety/restless 0 -  Suicidal thoughts 0 -  PHQ-9 Score 6 -    Lab Results  Component Value Date   WBC 7.1 01/26/2018   HGB 7.5 (L) 01/26/2018   HCT 25.2 (L) 01/26/2018   PLT 370 01/26/2018   GLUCOSE 106 (H) 01/26/2018   CHOL 138 01/26/2018   TRIG 56.0 01/26/2018   HDL 49.20 01/26/2018   LDLCALC 78 01/26/2018   ALT 13 01/26/2018   AST 15 01/26/2018   NA 140 01/26/2018   K 3.4 (L) 01/26/2018   CL 108 01/26/2018   CREATININE 0.92 01/26/2018   BUN 13 01/26/2018   CO2 28 01/26/2018   TSH 1.59 01/26/2018   INR 0.93 01/26/2018   MICROALBUR 1.5 01/26/2018    Mr Jodene Nam Head Wo Contrast  Result Date: 10/25/2017  [facility] [address] [place], [place] [address] [phone] NEUROIMAGING REPORT STUDY DATE: 10/25/2017 PATIENT NAME: Barbara Myers DOB: 06-09-1976 MRN: [id number] ORDERING CLINICIAN: Dr Leta Baptist CLINICAL HISTORY: 65 year patient with refractory headaches COMPARISON FILMS: none EXAM: MRA Brain wo TECHNIQUE: MR angiogram of the head was obtained utilizing 3D time of flight sequences from below the vertebrobasilar junction up to the intracranial vasculature without contrast.  Computerized reconstructions were obtained. CONTRAST: none IMAGING SITE: [facility] FINDINGS: This study is of adequate technical quality. Flow signal of the bilateral internal carotid arteries have no stenosis. The bilateral middle and anterior cerebral arteries have no stenosis. The bilateral vertebral, basilar, bilateral posterior cerebral arteries have  no stenosis.  The right posterior cerebral artery is codominant but the left posterior cerebral artery has a persistent fetal pattern of origin.  There is a 2 mm dilatation at the junction of the anterior communicating artery with the left A2 segment of the anterior cerebral artery which may represent infundibulum versus small aneurysm.  No aneurysmal dilatations are seen.   Unremarkable MRA of the brain showing no significant stenosis or narrowing of the large and medium size intracranial vessels.  A 2 mm dilatation at the junction of the anterior communicating artery with the left anterior cerebral artery may represent infundibulum or tiny aneurysm. INTERPRETING PHYSICIAN: Antony Contras, MD Certified in  Neuroimaging by [place] [employer] and [employer]   Mr Jeri Cos Wo Contrast  Result Date: 10/25/2017  [facility] [address], [place], [place] [address] [phone] NEUROIMAGING REPORT STUDY DATE: 10/25/2017 PATIENT NAME: Salley Boxley DOB: Oct 17, 1975 MRN: [id number]  ORDERING CLINICIAN: Dr Leta Baptist CLINICAL HISTORY: 47 year patient with headaches COMPARISON FILMS: CT Head 09/28/2017 EXAM: MRI Brain w/wo TECHNIQUE: MRI of the brain with and without contrast was obtained utilizing 5 mm axial slices with T1, T2, T2 flair, T2 star gradient echo and diffusion weighted views.  T1 sagittal, T2 coronal and postcontrast views in the axial and coronal plane were obtained. CONTRAST: 20 ml iv magnevist IMAGING SITE:[facility] FINDINGS: The brain parenchyma shows no abnormal signal intensities.  No structural lesion, tumor or infarcts are noted.  The paranasal sinuses show mild chronic inflammatory changes.  There is a CSF density collection posterior to the cerebellum which may represent benign arachnoid cyst versus dilated cisterna magna.No abnormal lesions are seen on diffusion-weighted views to suggest acute ischemia. The cortical  sulci, fissures and cisterns are normal in size and appearance. Lateral, third and fourth ventricle are normal in size and appearance. No extra-axial fluid collections are seen. No evidence of mass effect or midline shift.  No abnormal lesions are seen on post contrast views.  On sagittal views the posterior fossa, pituitary gland and corpus callosum are unremarkable. No evidence of intracranial hemorrhage on gradient-echo views. The orbits and their contents, paranasal sinuses and calvarium are unremarkable.  Intracranial flow voids are present.  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   Unremarkable MRI scan of the brain with and without contrast.  Incidental changes of mild chronic paranasal sinusitis, posterior fossa arachnoid cyst versus dilated cisterna magna and diminutive flow voids of posterior circulation vessels are noted INTERPRETING PHYSICIAN: PRAMOD SETHI, MD Certified in  Neuroimaging by [place] [employer] and [employer]    Assessment & Plan:   Shamirah was seen today for follow-up.  Diagnoses and all orders for this visit:  Chemotherapy induced cardiomyopathy ([place]) -     CBC -     carvedilol (COREG) 12.5 MG tablet; Take 1 tablet (12.5 mg total) by mouth 2 (two) times daily.  Iron deficiency anemia due to chronic blood loss -     CBC -     Iron, TIBC and Ferritin Panel -     TSH -     ferrous sulfate 325 (65 FE) MG EC tablet; Take 1 tablet (325 mg total) by mouth 2 (two) times daily with a meal.  Essential hypertension -     CBC -     Comprehensive metabolic panel -     Urinalysis, Routine w reflex microscopic -     Microalbumin / creatinine urine ratio -     hydrochlorothiazide (HYDRODIURIL) 25 MG tablet; Take 1 tablet (25 mg total) by mouth daily. -     carvedilol (COREG) 12.5 MG tablet; Take 1 tablet (12.5 mg total) by mouth 2 (two) times daily.  Light headed -      CBC -     Comprehensive metabolic panel  Weight gain -     Amb ref to Medical Nutrition Therapy-MNT -     Ambulatory referral to Psychology  Encounter for health maintenance examination with abnormal findings -     CBC -     Lipid panel  Stress -     Ambulatory referral to Psychology   I am having Elaina Pattee start on ferrous sulfate. I am also having her maintain her tamoxifen, ibuprofen, hydrochlorothiazide, and carvedilol.  Meds ordered this encounter  Medications  . hydrochlorothiazide (HYDRODIURIL) 25 MG tablet    Sig: Take 1 tablet (  25 mg total) by mouth daily.    Dispense:  100 tablet    Refill:  0  . carvedilol (COREG) 12.5 MG tablet    Sig: Take 1 tablet (12.5 mg total) by mouth 2 (two) times daily.    Dispense:  180 tablet    Refill:  0  . ferrous sulfate 325 (65 FE) MG EC tablet    Sig: Take 1 tablet (325 mg total) by mouth 2 (two) times daily with a meal.    Dispense:  180 tablet    Refill:  1   Asked her to follow-up in 2 months.  She will check and record her blood pressures at home.  She will bring her wrist cuff in in 2 months so we can compare it to our wall unit.  Have scheduled her for nutrition counseling and talking therapy.  There was a fair amount of resentment when I suggested that she was consuming more calories than she was expending.  I think there is significant denial there.  Thyroid function is pending.  She did score 6 over PHQ 9.  Believe that talking therapy is would be helpful for her  as well as nutrition counseling.  May need follow-up with cardiology for repeat echo.  Will discuss in 1 months.  Addendum: Hgb at critical value of 7.6. Called and left a message on patient's voicemail to return our call at 11:38.  Addendum: Judson Roch called patient and left a message on her voicemail. She also sent a note to my chart at 1:14.  Follow-up: Return in about 1 month (around 02/25/2018).  Libby Maw, MD

## 2018-01-26 NOTE — Patient Instructions (Signed)
Adjustment Disorder, Adult Adjustment disorder is a group of symptoms that can develop after a stressful life event, such as the loss of a job or serious physical illness. The symptoms can affect how you feel, think, and act. They may interfere with your relationships. Adjustment disorder increases your risk of suicide and substance abuse. If this disorder is not managed early, it can develop into a more serious condition, such as major depressive disorder or post-traumatic stress disorder. What are the causes? This condition happens when you have trouble recovering from or coping with a stressful life event. What increases the risk? You are more likely to develop this condition if:  You have had depression or anxiety.  You are being treated for a long-term (chronic) illness.  You are being treated for an illness that cannot be cured (terminal illness).  You have a family history of mental illness.  What are the signs or symptoms? Symptoms of this condition include:  Extreme trouble doing daily tasks, such as going to work.  Sadness, depression, or crying spells.  Worrying a lot.  Loss of enjoyment.  Change in appetite or weight.  Feelings of loss or hopelessness.  Thoughts of suicide.  Anxiety, worry, or nervousness.  Trouble sleeping.  Avoiding family and friends.  Fighting or vandalism.  Complaining of feeling sick without being ill.  Feeling dazed or disconnected.  Nightmares.  Trouble sleeping.  Irritability.  Reckless driving.  Poor work Systems analyst.  Ignoring bills.  Symptoms of this condition start within three months of the stressful event. They do not last more than six months, unless the stressful circumstances last longer. Normal grieving after the death of a loved one is not a symptom of this condition. How is this diagnosed? To diagnose this condition, your health care provider will ask about what has happened in your life and how it has  affected you. He or she may also ask about your medical history and your use of medicines, alcohol, and other substances. Your health care provider may do a physical exam and order lab tests or other studies. You may be referred to a mental health specialist. How is this treated? Treatment options for this condition include:  Counseling or talk therapy. Talk therapy is usually provided by mental health specialists.  Medicines. Certain medicines may help with depression, anxiety, and sleep.  Support groups. These offer emotional support, advice, and guidance. They are made up of people who have had similar experiences.  Observation and time. This is sometimes called "watchful waiting." In this treatment, health care providers monitor your health and behavior without other treatment. Adjustment disorder sometimes gets better on its own with time.  Follow these instructions at home:  Take over-the-counter and prescription medicines only as told by your health care provider.  Keep all follow-up visits as told by your health care provider. This is important. Contact a health care provider if:  Your symptoms do not improve in six months.  Your symptoms get worse. Get help right away if:  You have serious thoughts about hurting yourself or someone else. If you ever feel like you may hurt yourself or others, or have thoughts about taking your own life, get help right away. You can go to your nearest emergency department or call:  Your local emergency services (911 in the [place]  A suicide crisis helpline, such as the [facility] at (762) 705-9243. This is open 24 hours a day.  Summary  Adjustment disorder is a group of  symptoms that can develop after a stressful life event, such as the loss of a job or serious physical illness. The symptoms can affect how you feel, think, and act. They may interfere with your relationships.  Symptoms of this condition start within  three months of the stressful event. They do not last more than six months, unless the stressful circumstances last longer.  Treatment may include talk therapy, medicines, participation in a support group, or observation to see if symptoms improve.  Contact your health care provider if your symptoms get worse or do not improve in six months.  If you ever feel like you may hurt yourself or others, or have thoughts about taking your own life, get help right away. This information is not intended to replace advice given to you by your health care provider. Make sure you discuss any questions you have with your health care provider. Document Released: 04/01/2006 Document Revised: 09/26/2016 Document Reviewed: 09/26/2016 Elsevier Interactive Patient Education  2018 [employer]. Dizziness Dizziness is a common problem. It is a feeling of unsteadiness or light-headedness. You may feel like you are about to faint. Dizziness can lead to injury if you stumble or fall. Anyone can become dizzy, but dizziness is more common in older adults. This condition can be caused by a number of things, including medicines, dehydration, or illness. Follow these instructions at home: Eating and drinking  Drink enough fluid to keep your urine clear or pale yellow. This helps to keep you from becoming dehydrated. Try to drink more clear fluids, such as water.  Do not drink alcohol.  Limit your caffeine intake if told to do so by your health care provider. Check ingredients and nutrition facts to see if a food or beverage contains caffeine.  Limit your salt (sodium) intake if told to do so by your health care provider. Check ingredients and nutrition facts to see if a food or beverage contains sodium. Activity  Avoid making quick movements. ? Rise slowly from chairs and steady yourself until you feel okay. ? In the morning, first sit up on the side of the bed. When you feel okay, stand slowly while you hold onto  something until you know that your balance is fine.  If you need to stand in one place for a long time, move your legs often. Tighten and relax the muscles in your legs while you are standing.  Do not drive or use heavy machinery if you feel dizzy.  Avoid bending down if you feel dizzy. Place items in your home so that they are easy for you to reach without leaning over. Lifestyle  Do not use any products that contain nicotine or tobacco, such as cigarettes and e-cigarettes. If you need help quitting, ask your health care provider.  Try to reduce your stress level by using methods such as yoga or meditation. Talk with your health care provider if you need help to manage your stress. General instructions  Watch your dizziness for any changes.  Take over-the-counter and prescription medicines only as told by your health care provider. Talk with your health care provider if you think that your dizziness is caused by a medicine that you are taking.  Tell a friend or a family member that you are feeling dizzy. If he or she notices any changes in your behavior, have this person call your health care provider.  Keep all follow-up visits as told by your health care provider. This is important. Contact a health care provider  if:  Your dizziness does not go away.  Your dizziness or light-headedness gets worse.  You feel nauseous.  You have reduced hearing.  You have new symptoms.  You are unsteady on your feet or you feel like the room is spinning. Get help right away if:  You vomit or have diarrhea and are unable to eat or drink anything.  You have problems talking, walking, swallowing, or using your arms, hands, or legs.  You feel generally weak.  You are not thinking clearly or you have trouble forming sentences. It may take a friend or family member to notice this.  You have chest pain, abdominal pain, shortness of breath, or sweating.  Your vision changes.  You have any  bleeding.  You have a severe headache.  You have neck pain or a stiff neck.  You have a fever. These symptoms may represent a serious problem that is an emergency. Do not wait to see if the symptoms will go away. Get medical help right away. Call your local emergency services (911 in the [place] Do not drive yourself to the hospital. Summary  Dizziness is a feeling of unsteadiness or light-headedness. This condition can be caused by a number of things, including medicines, dehydration, or illness.  Anyone can become dizzy, but dizziness is more common in older adults.  Drink enough fluid to keep your urine clear or pale yellow. Do not drink alcohol.  Avoid making quick movements if you feel dizzy. Monitor your dizziness for any changes. This information is not intended to replace advice given to you by your health care provider. Make sure you discuss any questions you have with your health care provider. Document Released: 01/21/2001 Document Revised: 08/30/2016 Document Reviewed: 08/30/2016 Elsevier Interactive Patient Education  2018 [place] Maintenance, Female Adopting a healthy lifestyle and getting preventive care can go a long way to promote health and wellness. Talk with your health care provider about what schedule of regular examinations is right for you. This is a good chance for you to check in with your provider about disease prevention and staying healthy. In between checkups, there are plenty of things you can do on your own. Experts have done a lot of research about which lifestyle changes and preventive measures are most likely to keep you healthy. Ask your health care provider for more information. Weight and diet Eat a healthy diet  Be sure to include plenty of vegetables, fruits, low-fat dairy products, and lean protein.  Do not eat a lot of foods high in solid fats, added sugars, or salt.  Get regular exercise. This is one of the most important things  you can do for your health. ? Most adults should exercise for at least 150 minutes each week. The exercise should increase your heart rate and make you sweat (moderate-intensity exercise). ? Most adults should also do strengthening exercises at least twice a week. This is in addition to the moderate-intensity exercise.  Maintain a healthy weight  Body mass index (BMI) is a measurement that can be used to identify possible weight problems. It estimates body fat based on height and weight. Your health care provider can help determine your BMI and help you achieve or maintain a healthy weight.  For females 46 years of age and older: ? A BMI below 18.5 is considered underweight. ? A BMI of 18.5 to 24.9 is normal. ? A BMI of 25 to 29.9 is considered overweight. ? A BMI of 30 and above is  considered obese.  Watch levels of cholesterol and blood lipids  You should start having your blood tested for lipids and cholesterol at 44 years of age, then have this test every 5 years.  You may need to have your cholesterol levels checked more often if: ? Your lipid or cholesterol levels are high. ? You are older than 44 years of age. ? You are at high risk for heart disease.  Cancer screening Lung Cancer  Lung cancer screening is recommended for adults 39-82 years old who are at high risk for lung cancer because of a history of smoking.  A yearly low-dose CT scan of the lungs is recommended for people who: ? Currently smoke. ? Have quit within the past 15 years. ? Have at least a 30-pack-year history of smoking. A pack year is smoking an average of one pack of cigarettes a day for 1 year.  Yearly screening should continue until it has been 15 years since you quit.  Yearly screening should stop if you develop a health problem that would prevent you from having lung cancer treatment.  Breast Cancer  Practice breast self-awareness. This means understanding how your breasts normally appear and  feel.  It also means doing regular breast self-exams. Let your health care provider know about any changes, no matter how small.  If you are in your 20s or 30s, you should have a clinical breast exam (CBE) by a health care provider every 1-3 years as part of a regular health exam.  If you are 53 or older, have a CBE every year. Also consider having a breast X-ray (mammogram) every year.  If you have a family history of breast cancer, talk to your health care provider about genetic screening.  If you are at high risk for breast cancer, talk to your health care provider about having an MRI and a mammogram every year.  Breast cancer gene (BRCA) assessment is recommended for women who have family members with BRCA-related cancers. BRCA-related cancers include: ? Breast. ? Ovarian. ? Tubal. ? Peritoneal cancers.  Results of the assessment will determine the need for genetic counseling and BRCA1 and BRCA2 testing.  Cervical Cancer Your health care provider may recommend that you be screened regularly for cancer of the pelvic organs (ovaries, uterus, and vagina). This screening involves a pelvic examination, including checking for microscopic changes to the surface of your cervix (Pap test). You may be encouraged to have this screening done every 3 years, beginning at age 33.  For women ages 66-65, health care providers may recommend pelvic exams and Pap testing every 3 years, or they may recommend the Pap and pelvic exam, combined with testing for human papilloma virus (HPV), every 5 years. Some types of HPV increase your risk of cervical cancer. Testing for HPV may also be done on women of any age with unclear Pap test results.  Other health care providers may not recommend any screening for nonpregnant women who are considered low risk for pelvic cancer and who do not have symptoms. Ask your health care provider if a screening pelvic exam is right for you.  If you have had past treatment for  cervical cancer or a condition that could lead to cancer, you need Pap tests and screening for cancer for at least 20 years after your treatment. If Pap tests have been discontinued, your risk factors (such as having a new sexual partner) need to be reassessed to determine if screening should resume. Some women have medical  problems that increase the chance of getting cervical cancer. In these cases, your health care provider may recommend more frequent screening and Pap tests.  Colorectal Cancer  This type of cancer can be detected and often prevented.  Routine colorectal cancer screening usually begins at 44 years of age and continues through 44 years of age.  Your health care provider may recommend screening at an earlier age if you have risk factors for colon cancer.  Your health care provider may also recommend using home test kits to check for hidden blood in the stool.  A small camera at the end of a tube can be used to examine your colon directly (sigmoidoscopy or colonoscopy). This is done to check for the earliest forms of colorectal cancer.  Routine screening usually begins at age 60.  Direct examination of the colon should be repeated every 5-10 years through 44 years of age. However, you may need to be screened more often if early forms of precancerous polyps or small growths are found.  Skin Cancer  Check your skin from head to toe regularly.  Tell your health care provider about any new moles or changes in moles, especially if there is a change in a mole's shape or color.  Also tell your health care provider if you have a mole that is larger than the size of a pencil eraser.  Always use sunscreen. Apply sunscreen liberally and repeatedly throughout the day.  Protect yourself by wearing long sleeves, pants, a wide-brimmed hat, and sunglasses whenever you are outside.  Heart disease, diabetes, and high blood pressure  High blood pressure causes heart disease and increases  the risk of stroke. High blood pressure is more likely to develop in: ? People who have blood pressure in the high end of the normal range (130-139/85-89 mm Hg). ? People who are overweight or obese. ? People who are African American.  If you are 6-87 years of age, have your blood pressure checked every 3-5 years. If you are 52 years of age or older, have your blood pressure checked every year. You should have your blood pressure measured twice-once when you are at a hospital or clinic, and once when you are not at a hospital or clinic. Record the average of the two measurements. To check your blood pressure when you are not at a hospital or clinic, you can use: ? An automated blood pressure machine at a pharmacy. ? A home blood pressure monitor.  If you are between 74 years and 22 years old, ask your health care provider if you should take aspirin to prevent strokes.  Have regular diabetes screenings. This involves taking a blood sample to check your fasting blood sugar level. ? If you are at a normal weight and have a low risk for diabetes, have this test once every three years after 44 years of age. ? If you are overweight and have a high risk for diabetes, consider being tested at a younger age or more often. Preventing infection Hepatitis B  If you have a higher risk for hepatitis B, you should be screened for this virus. You are considered at high risk for hepatitis B if: ? You were born in a country where hepatitis B is common. Ask your health care provider which countries are considered high risk. ? Your parents were born in a high-risk country, and you have not been immunized against hepatitis B (hepatitis B vaccine). ? You have HIV or AIDS. ? You use needles to  inject street drugs. ? You live with someone who has hepatitis B. ? You have had sex with someone who has hepatitis B. ? You get hemodialysis treatment. ? You take certain medicines for conditions, including cancer, organ  transplantation, and autoimmune conditions.  Hepatitis C  Blood testing is recommended for: ? Everyone born from [birth year] through 1967. ? Anyone with known risk factors for hepatitis C.  Sexually transmitted infections (STIs)  You should be screened for sexually transmitted infections (STIs) including gonorrhea and chlamydia if: ? You are sexually active and are younger than 44 years of age. ? You are older than 44 years of age and your health care provider tells you that you are at risk for this type of infection. ? Your sexual activity has changed since you were last screened and you are at an increased risk for chlamydia or gonorrhea. Ask your health care provider if you are at risk.  If you do not have HIV, but are at risk, it may be recommended that you take a prescription medicine daily to prevent HIV infection. This is called pre-exposure prophylaxis (PrEP). You are considered at risk if: ? You are sexually active and do not regularly use condoms or know the HIV status of your partner(s). ? You take drugs by injection. ? You are sexually active with a partner who has HIV.  Talk with your health care provider about whether you are at high risk of being infected with HIV. If you choose to begin PrEP, you should first be tested for HIV. You should then be tested every 3 months for as long as you are taking PrEP. Pregnancy  If you are premenopausal and you may become pregnant, ask your health care provider about preconception counseling.  If you may become pregnant, take 400 to 800 micrograms (mcg) of folic acid every day.  If you want to prevent pregnancy, talk to your health care provider about birth control (contraception). Osteoporosis and menopause  Osteoporosis is a disease in which the bones lose minerals and strength with aging. This can result in serious bone fractures. Your risk for osteoporosis can be identified using a bone density scan.  If you are 22 years of age or  older, or if you are at risk for osteoporosis and fractures, ask your health care provider if you should be screened.  Ask your health care provider whether you should take a calcium or vitamin D supplement to lower your risk for osteoporosis.  Menopause may have certain physical symptoms and risks.  Hormone replacement therapy may reduce some of these symptoms and risks. Talk to your health care provider about whether hormone replacement therapy is right for you. Follow these instructions at home:  Schedule regular health, dental, and eye exams.  Stay current with your immunizations.  Do not use any tobacco products including cigarettes, chewing tobacco, or electronic cigarettes.  If you are pregnant, do not drink alcohol.  If you are breastfeeding, limit how much and how often you drink alcohol.  Limit alcohol intake to no more than 1 drink per day for nonpregnant women. One drink equals 12 ounces of beer, 5 ounces of wine, or 1 ounces of hard liquor.  Do not use street drugs.  Do not share needles.  Ask your health care provider for help if you need support or information about quitting drugs.  Tell your health care provider if you often feel depressed.  Tell your health care provider if you have ever been abused or  do not feel safe at home. This information is not intended to replace advice given to you by your health care provider. Make sure you discuss any questions you have with your health care provider. Document Released: 02/10/2011 Document Revised: 01/03/2016 Document Reviewed: 05/01/2015 Elsevier Interactive Patient Education  2018 [employer].  Preventing Unhealthy [employer], Adult Staying at a healthy weight is important. When fat builds up in your body, you may become overweight or obese. These conditions put you at greater risk for developing certain health problems, such as heart disease, diabetes, sleeping problems, joint problems, and some  cancers. Unhealthy weight gain is often the result of making unhealthy choices in what you eat. It is also a result of not getting enough exercise. You can make changes to your lifestyle to prevent obesity and stay as healthy as possible. What nutrition changes can be made? To maintain a healthy weight and prevent obesity:  Eat only as much as your body needs. To do this: ? Pay attention to signs that you are hungry or full. Stop eating as soon as you feel full. ? If you feel hungry, try drinking water first. Drink enough water so your urine is clear or pale yellow. ? Eat smaller portions. ? Look at serving sizes on food labels. Most foods contain more than one serving per container. ? Eat the recommended amount of calories for your gender and activity level. While most active people should eat around 2,000 calories per day, if you are trying to lose weight or are not very active, you main need to eat less calories. Talk to your health care provider or dietitian about how many calories you should eat each day.  Choose healthy foods, such as: ? Fruits and vegetables. Try to fill at least half of your plate at each meal with fruits and vegetables. ? Whole grains, such as whole wheat bread, brown rice, and quinoa. ? Lean meats, such as chicken or fish. ? Other healthy proteins, such as beans, eggs, or tofu. ? Healthy fats, such as nuts, seeds, fatty fish, and olive oil. ? Low-fat or fat-free dairy.  Check food labels and avoid food and drinks that: ? Are high in calories. ? Have added sugar. ? Are high in sodium. ? Have saturated fats or trans fats.  Limit how much you eat of the following foods: ? Prepackaged meals. ? Fast food. ? Fried foods. ? Processed meat, such as bacon, sausage, and deli meats. ? Fatty cuts of red meat and poultry with skin.  Cook foods in healthier ways, such as by baking, broiling, or grilling.  When grocery shopping, try to shop around the outside of the  store. This helps you buy mostly fresh foods and avoid canned and prepackaged foods.  What lifestyle changes can be made?  Exercise at least 30 minutes 5 or more days each week. Exercising includes brisk walking, yard work, biking, running, swimming, and team sports like basketball and soccer. Ask your health care provider which exercises are safe for you.  Do not use any products that contain nicotine or tobacco, such as cigarettes and e-cigarettes. If you need help quitting, ask your health care provider.  Limit alcohol intake to no more than 1 drink a day for nonpregnant women and 2 drinks a day for men. One drink equals 12 oz of beer, 5 oz of wine, or 1 oz of hard liquor.  Try to get 7-9 hours of sleep each night. What other changes can be made?  Keep a food and activity journal to keep track of: ? What you ate and how many calories you had. Remember to count sauces, dressings, and side dishes. ? Whether you were active, and what exercises you did. ? Your calorie, weight, and activity goals.  Check your weight regularly. Track any changes. If you notice you have gained weight, make changes to your diet or activity routine.  Avoid taking weight-loss medicines or supplements. Talk to your health care provider before starting any new medicine or supplement.  Talk to your health care provider before trying any new diet or exercise plan. Why are these changes important? Eating healthy, staying active, and having healthy habits not only help prevent obesity, they also:  Help you to manage stress and emotions.  Help you to connect with friends and family.  Improve your self-esteem.  Improve your sleep.  Prevent long-term health problems.  What can happen if changes are not made? Being obese or overweight can cause you to develop joint or bone problems, which can make it hard for you to stay active or do activities you enjoy. Being obese or overweight also puts stress on your heart  and lungs and can lead to health problems like diabetes, heart disease, and some cancers. Where to find more information: Talk with your health care provider or a dietitian about healthy eating and healthy lifestyle choices. You may also find other information through these resources:  [place] MyPlate: FormerBoss.no  American Heart Association: www.heart.org  Centers for Disease Control and Prevention: http://www.wolf.info/  Summary  Staying at a healthy weight is important. It helps prevent certain diseases and health problems, such as heart disease, diabetes, joint problems, sleep disorders, and some cancers.  Being obese or overweight can cause you to develop joint or bone problems, which can make it hard for you to stay active or do activities you enjoy.  You can prevent unhealthy weight gain by eating a healthy diet, exercising regularly, not smoking, limiting alcohol, and getting enough sleep.  Talk with your health care provider or a dietitian for guidance about healthy eating and healthy lifestyle choices. This information is not intended to replace advice given to you by your health care provider. Make sure you discuss any questions you have with your health care provider. Document Released: 07/29/2016 Document Revised: 09/03/2016 Document Reviewed: 09/03/2016 Elsevier Interactive Patient Education  [employer].

## 2018-01-26 NOTE — Telephone Encounter (Signed)
Pt returning to call to office. Pt notified of recommendations of Dr. Ethelene Hal per MyChart message on 01/26/18. Pt verbalized understanding and states she will go to the hospital.

## 2018-01-26 NOTE — Addendum Note (Signed)
Addended by: Jon Billings on: 01/26/2018 01:53 PM   Modules accepted: Level of Service

## 2018-01-26 NOTE — ED Triage Notes (Addendum)
Patient reports she was seen at PCP for routine check today and called for abnormal hemoglobin 7.7. Hx breast cancer. Reports fatigue worsening yesterday. Denies chest pain and SOB.

## 2018-01-27 LAB — URINALYSIS, ROUTINE W REFLEX MICROSCOPIC
BILIRUBIN URINE: NEGATIVE
GLUCOSE, UA: NEGATIVE mg/dL
Hgb urine dipstick: NEGATIVE
Ketones, ur: NEGATIVE mg/dL
LEUKOCYTES UA: NEGATIVE
Nitrite: NEGATIVE
PH: 6 (ref 5.0–8.0)
Protein, ur: NEGATIVE mg/dL
SPECIFIC GRAVITY, URINE: 1.016 (ref 1.005–1.030)

## 2018-01-27 LAB — IRON AND TIBC
IRON: 18 ug/dL — AB (ref 28–170)
Saturation Ratios: 4 % — ABNORMAL LOW (ref 10.4–31.8)
TIBC: 472 ug/dL — ABNORMAL HIGH (ref 250–450)
UIBC: 454 ug/dL

## 2018-01-27 LAB — VITAMIN B12: Vitamin B-12: 617 pg/mL (ref 180–914)

## 2018-01-27 LAB — FOLATE: FOLATE: 9.3 ng/mL (ref >5.9)

## 2018-01-27 LAB — ABO/RH: ABO/RH(D): A POS

## 2018-01-27 LAB — FERRITIN: FERRITIN: 3 ng/mL — AB (ref 11–307)

## 2018-01-27 LAB — PREPARE RBC (CROSSMATCH)

## 2018-01-27 MED ORDER — FERROUS SULFATE 325 (65 FE) MG PO TABS: 325.0000 mg | ORAL_TABLET | Freq: Every day | ORAL | 0 refills | Status: AC

## 2018-01-27 NOTE — ED Notes (Signed)
Error with validation of vitals at 0344. Deleted bad data. Vitals to be re-evaluated.

## 2018-01-27 NOTE — Discharge Instructions (Signed)
You were found to have a low hemoglobin level during office blood work.  You were sent to the ED for a blood transfusion.  You received 2 units of blood while in the ED.  We advise follow-up with your primary care doctor for recheck of your CBC in 1 week.  Your primary care doctor should also further evaluate the cause of your anemia.  You may benefit from follow-up with a hematologist/oncologist for this as well.  Take a daily iron supplement to try and prevent persistent anemia.  Otherwise, continue your prescribed medications.  You may return to the ED for new or concerning symptoms.

## 2018-01-27 NOTE — ED Notes (Signed)
MD made aware of pts blood transfusion status. Transfusion almost complete

## 2018-01-28 ENCOUNTER — Telehealth: Payer: Self-pay | Admitting: Family Medicine

## 2018-01-28 LAB — TYPE AND SCREEN
ABO/RH(D): A POS
Antibody Screen: NEGATIVE
Unit division: 0
Unit division: 0

## 2018-01-28 LAB — BPAM RBC
Blood Product Expiration Date: 201907052359
Blood Product Expiration Date: 201907092359
ISSUE DATE / TIME: 201906190221
ISSUE DATE / TIME: 201906190554
Unit Type and Rh: 6200
Unit Type and Rh: 6200

## 2018-01-28 MED ORDER — FERROUS SULFATE 325 (65 FE) MG PO TBEC: 325.0000 mg | DELAYED_RELEASE_TABLET | Freq: Two times a day (BID) | ORAL | 1 refills | Status: AC

## 2018-01-28 NOTE — Addendum Note (Signed)
Addended by: Jon Billings on: 01/28/2018 08:18 AM   Modules accepted: Orders

## 2018-01-28 NOTE — Telephone Encounter (Signed)
Copied from [place] [id number]. Topic: Quick Communication - Lab Results >> Jan 28, 2018  2:53 PM Self, Rande Brunt, CMA wrote: Called patient to inform them of her lab results. When patient returns call, triage nurse may disclose results.

## 2018-01-29 NOTE — Telephone Encounter (Signed)
Left message to call back to review results.

## 2018-02-03 NOTE — Telephone Encounter (Signed)
Pt has returned call for lab results but no information seen in the results on what needs to be relayed to the pt.

## 2018-02-03 NOTE — Telephone Encounter (Signed)
Pt is aware of the test result on 01/26/18

## 2018-02-03 NOTE — Telephone Encounter (Signed)
Returning call.

## 2018-03-29 ENCOUNTER — Ambulatory Visit: Payer: PRIVATE HEALTH INSURANCE | Admitting: Family Medicine

## 2018-04-05 ENCOUNTER — Other Ambulatory Visit: Payer: Self-pay

## 2018-04-05 DIAGNOSIS — I10 Essential (primary) hypertension: Secondary | ICD-10-CM

## 2018-04-05 DIAGNOSIS — T451X5A Adverse effect of antineoplastic and immunosuppressive drugs, initial encounter: Principal | ICD-10-CM

## 2018-04-05 DIAGNOSIS — I427 Cardiomyopathy due to drug and external agent: Secondary | ICD-10-CM

## 2018-04-05 MED ORDER — CARVEDILOL 12.5 MG PO TABS: 12.5000 mg | ORAL_TABLET | Freq: Two times a day (BID) | ORAL | 0 refills | Status: DC

## 2018-04-20 ENCOUNTER — Ambulatory Visit: Payer: PRIVATE HEALTH INSURANCE | Admitting: Diagnostic Neuroimaging

## 2018-06-13 ENCOUNTER — Encounter: Payer: Self-pay | Admitting: Genetic Counselor

## 2018-06-20 ENCOUNTER — Other Ambulatory Visit: Payer: Self-pay | Admitting: Family Medicine

## 2018-06-20 DIAGNOSIS — I427 Cardiomyopathy due to drug and external agent: Secondary | ICD-10-CM

## 2018-06-20 DIAGNOSIS — I10 Essential (primary) hypertension: Secondary | ICD-10-CM

## 2018-06-20 DIAGNOSIS — T451X5A Adverse effect of antineoplastic and immunosuppressive drugs, initial encounter: Principal | ICD-10-CM

## 2018-07-26 ENCOUNTER — Encounter (HOSPITAL_COMMUNITY): Payer: Self-pay | Admitting: *Deleted

## 2023-06-05 LAB — COLOGUARD: COLOGUARD: NEGATIVE

## 2023-08-24 ENCOUNTER — Encounter: Payer: Self-pay | Admitting: Genetic Counselor
